# Patient Record
Sex: Male | Born: 1960 | Race: White | Hispanic: No | Marital: Married | Smoking: Never smoker
Health system: Southern US, Community
[De-identification: ages and names within clinical notes are randomized; demographics above are authoritative.]

## PROBLEM LIST (undated history)

## (undated) DIAGNOSIS — K859 Acute pancreatitis without necrosis or infection, unspecified: Secondary | ICD-10-CM

## (undated) DIAGNOSIS — I509 Heart failure, unspecified: Secondary | ICD-10-CM

## (undated) DIAGNOSIS — J45909 Unspecified asthma, uncomplicated: Secondary | ICD-10-CM

## (undated) DIAGNOSIS — E1110 Type 2 diabetes mellitus with ketoacidosis without coma: Secondary | ICD-10-CM

## (undated) DIAGNOSIS — G510 Bell's palsy: Secondary | ICD-10-CM

## (undated) DIAGNOSIS — E119 Type 2 diabetes mellitus without complications: Secondary | ICD-10-CM

## (undated) DIAGNOSIS — I2510 Atherosclerotic heart disease of native coronary artery without angina pectoris: Secondary | ICD-10-CM

## (undated) HISTORY — PX: TRIGGER FINGER RELEASE: SHX641

## (undated) HISTORY — PX: CARDIAC CATHETERIZATION: SHX172

## (undated) HISTORY — PX: CORONARY ANGIOPLASTY WITH STENT PLACEMENT: SHX49

## (undated) HISTORY — PX: ROTATOR CUFF REPAIR: SHX139

---

## 1998-01-19 ENCOUNTER — Encounter: Payer: Self-pay | Admitting: Anesthesiology

## 1998-01-19 ENCOUNTER — Encounter: Admission: RE | Admit: 1998-01-19 | Discharge: 1998-04-07 | Payer: Self-pay | Admitting: Anesthesiology

## 1998-03-30 ENCOUNTER — Encounter: Payer: Self-pay | Admitting: Anesthesiology

## 1998-04-04 ENCOUNTER — Encounter: Admission: RE | Admit: 1998-04-04 | Discharge: 1998-07-03 | Payer: Self-pay | Admitting: Anesthesiology

## 1998-04-07 ENCOUNTER — Encounter: Admission: RE | Admit: 1998-04-07 | Discharge: 1998-07-06 | Payer: Self-pay | Admitting: Anesthesiology

## 1998-04-14 ENCOUNTER — Encounter: Admission: RE | Admit: 1998-04-14 | Discharge: 1998-07-13 | Payer: Self-pay | Admitting: Anesthesiology

## 1998-07-28 ENCOUNTER — Encounter: Admission: RE | Admit: 1998-07-28 | Discharge: 1998-10-26 | Payer: Self-pay | Admitting: Anesthesiology

## 1998-09-16 ENCOUNTER — Encounter: Payer: Self-pay | Admitting: Anesthesiology

## 1998-10-26 ENCOUNTER — Encounter: Admission: RE | Admit: 1998-10-26 | Discharge: 1999-01-20 | Payer: Self-pay | Admitting: Anesthesiology

## 1998-10-26 ENCOUNTER — Encounter: Payer: Self-pay | Admitting: Anesthesiology

## 1999-01-20 ENCOUNTER — Encounter: Admission: RE | Admit: 1999-01-20 | Discharge: 1999-04-07 | Payer: Self-pay | Admitting: Anesthesiology

## 1999-05-25 ENCOUNTER — Encounter: Admission: RE | Admit: 1999-05-25 | Discharge: 1999-08-23 | Payer: Self-pay | Admitting: Anesthesiology

## 1999-11-13 ENCOUNTER — Encounter: Admission: RE | Admit: 1999-11-13 | Discharge: 2000-02-11 | Payer: Self-pay | Admitting: Anesthesiology

## 2000-03-27 ENCOUNTER — Encounter: Admission: RE | Admit: 2000-03-27 | Discharge: 2000-06-25 | Payer: Self-pay | Admitting: Anesthesiology

## 2000-07-23 ENCOUNTER — Encounter: Admission: RE | Admit: 2000-07-23 | Discharge: 2000-09-21 | Payer: Self-pay | Admitting: Anesthesiology

## 2006-12-23 ENCOUNTER — Encounter: Admission: RE | Admit: 2006-12-23 | Discharge: 2007-01-22 | Payer: Self-pay | Admitting: Internal Medicine

## 2007-01-24 ENCOUNTER — Encounter: Admission: RE | Admit: 2007-01-24 | Discharge: 2007-03-03 | Payer: Self-pay | Admitting: Internal Medicine

## 2007-05-30 ENCOUNTER — Ambulatory Visit (HOSPITAL_BASED_OUTPATIENT_CLINIC_OR_DEPARTMENT_OTHER): Admission: RE | Admit: 2007-05-30 | Discharge: 2007-05-30 | Payer: Self-pay | Admitting: Orthopaedic Surgery

## 2009-01-25 ENCOUNTER — Encounter: Admission: RE | Admit: 2009-01-25 | Discharge: 2009-01-25 | Payer: Self-pay | Admitting: Otolaryngology
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
## 2010-06-09 NOTE — H&P (Signed)
[facility]  Patient:    Brent Rasmussen, Brent Rasmussen                     MRN: [id number] Adm. Date:  [id number] Attending:  Thyra Breed CC:         Marinus Maw, M.D.   History and Physical  FOLLOWUP EVALUATION  Brent Rasmussen comes in for followup evaluation of his chronic neck pain on the basis of cervical spondylosis.  Since his previous evaluation, his pain has been relatively stable.  He has had a lot of problems with his appetite and has been switched from Glucophage short acting to long acting.  He has gone off of his Claritin and this actually has helped with his appetite somewhat.  He continues to have intermittent flare ups, although they are much less frequently with headaches and does not feel as though the Percocet is as helpful.  He has been dropped off of the Remeron and continues with the Provigil.  He is very concerned about the cost of his medications.  CURRENT MEDICATIONS:  Provigil, Glucotrol XL, Glucophage XR, Actos, TriCor, OxyContin 20 mg three times a day, Percocet p.r.n., and Naprosyn.  PHYSICAL EXAMINATION:  VITAL SIGNS:  Blood pressure 148/75, heart rate 108, respiratory rate 20, O2 saturation 96%, pain level 5/10.  NECK:  Range of motion is unchanged from previously.  EXTREMITIES:  Deep tendon reflexes were symmetric in the upper extremities. NEUROLOGIC:  Motor is 5/5.  IMPRESSION: 1. Occipital neuralgia with underlying cervical spondylosis with associated    headaches in the occipital region. 2. Other medical problems per Dr. Oneta Rack.  DISPOSITION: 1. OxyContin 20 mg one p.o. q.8h. #90 with no refill. 2. Vioxx 25 mg per day.  He was given samples of this. 3. Lidoderm patches p.r.n. 4. Follow up with me in eight weeks. 5. Continue other medications per Dr. Oneta Rack. DD:  12/18/99 TD:  12/18/99 Job: [id number]

## 2010-06-09 NOTE — Consult Note (Signed)
[facility]  Patient:    Brent Rasmussen, Brent Rasmussen                     MRN: [id number] Proc. Date: 08/14/99 Adm. Date:  [id number] Attending:  Thyra Breed CC:         Brent Rasmussen, M.D.                          Consultation Report  FOLLOW-UP EVALUATION  HISTORY OF PRESENT ILLNESS:  [name] comes in for follow-up evaluation of his right sided headaches on the basis of cervical spondylosis. Since his previous evaluation, the patient has begun seeing Dr. Oneta Rasmussen for his diabetes. He has had Avandia added. He continues to have a lot of neck discomfort and he is trying to shift his OxyContin to b.i.d. rather than t.i.d. but he does note that he has hand pain when he spreads it out. He continues on his other medications unchanged from previously. These include Remeron, Provigil, Glucotrol, glucophage, Avandia, claritin D, OxyContin 20 mg now t.i.d., Percocet p.r.n., nasal spray, pulmicort and glucosamine with Ambien.  PHYSICAL EXAMINATION:  VITAL SIGNS:  Blood pressure is 129/86, heart rate is 100, respiratory rate 16, O2 saturations 96%, pain level is 5/10 and temperature is 98.4.  NECK:  Demonstrates good range of motion with pain on rotation to the right and tenderness to a lesser extent on the right side. Deep tendon reflexes are symmetric in the upper and lower extremity. He has negative Phalen signs and Tinel signs.  IMPRESSION: 1. Occipital neuralgia with underlying cervical spondylosis and associated    headaches. 2. Other medical problems per Dr. Karilyn Rasmussen.  DISPOSITION: 1. Continue on current medications. 2. Mobic 7.5 mg 1 p.o. q.d. #30 with 2 refills. 3. He does not need prescriptions for OxyContin or Percocet today. 4. Followup with me in 3 months. I advised him that if he does not feel    as though the ______ was helping that he could try and go on off of this.    We will hold off on injections for the meantime. DD:  08/14/99 TD:   08/17/99 Job: [id number]

## 2010-06-09 NOTE — H&P (Signed)
Brent Rasmussen  Patient:    Brent Rasmussen                     MRN: [id number] Adm. Date:  [id number] Attending:  Thyra Breed CC:         Marinus Maw, M.D.   History and Physical  HISTORY OF PRESENT ILLNESS:  Brent Rasmussen comes in for followup evaluation of his chronic neck pain on the basis of cervical spondylosis.  Since his previous evaluation, the patient has not noticed a lot of improvement.  He continues on the Roxicet, which he takes up to 1-3 tablets per day, OxyContin 20 mg three times a day, Claritin, Glucotrol, Glucophage, Actos, Tylenol, and Bextra.  He rates his pain at 4/10.  He continues to have pain in the right side of his neck radiating out to his right shoulder.  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  PHYSICAL EXAMINATION:  VITAL SIGNS:  Blood pressure 116/70, heart rate 102, respiratory rate 18.  O2 saturation 97%.  Pain level is 4/10.  NEUROLOGIC:  He demonstrates pain on range of motion of his neck rotating to the right.  Deep tendon reflexes are symmetric in the upper extremities.  He is tender over the posterior aspect of the neck on the right side.  ______ no active synovitis.  He does have an excoriation of his right lower extremity with some erythema about the scab.  IMPRESSION: 1. Cervical spondylosis with occipital neuralgia. 2. Arthralgias of the hands. 3. Other medical problems per Dr. Oneta Rasmussen.  DISPOSITION: 1. Stop OxyContin. 2. Continue with Bextra 10 mg one p.o. q.d. 3. Continue with Lidoderm. 4. Duragesic 50 mcg patch apply one every three days for a trial of two    weeks.  If he is doing well, we will continue at this dose. 5. Percocet 5/325 one to two p.o. q.6h. p.r.n. #180 with no refill. 6. Follow-up with me in six weeks. DD:  06/14/00 TD:  06/15/00 Job: [id number]

## 2010-06-09 NOTE — H&P (Signed)
[facility]  Patient:    Brent Rasmussen, Brent Rasmussen                     MRN: [id number] Proc. Date: 07/24/00 Adm. Date:  [id number] Attending:  Thyra Breed CC:         Dr. Charlotte Sanes   History and Physical  FOLLOW-UP EVALUATION:  HISTORY OF PRESENT ILLNESS:  The patient comes in for followup evaluation of his chronic neck pain on the basis of cervical spondylosis.  The patient is doing much better on the Duragesic down to 1-2 tablets of Percocet per day. Unfortunately he did drop a punch board on his right foot and has some lacerations which occurred two weeks ago, which are improving with no purulent drainage.  He is tolerating the Claritin, Glucotrol, Glucophage, Actos, Bextra, Duragesic and Percocet combination fairly well.  He notes that the Aciphex is helping.  EXAMINATION:  Blood pressure 141/86, heart rate is 107, respiratory rate is 16, O2 saturation is 97%.  Pain level is 4/10.  Neurologic examination is grossly unchanged.  IMPRESSION: 1. Cervical spondylosis with occipital neuralgia. 2. Arthralgia of the hands. 3. Other medical problems per Dr. Charlotte Sanes.  DISPOSITION: 1. Continue on the Duragesic patch 75 mcg every 3 days, #10. 2. Continue with Bextra 10 mg per day.  He was given 2 months samples. 3. Percocet 5/325 1-2 p.o. q.6h. p.r.n., #150 with no refill. 4. Continue with Aciphex.  He was given samples to last him for 2 months    of this also. 5. Followup with me in 6 weeks. DD:  07/24/00 TD:  07/24/00 Job: [id number]

## 2010-06-09 NOTE — H&P (Signed)
[facility]  Patient:    Brent Rasmussen, Brent Rasmussen                     MRN: [id number] Adm. Date:  [id number] Attending:  Thyra Breed CC:         Lilly Cove, M.D.             Dr. Doug Sou                         History and Physical  Patient comes in for follow-up evaluation.  Since his last evaluation, the patient has had his medications adjusted, such that he is taking Remeron at bedtime and he has had _________ added.  He has had three bouts of really bad headaches since his last evaluation which eventually responded to the Percocet.  He has been able to get by with OxyContin twice a day on a regular basis.  He is not taking a nonsteroidal, as he feels that the glucosamine fish oil and pyridoxine combination helps him significantly.  Today he is doing fairly well overall.  PHYSICAL EXAMINATION:  VITAL SIGNS:                  The patient is afebrile.  Vital signs are stable.  HEENT:                        He exhibits tumors over his right greater occipital groove.  NEUROLOGIC:                   Grossly unchanged.  CURRENT MEDICATIONS:           1. Remeron 45 mg q.h.s.                                2. _________ 400 mg a day.                                3. Glucotrol 20 mg per day.                                4. Glucophage 1000 mg b.i.d.                                5. Claritin D.                                6. OxyContin 20 mg b.i.d.                                7. Percocet p.r.n.                                8. Astelin nasal spray.                                9. Pulmicort.  10. _________ Glucosamine, and Ambien.  IMPRESSION:                   1. Headaches and neck discomfort on the basis of                                  cervical spondylosis.                               2. Other medical problems per Dr. _________ and                                  Doug Sou.  DISPOSITION:                  1.  Continue on current medications.  He did not                                  need prescriptions today.                               2. I went ahead and injected his right greater                                  occipital region after prepping with Betadine                                  x 3 and obtain verbal consent.  I injected it                                  with 1 cc of 1% lidocaine with 2 cc of 0.5%                                  levobupivicaine and 10 mg of Medrol.  The                                  patient noted immediate improvement.                               3. Follow up with me in eight weeks.  He is to                                  call if he runs into problems between now and                                  his next appointment.DD:  05/25/99 TD:  05/25/99 Job: [id number]

## 2010-06-09 NOTE — H&P (Signed)
[facility]  Patient:    Brent Rasmussen, Brent Rasmussen                     MRN: [id number] Adm. Date:  [id number] Attending:  Thyra Breed CC:         Marinus Maw, M.D.   History and Physical  This is a followup evaluation.  HISTORY OF PRESENT ILLNESS:  Zaeem comes in for followup evaluation of his cervical spondylosis and polyarthralgias of the small joints of his hands. Since his last evaluation, his basic complaint is his hand pain.  He complains of stiffness in the mornings lasting at least an hour and intermittent swelling of the small joints of his hands.  He does not feel as though the Bextra has been of any benefit in reducing his discomfort.  He also complains of foot discomfort to a lesser extent.  CURRENT MEDICATIONS: 1. Duragesic 75 mcg every 3 days, which he feels only holds him for two days. 2. Prevacid. 3. Aciphex. 4. Bextra.  PHYSICAL EXAMINATION:  VITAL SIGNS:  Blood pressure 158/83, heart rate 107, respiratory rate 18, O2 saturations 97%, pain level 7/10.  NEUROLOGIC:  He demonstrates tenderness of the small joints of hands, but no discrete swelling.  He has good range of motion of his hands, elbows, wrists, and shoulders, as well as the feet.  Neuro examination is grossly unchanged.  IMPRESSION: 1. Polyarthralgias of undetermined etiology. 2. Cervical spondylosis. 3. Dysesthesias likely related to his underlying diabetes. 4. Other medical problems per Dr. Oneta Rack.  DISPOSITION: 1. Continue on Duragesic patch, but increase frequency to every two days #15    with no refill. 2. Stop Bextra. 3. Continue with Percocet. 4. Continue with Aciphex. 5. Arthritis panel with CBC today. 6. Follow up with me in four to six weeks. DD:  09/04/00 TD:  09/04/00 Job: [id number]

## 2010-06-09 NOTE — Consult Note (Signed)
[facility]  Patient:    Brent Rasmussen, Brent Rasmussen                     MRN: [id number] Proc. Date: 04/25/00 Adm. Date:  [id number] Attending:  Thyra Breed CC:         Marinus Maw, M.D.   Consultation Report  FOLLOW-UP EVALUATION  [name] comes in for follow-up today.  He continues to have pain in his hands, feet, and knees which he attributes significantly to more articular pain.  He has not noticed a great deal of change with regard to his head or headaches and neck.  He is currently on Claritin, Roxicet, Glucotrol, Gabitril, OxyContin 20 mg 3 times a day, Ambien, Prilosec, Actos, Pulmicort, and Maxair.  PHYSICAL EXAMINATION:  Blood pressure is 142/80.  Heart rate is 110, respiratory rates 18, O2 saturations 97%.  Pain level is 6/10.  Range of motion of his neck is unchanged.  Examination of his hands and knees reveals no signs of active synovitis today.  Deep tendon reflexes are symmetric in the upper and lower extremities.  IMPRESSION: 1. Occipital neuralgia with underlying cervical spondylosis. 2. Arthralgias of the hands. 3. Other medical problems per Dr. Oneta Rack.  DISPOSITION: 1. Continue on OxyContin 20 mg 1 p.o. q.8h. #90 with no refill. 2. Stop Vioxx. 3. Trial of ______ 10 mg 1 p.o. q.d. 4. Continue with Lidoderm. 5. Roxicodone 15 mg 1 p.o. q.6h. p.r.n. breakthrough pain #100. 6. Follow up with me in eight weeks. DD:  04/25/00 TD:  04/25/00 Job: [id number]

## 2010-06-09 NOTE — H&P (Signed)
[facility]  Patient:    Brent Rasmussen, Brent Rasmussen                     MRN: [id number] Adm. Date:  [id number] Attending:  Thyra Breed CC:         Marinus Maw, M.D.   History and Physical  FOLLOW-UP EVALUATION:  Brent Rasmussen comes in for follow-up evaluation of his neck pain and headaches. Since his last evaluation, his sugars have continued to be difficult to control, and he is worried about going on insulin. He has been started on Lipitor.  He is not sleeping well, despite using the Phenergan at night.  He does not note that the hydrocodone is very helpful but does note that the OxyContin is helping him.  PHYSICAL EXAMINATION:  VITAL SIGNS:  Blood pressure 138/81, heart rate is 115, respiratory rate is 20, O2 saturation is 97%, pain level is 4/10.  NEUROLOGICAL:  Range of motion of his neck is stable with no deterioration from his last visit. He has intact deep tendon reflexes in the upper extremities with strength 5/5. Sensation is intact. He is tender over his right greater occipital region.  CURRENT MEDICATIONS: 1. OxyContin 20 mg q.8h. 2. Hydrocodone 5/500 p.r.n. 3. Vioxx. 4. Glucotrol. 5. Glucophage. 6. Actos. 7. Tricor. 8. Lipitor. 9. Prilosec.  IMPRESSION: 1. Occipital neuralgia with underlying cervical spondylosis. 2. Other medical problems per Marinus Maw, M.D.  DISPOSITION: 1. Continue on OxyContin 20 mg one p.o. q.8h. p.r.n. breakthrough pain. 2. Continue Vioxx. 3. Continue with Lidoderm patches, #30 with two refills. 4. Dilaudid 2 mg one to two p.o. q.4-6h. p.r.n. breakthrough pain, #50 with no    refill. 5. Other medications per Marinus Maw, M.D. 6. Follow up with me in 8 weeks.  The patient was advised that he may not be able to work if he takes the Dilaudid. DD:  02/05/00 TD:  02/05/00 Job: [id number]

## 2014-12-14 ENCOUNTER — Emergency Department (HOSPITAL_BASED_OUTPATIENT_CLINIC_OR_DEPARTMENT_OTHER): Payer: BLUE CROSS/BLUE SHIELD

## 2014-12-14 ENCOUNTER — Encounter (HOSPITAL_BASED_OUTPATIENT_CLINIC_OR_DEPARTMENT_OTHER): Payer: Self-pay | Admitting: *Deleted

## 2014-12-14 ENCOUNTER — Inpatient Hospital Stay (HOSPITAL_BASED_OUTPATIENT_CLINIC_OR_DEPARTMENT_OTHER)
Admission: EM | Admit: 2014-12-14 | Discharge: 2015-01-04 | DRG: 438 | Disposition: A | Discharge status: Home / Self Care | Payer: BLUE CROSS/BLUE SHIELD | Attending: Internal Medicine | Admitting: Internal Medicine

## 2014-12-14 DIAGNOSIS — I5023 Acute on chronic systolic (congestive) heart failure: Secondary | ICD-10-CM | POA: Diagnosis not present

## 2014-12-14 DIAGNOSIS — Z23 Encounter for immunization: Secondary | ICD-10-CM

## 2014-12-14 DIAGNOSIS — I214 Non-ST elevation (NSTEMI) myocardial infarction: Secondary | ICD-10-CM | POA: Diagnosis not present

## 2014-12-14 DIAGNOSIS — R6521 Severe sepsis with septic shock: Secondary | ICD-10-CM | POA: Diagnosis not present

## 2014-12-14 DIAGNOSIS — E1110 Type 2 diabetes mellitus with ketoacidosis without coma: Secondary | ICD-10-CM | POA: Diagnosis present

## 2014-12-14 DIAGNOSIS — E781 Pure hyperglyceridemia: Secondary | ICD-10-CM | POA: Diagnosis present

## 2014-12-14 DIAGNOSIS — K5900 Constipation, unspecified: Secondary | ICD-10-CM | POA: Diagnosis present

## 2014-12-14 DIAGNOSIS — I130 Hypertensive heart and chronic kidney disease with heart failure and stage 1 through stage 4 chronic kidney disease, or unspecified chronic kidney disease: Secondary | ICD-10-CM | POA: Diagnosis present

## 2014-12-14 DIAGNOSIS — Z01818 Encounter for other preprocedural examination: Secondary | ICD-10-CM

## 2014-12-14 DIAGNOSIS — IMO0002 Reserved for concepts with insufficient information to code with codable children: Secondary | ICD-10-CM | POA: Diagnosis present

## 2014-12-14 DIAGNOSIS — E860 Dehydration: Secondary | ICD-10-CM | POA: Diagnosis present

## 2014-12-14 DIAGNOSIS — D696 Thrombocytopenia, unspecified: Secondary | ICD-10-CM | POA: Diagnosis not present

## 2014-12-14 DIAGNOSIS — I472 Ventricular tachycardia: Secondary | ICD-10-CM | POA: Diagnosis not present

## 2014-12-14 DIAGNOSIS — J9601 Acute respiratory failure with hypoxia: Secondary | ICD-10-CM | POA: Diagnosis not present

## 2014-12-14 DIAGNOSIS — E118 Type 2 diabetes mellitus with unspecified complications: Secondary | ICD-10-CM | POA: Diagnosis not present

## 2014-12-14 DIAGNOSIS — E669 Obesity, unspecified: Secondary | ICD-10-CM | POA: Diagnosis present

## 2014-12-14 DIAGNOSIS — I255 Ischemic cardiomyopathy: Secondary | ICD-10-CM | POA: Diagnosis present

## 2014-12-14 DIAGNOSIS — R7989 Other specified abnormal findings of blood chemistry: Secondary | ICD-10-CM | POA: Diagnosis not present

## 2014-12-14 DIAGNOSIS — I2583 Coronary atherosclerosis due to lipid rich plaque: Secondary | ICD-10-CM | POA: Diagnosis present

## 2014-12-14 DIAGNOSIS — E1165 Type 2 diabetes mellitus with hyperglycemia: Secondary | ICD-10-CM | POA: Diagnosis present

## 2014-12-14 DIAGNOSIS — A419 Sepsis, unspecified organism: Secondary | ICD-10-CM | POA: Diagnosis not present

## 2014-12-14 DIAGNOSIS — E871 Hypo-osmolality and hyponatremia: Secondary | ICD-10-CM | POA: Diagnosis present

## 2014-12-14 DIAGNOSIS — Z955 Presence of coronary angioplasty implant and graft: Secondary | ICD-10-CM

## 2014-12-14 DIAGNOSIS — E870 Hyperosmolality and hypernatremia: Secondary | ICD-10-CM | POA: Diagnosis not present

## 2014-12-14 DIAGNOSIS — D509 Iron deficiency anemia, unspecified: Secondary | ICD-10-CM | POA: Diagnosis not present

## 2014-12-14 DIAGNOSIS — Z9289 Personal history of other medical treatment: Secondary | ICD-10-CM

## 2014-12-14 DIAGNOSIS — I248 Other forms of acute ischemic heart disease: Secondary | ICD-10-CM | POA: Diagnosis present

## 2014-12-14 DIAGNOSIS — N179 Acute kidney failure, unspecified: Secondary | ICD-10-CM | POA: Diagnosis present

## 2014-12-14 DIAGNOSIS — I25119 Atherosclerotic heart disease of native coronary artery with unspecified angina pectoris: Secondary | ICD-10-CM | POA: Diagnosis not present

## 2014-12-14 DIAGNOSIS — E873 Alkalosis: Secondary | ICD-10-CM | POA: Diagnosis not present

## 2014-12-14 DIAGNOSIS — R778 Other specified abnormalities of plasma proteins: Secondary | ICD-10-CM | POA: Diagnosis present

## 2014-12-14 DIAGNOSIS — Z79899 Other long term (current) drug therapy: Secondary | ICD-10-CM | POA: Diagnosis not present

## 2014-12-14 DIAGNOSIS — R109 Unspecified abdominal pain: Secondary | ICD-10-CM | POA: Diagnosis present

## 2014-12-14 DIAGNOSIS — Z4659 Encounter for fitting and adjustment of other gastrointestinal appliance and device: Secondary | ICD-10-CM | POA: Diagnosis not present

## 2014-12-14 DIAGNOSIS — T502X5A Adverse effect of carbonic-anhydrase inhibitors, benzothiadiazides and other diuretics, initial encounter: Secondary | ICD-10-CM | POA: Diagnosis not present

## 2014-12-14 DIAGNOSIS — K859 Acute pancreatitis without necrosis or infection, unspecified: Principal | ICD-10-CM

## 2014-12-14 DIAGNOSIS — G92 Toxic encephalopathy: Secondary | ICD-10-CM | POA: Diagnosis not present

## 2014-12-14 DIAGNOSIS — E1310 Other specified diabetes mellitus with ketoacidosis without coma: Secondary | ICD-10-CM | POA: Diagnosis present

## 2014-12-14 DIAGNOSIS — I2510 Atherosclerotic heart disease of native coronary artery without angina pectoris: Secondary | ICD-10-CM | POA: Diagnosis present

## 2014-12-14 DIAGNOSIS — E861 Hypovolemia: Secondary | ICD-10-CM | POA: Diagnosis present

## 2014-12-14 DIAGNOSIS — I5021 Acute systolic (congestive) heart failure: Secondary | ICD-10-CM | POA: Diagnosis not present

## 2014-12-14 DIAGNOSIS — I2582 Chronic total occlusion of coronary artery: Secondary | ICD-10-CM | POA: Diagnosis present

## 2014-12-14 DIAGNOSIS — N189 Chronic kidney disease, unspecified: Secondary | ICD-10-CM | POA: Diagnosis present

## 2014-12-14 DIAGNOSIS — I469 Cardiac arrest, cause unspecified: Secondary | ICD-10-CM | POA: Diagnosis not present

## 2014-12-14 DIAGNOSIS — T383X5A Adverse effect of insulin and oral hypoglycemic [antidiabetic] drugs, initial encounter: Secondary | ICD-10-CM | POA: Diagnosis present

## 2014-12-14 DIAGNOSIS — T45512A Poisoning by anticoagulants, intentional self-harm, initial encounter: Secondary | ICD-10-CM

## 2014-12-14 DIAGNOSIS — Z6834 Body mass index (BMI) 34.0-34.9, adult: Secondary | ICD-10-CM | POA: Diagnosis not present

## 2014-12-14 DIAGNOSIS — G928 Other toxic encephalopathy: Secondary | ICD-10-CM | POA: Diagnosis present

## 2014-12-14 DIAGNOSIS — I252 Old myocardial infarction: Secondary | ICD-10-CM | POA: Diagnosis not present

## 2014-12-14 DIAGNOSIS — R571 Hypovolemic shock: Secondary | ICD-10-CM

## 2014-12-14 DIAGNOSIS — I4729 Other ventricular tachycardia: Secondary | ICD-10-CM

## 2014-12-14 DIAGNOSIS — Z7984 Long term (current) use of oral hypoglycemic drugs: Secondary | ICD-10-CM | POA: Diagnosis not present

## 2014-12-14 DIAGNOSIS — R079 Chest pain, unspecified: Secondary | ICD-10-CM | POA: Diagnosis not present

## 2014-12-14 DIAGNOSIS — N170 Acute kidney failure with tubular necrosis: Secondary | ICD-10-CM | POA: Diagnosis not present

## 2014-12-14 DIAGNOSIS — E876 Hypokalemia: Secondary | ICD-10-CM | POA: Diagnosis not present

## 2014-12-14 DIAGNOSIS — J811 Chronic pulmonary edema: Secondary | ICD-10-CM | POA: Diagnosis present

## 2014-12-14 DIAGNOSIS — E0810 Diabetes mellitus due to underlying condition with ketoacidosis without coma: Secondary | ICD-10-CM | POA: Diagnosis not present

## 2014-12-14 HISTORY — DX: Acute pancreatitis without necrosis or infection, unspecified: K85.90

## 2014-12-14 HISTORY — DX: Type 2 diabetes mellitus without complications: E11.9

## 2014-12-14 HISTORY — DX: Bell's palsy: G51.0

## 2014-12-14 HISTORY — DX: Unspecified asthma, uncomplicated: J45.909

## 2014-12-14 HISTORY — DX: Atherosclerotic heart disease of native coronary artery without angina pectoris: I25.10

## 2014-12-14 HISTORY — DX: Type 2 diabetes mellitus with ketoacidosis without coma: E11.10

## 2014-12-14 LAB — I-STAT VENOUS BLOOD GAS, ED
Acid-base deficit: 23 mmol/L — ABNORMAL HIGH (ref 0.0–2.0)
Acid-base deficit: 21 mmol/L — ABNORMAL HIGH (ref 0.0–2.0)
Bicarbonate: 6 mEq/L — ABNORMAL LOW (ref 20.0–24.0)
Bicarbonate: 5.1 mEq/L — ABNORMAL LOW (ref 20.0–24.0)
O2 SAT: 93 %
O2 Saturation: 62 %
PCO2 VEN: 14.2 mmHg — AB (ref 45.0–50.0)
PH VEN: 7.165 — AB (ref 7.250–7.300)
TCO2: 7 mmol/L (ref 0–100)
TCO2: 6 mmol/L (ref 0–100)
pCO2, Ven: 23.1 mmHg — ABNORMAL LOW (ref 45.0–50.0)
pH, Ven: 7.026 — CL (ref 7.250–7.300)
pO2, Ven: 46 mmHg — ABNORMAL HIGH (ref 30.0–45.0)
pO2, Ven: 80 mmHg — ABNORMAL HIGH (ref 30.0–45.0)

## 2014-12-14 LAB — COMPREHENSIVE METABOLIC PANEL
ALBUMIN: 4.4 g/dL (ref 3.5–5.0)
ALT: 22 U/L (ref 17–63)
AST: 32 U/L (ref 15–41)
Alkaline Phosphatase: 73 U/L (ref 38–126)
Anion gap: 29 — ABNORMAL HIGH (ref 5–15)
BUN: 37 mg/dL — AB (ref 6–20)
CHLORIDE: 93 mmol/L — AB (ref 101–111)
CO2: 6 mmol/L — ABNORMAL LOW (ref 22–32)
CREATININE: 1.87 mg/dL — AB (ref 0.61–1.24)
Calcium: 9.8 mg/dL (ref 8.9–10.3)
GFR calc Af Amer: 45 mL/min — ABNORMAL LOW (ref >60)
GFR, EST NON AFRICAN AMERICAN: 39 mL/min — AB (ref >60)
GLUCOSE: 485 mg/dL — AB (ref 65–99)
Potassium: 4.2 mmol/L (ref 3.5–5.1)
Sodium: 128 mmol/L — ABNORMAL LOW (ref 135–145)
Total Bilirubin: 1.9 mg/dL — ABNORMAL HIGH (ref 0.3–1.2)
Total Protein: 8.3 g/dL — ABNORMAL HIGH (ref 6.5–8.1)

## 2014-12-14 LAB — LIPASE, BLOOD

## 2014-12-14 LAB — URINE MICROSCOPIC-ADD ON: WBC, UA: NONE SEEN WBC/hpf (ref 0–5)

## 2014-12-14 LAB — CBC WITH DIFFERENTIAL/PLATELET
BAND NEUTROPHILS: 2 %
BASOS PCT: 0 %
Basophils Absolute: 0 10*3/uL (ref 0.0–0.1)
EOS PCT: 0 %
Eosinophils Absolute: 0 10*3/uL (ref 0.0–0.7)
HEMATOCRIT: 50.5 % (ref 39.0–52.0)
HEMOGLOBIN: 17.7 g/dL — AB (ref 13.0–17.0)
LYMPHS ABS: 1.7 10*3/uL (ref 0.7–4.0)
Lymphocytes Relative: 11 %
MCH: 29.2 pg (ref 26.0–34.0)
MCHC: 35 g/dL (ref 30.0–36.0)
MCV: 83.2 fL (ref 78.0–100.0)
MONOS PCT: 8 %
Monocytes Absolute: 1.2 10*3/uL — ABNORMAL HIGH (ref 0.1–1.0)
NEUTROS ABS: 12.5 10*3/uL — AB (ref 1.7–7.7)
Neutrophils Relative %: 79 %
Platelets: 262 10*3/uL (ref 150–400)
RBC: 6.07 MIL/uL — AB (ref 4.22–5.81)
RDW: 15.5 % (ref 11.5–15.5)
WBC: 15.4 10*3/uL — ABNORMAL HIGH (ref 4.0–10.5)

## 2014-12-14 LAB — PROTIME-INR
INR: 1.02 (ref 0.00–1.49)
PROTHROMBIN TIME: 13.6 s (ref 11.6–15.2)

## 2014-12-14 LAB — URINALYSIS, ROUTINE W REFLEX MICROSCOPIC
Glucose, UA: >1000 mg/dL — AB
Ketones, ur: >80 mg/dL — AB
LEUKOCYTES UA: NEGATIVE
NITRITE: NEGATIVE
PH: 5.5 (ref 5.0–8.0)
Protein, ur: 100 mg/dL — AB
SPECIFIC GRAVITY, URINE: 1.029 (ref 1.005–1.030)

## 2014-12-14 LAB — I-STAT CG4 LACTIC ACID, ED
LACTIC ACID, VENOUS: 2.36 mmol/L — AB (ref 0.5–2.0)
Lactic Acid, Venous: 6.59 mmol/L (ref 0.5–2.0)

## 2014-12-14 LAB — CBG MONITORING, ED
GLUCOSE-CAPILLARY: 490 mg/dL — AB (ref 65–99)
GLUCOSE-CAPILLARY: 380 mg/dL — AB (ref 65–99)
Glucose-Capillary: 395 mg/dL — ABNORMAL HIGH (ref 65–99)

## 2014-12-14 LAB — TROPONIN I

## 2014-12-14 MED ORDER — ASPIRIN 81 MG PO CHEW
324.0000 mg | CHEWABLE_TABLET | ORAL | Status: AC
  Administered 2014-12-14: 324 mg via ORAL
  Filled 2014-12-14: qty 4

## 2014-12-14 MED ORDER — SODIUM CHLORIDE 0.9 % IV SOLN: 250.0000 mL | INTRAVENOUS | Status: DC | PRN

## 2014-12-14 MED ORDER — HEPARIN SODIUM (PORCINE) 5000 UNIT/ML IJ SOLN
5000.0000 [IU] | Freq: Three times a day (TID) | INTRAMUSCULAR | Status: DC
  Administered 2014-12-14 – 2014-12-18 (×11): 5000 [IU] via SUBCUTANEOUS
  Filled 2014-12-14 (×17): qty 1

## 2014-12-14 MED ORDER — ONDANSETRON HCL 4 MG/2ML IJ SOLN
4.0000 mg | Freq: Once | INTRAMUSCULAR | Status: AC
  Administered 2014-12-14: 4 mg via INTRAVENOUS
  Filled 2014-12-14: qty 2

## 2014-12-14 MED ORDER — ASPIRIN 300 MG RE SUPP: 300.0000 mg | RECTAL | Status: AC

## 2014-12-14 MED ORDER — DEXTROSE-NACL 5-0.45 % IV SOLN
INTRAVENOUS | Status: DC
  Administered 2014-12-15 – 2014-12-16 (×3): via INTRAVENOUS

## 2014-12-14 MED ORDER — DEXTROSE-NACL 5-0.45 % IV SOLN: INTRAVENOUS | Status: DC

## 2014-12-14 MED ORDER — SODIUM CHLORIDE 0.9 % IV BOLUS (SEPSIS)
500.0000 mL | INTRAVENOUS | Status: AC
  Administered 2014-12-14: 500 mL via INTRAVENOUS

## 2014-12-14 MED ORDER — SODIUM CHLORIDE 0.9 % IV BOLUS (SEPSIS)
1000.0000 mL | INTRAVENOUS | Status: AC
  Administered 2014-12-14 (×3): 1000 mL via INTRAVENOUS

## 2014-12-14 MED ORDER — SODIUM CHLORIDE 0.9 % IV SOLN
Freq: Once | INTRAVENOUS | Status: AC
  Administered 2014-12-14: 21:00:00 via INTRAVENOUS

## 2014-12-14 MED ORDER — FENTANYL CITRATE (PF) 100 MCG/2ML IJ SOLN
50.0000 ug | Freq: Once | INTRAMUSCULAR | Status: AC
  Administered 2014-12-14: 50 ug via INTRAVENOUS
  Filled 2014-12-14: qty 2

## 2014-12-14 MED ORDER — METOCLOPRAMIDE HCL 5 MG/ML IJ SOLN
5.0000 mg | Freq: Four times a day (QID) | INTRAMUSCULAR | Status: DC | PRN
  Administered 2014-12-15 (×2): 5 mg via INTRAVENOUS
  Filled 2014-12-14 (×4): qty 1

## 2014-12-14 MED ORDER — ONDANSETRON HCL 4 MG/2ML IJ SOLN
4.0000 mg | Freq: Once | INTRAMUSCULAR | Status: AC
  Administered 2014-12-14: 4 mg via INTRAVENOUS

## 2014-12-14 MED ORDER — SODIUM CHLORIDE 0.9 % IV SOLN: INTRAVENOUS | Status: DC

## 2014-12-14 MED ORDER — FENTANYL CITRATE (PF) 100 MCG/2ML IJ SOLN
25.0000 ug | INTRAMUSCULAR | Status: DC | PRN
  Administered 2014-12-14: 25 ug via INTRAVENOUS
  Filled 2014-12-14: qty 2

## 2014-12-14 MED ORDER — SODIUM CHLORIDE 0.9 % IV SOLN
INTRAVENOUS | Status: DC
  Administered 2014-12-14: 3.2 [IU]/h via INTRAVENOUS
  Administered 2014-12-15: 7.7 [IU]/h via INTRAVENOUS
  Administered 2014-12-15 (×2): 13.3 [IU]/h via INTRAVENOUS
  Administered 2014-12-15: 11.1 [IU]/h via INTRAVENOUS
  Administered 2014-12-15: 6.2 [IU]/h via INTRAVENOUS
  Administered 2014-12-17: 4 [IU]/h via INTRAVENOUS
  Filled 2014-12-14 (×3): qty 2.5

## 2014-12-14 MED ORDER — ONDANSETRON HCL 4 MG/2ML IJ SOLN
INTRAMUSCULAR | Status: AC
  Administered 2014-12-14: 4 mg via INTRAVENOUS
  Filled 2014-12-14: qty 2

## 2014-12-14 MED ORDER — POTASSIUM CHLORIDE 10 MEQ/100ML IV SOLN: 10.0000 meq | INTRAVENOUS | Status: DC

## 2014-12-14 MED ORDER — PIPERACILLIN-TAZOBACTAM 3.375 G IVPB 30 MIN
3.3750 g | Freq: Once | INTRAVENOUS | Status: AC
  Administered 2014-12-14: 3.375 g via INTRAVENOUS
  Filled 2014-12-14 (×2): qty 50

## 2014-12-14 NOTE — ED Notes (Signed)
Family at bedside. 

## 2014-12-14 NOTE — H&P (Signed)
PULMONARY / CRITICAL CARE MEDICINE   Name: Brent Rasmussen MRN: [id number] DOB: 1960/05/30    ADMISSION DATE:  12/14/2014 CONSULTATION DATE:  12/14/14  REFERRING MD :  [facility]  CHIEF COMPLAINT:  DKA, pancreatitis  INITIAL PRESENTATION: Brent Rasmussen is a 54 y.o. male that presented to [facility] for abdominal pain.  He was found to have pancreatitis and be in DKA.  There he was provided 3.5 L bolus.  He was started on an insulin drip and was transferred to [facility] for care.   STUDIES:  11/22 MRI brain: No acute processes 11/22 CT abdomen: Moderate to severe acute pancreatitis without fluid collection, abscess or pseudocyst.  SIGNIFICANT EVENTS: 11/22 Transferred to [facility] from [facility]   HISTORY OF PRESENT ILLNESS:    Patient reports that he began having abdominal pain and nausea on Saturday.  He reports that pain progressively worsened.  He was seen at urgent care where he was told he had a renal infection.  Pain worsened further today and he went to [facility] for evaluation.  CT abdomen revealed an acute pancreatitis.  Patient was also found to have a metabolic acidosis, presumably from DKA.  He reports poor compliance with BG checking.  He reports taking Metformin and Invokana at home.  Denies diarrhea, hematemesis, alcohol use.  Endorses constipation, nausea, vomiting, poor BY MOUTH intake.  PAST MEDICAL HISTORY :   has a past medical history of Diabetes mellitus without complication ([place]); Coronary artery disease; Asthma; and Bell's palsy.  has past surgical history that includes Cardiac catheterization and Coronary angioplasty with stent. Prior to Admission medications   Medication Sig Start Date End Date Taking? Authorizing Provider  Atorvastatin Calcium (LIPITOR PO) Take by mouth.   Yes Historical Provider, MD  Canagliflozin (INVOKANA PO) Take by mouth.   Yes Historical Provider, MD  METFORMIN HCL PO Take by mouth.   Yes  Historical Provider, MD   Allergies  Allergen Reactions  . Sulfa Antibiotics     FAMILY HISTORY:  has no family status information on file.  SOCIAL HISTORY:    REVIEW OF SYSTEMS:  Per HPI  SUBJECTIVE:   VITAL SIGNS: Temp:  [94 F (34.4 C)-96.3 F (35.7 C)] 95.9 F (35.5 C) (11/22 2151) Pulse Rate:  [114-126] 126 (11/22 2151) Resp:  [20-26] 24 (11/22 2151) BP: (111-127)/(70-96) 111/78 mmHg (11/22 2151) SpO2:  [96 %-100 %] 99 % (11/22 2151) Weight:  [230 lb (104.327 kg)] 230 lb (104.327 kg) (11/22 1819) HEMODYNAMICS:   VENTILATOR SETTINGS:   INTAKE / OUTPUT:  Intake/Output Summary (Last 24 hours) at 12/14/14 2321 Last data filed at 12/14/14 1848  Gross per 24 hour  Intake   1000 ml  Output      0 ml  Net   1000 ml    PHYSICAL EXAMINATION: General:  Awake, alert, NAD, wife at bedside Neuro:  Follows commands, no focal deficits HEENT:  [place]/AT, MM mildly dry Cardiovascular:  RRR, no murmurs Lungs:  CTAB, no wheeze, [place] in place Abdomen:  Protuberant, +BS, +epigastric tenderness to palpation, no peritoneal signs. Musculoskeletal:  Moves all extremities, WWP, no edema Skin:  Several healing excoriations on anterior shins bilaterally.  LABS:  CBC  Recent Labs Lab 12/14/14 1810  WBC 15.4*  HGB 17.7*  HCT 50.5  PLT 262   Coag's  Recent Labs Lab 12/14/14 1810  INR 1.02   BMET  Recent Labs Lab 12/14/14 1810  NA 128*  K 4.2  CL 93*  CO2 6*  BUN 37*  CREATININE 1.87*  GLUCOSE 485*   Electrolytes  Recent Labs Lab 12/14/14 1810  CALCIUM 9.8   Sepsis Markers  Recent Labs Lab 12/14/14 1826 12/14/14 2023  LATICACIDVEN 6.59* 2.36*   ABG No results for input(s): PHART, PCO2ART, PO2ART in the last 168 hours. Liver Enzymes  Recent Labs Lab 12/14/14 1810  AST 32  ALT 22  ALKPHOS 73  BILITOT 1.9*  ALBUMIN 4.4   Cardiac Enzymes  Recent Labs Lab 12/14/14 1810  TROPONINI <0.03   Glucose  Recent Labs Lab 12/14/14 1817  12/14/14 2009 12/14/14 2133  GLUCAP 490* 380* 395*    Imaging Ct Abdomen Pelvis Wo Contrast  12/14/2014  CLINICAL DATA:  Altered mental status, reason UTI, hypothermia, diabetes, elevated lactate EXAM: CT ABDOMEN AND PELVIS WITHOUT CONTRAST TECHNIQUE: Multidetector CT imaging of the abdomen and pelvis was performed following the standard protocol without IV contrast. COMPARISON:  None. FINDINGS: Lower chest:  No acute findings. Hepatobiliary: No mass visualized on this un-enhanced exam. Pancreas: Diffuse peripancreatic stranding edema evident which also extends along the retroperitoneum, duodenum, and transverse mesocolon. Findings are compatible with moderate to severe acute pancreatitis. No associated fluid collection, pseudocyst or abscess. No ductal dilatation. Spleen: Within normal limits in size. Adrenals/Urinary Tract: Normal adrenal glands. Minor nonspecific perinephric strandy edema without obstructing urinary tract or ureteral calculus. Stomach/Bowel: Slight thickening of the duodenum adjacent to the pancreas, suspect mild secondary duodenitis. No associated obstruction, ileus, or free air. Vascular/Lymphatic: No adenopathy. Negative for aneurysm. Minor atherosclerosis of the bifurcation and iliac vessels. Reproductive: No mass or other significant abnormality. Other: No inguinal abnormality all are hernia. Musculoskeletal:  Minor degenerative changes of the lumbar spine. IMPRESSION: Moderate to severe acute pancreatitis without fluid collection, abscess or pseudocyst. Mild wall thickening of the adjacent duodenum, suspect reactive duodenitis secondary to the adjacent inflammatory process. No associated bowel obstruction or free air Electronically Signed   By: Jerilynn Mages.  Shick M.D.   On: 12/14/2014 19:36   Ct Head Wo Contrast  12/14/2014  CLINICAL DATA:  Patient with altered mental status. Recent diagnosis urinary tract infection. EXAM: CT HEAD WITHOUT CONTRAST TECHNIQUE: Contiguous axial images were  obtained from the base of the skull through the vertex without intravenous contrast. COMPARISON:  MRI brain 01/25/2009. FINDINGS: Ventricles and sulci are appropriate for patient's age. No evidence for acute cortically based infarct, intracranial hemorrhage, mass lesion mass-effect. Orbits are unremarkable. Paranasal sinuses are well aerated. Mastoid air cells are unremarkable. Calvarium is intact. IMPRESSION: No acute intracranial process. Electronically Signed   By: Lovey Newcomer M.D.   On: 12/14/2014 19:30   Dg Chest Port 1 View  12/14/2014  CLINICAL DATA:  Altered Mental status, Elevated Blood/sugar/ Fever. HX: diabetes, CAD, Asthma, Cardiac Catherization EXAM: PORTABLE CHEST - 1 VIEW COMPARISON:  01/08/2010 FINDINGS: Low lung volumes. No focal infiltrate or overt edema. Heart size normal. No pneumothorax. No effusion. Visualized skeletal structures are unremarkable. IMPRESSION: Low lung volumes.  No acute cardiopulmonary disease. Electronically Signed   By: Lucrezia Europe M.D.   On: 12/14/2014 19:31   ASSESSMENT / PLAN:  PULMONARY No acute  P:   O2 as needed.  CARDIOVASCULAR BP 101/73 P:  Monitor  RENAL AKI in the setting of DKA/ dehydration, Cr 2.99 Metabolic acidosis, anion gap (AG 29 on admission) + non anion gap met acidosis K 4.2 at [facility] P:   Repeat BMET now, then q4 S/p 3.5L IVFs, MIVFs as  below Monitor K and replete as needed  GASTROINTESTINAL Acute pancreatitis, Lipase >3000, TGs 3651 P:   NPO IVFs Fentanyl 25 mcg q2 as needed Reglan 5 mg as needed Serial abdominal exams  HEMATOLOGIC Mild leukocytosis 14.6 Hemoconcentration hgb 17.8 P:  Repeat CBC in am  INFECTIOUS Acute pancreatitis but no evidence of perforation or abscess on CT S/p 1 dose of Zosyn at [facility] Afebrile P:   BCx2 pending  Abx: 1 dose Zosyn, start date 11/22 PCT protocol No additional antibiotics at this time Will monitor  ENDOCRINE H/o diabetes type 2 DKA, with AG  acidosis P:   Glucose stabilizer protocol, currently on insulin gtt IFVs, switch to d5 1/2NS when glucose <250 Will transition to sub q insulin when gap closes and bg <250 x4 BMPs as above q1 BG checks  NEUROLOGIC Metabolic encephalopathy, improving with fluids CT head negative for acute processes P:   Monitor  FAMILY  - Updates: Patient and wife updated at bedside  - Inter-disciplinary family meet or Palliative Care meeting due by:  12/22/14  Ashly M. Lajuana Ripple, DO PGY-2, [facility] 12/14/2014, 11:21 PM  Attending Note:  54 year old male presenting to [facility] for abdominal pain, was noted to have pancreatitis with DKA with significant acidosis.  Deteriorating renal function.  Patient was also noted to have triglyceride of 3600.  Transferred patient to ICU in [facility].  The patient was started on an insulin drip.  On exam his lungs are clear.  Labs with worsening Cr.  I reviewed the CT myself, no evidence of pseudocyst or necrosis so no need for abx.  Will initiate DKA protocol and repeat triglyceride level and lipase level in AM.  Once DKA is treated and triglyceride level drops then will stop insulin.  Will need repeat imaging of the abdomen in 72 hours to determine if a cyst or necrosis appears.  The patient is critically ill with multiple organ systems failure and requires high complexity decision making for assessment and support, frequent evaluation and titration of therapies, application of advanced monitoring technologies and extensive interpretation of multiple databases.   Critical Care Time devoted to patient care services described in this note is  35  Minutes. This time reflects time of care of this signee Dr Jennet Maduro. This critical care time does not reflect procedure time, or teaching time or supervisory time of PA/NP/Med student/Med Resident etc but could involve care discussion time.  Rush Farmer, M.D. [facility] Pulmonary/Critical Care  Medicine. Pager: [phone]. After hours pager: [phone].

## 2014-12-14 NOTE — ED Notes (Signed)
Wife of patient states the patient has c/o nausea, lack of appetite and fatigue for the last three days.  Was seen by his PCP yesterday and diagnosed with a kidney infection and started on cipro.  States the patient has a history of diabetes, and has his insulin stopped, and only on pills for his dm.  Wife states today, she came home from work and patient has been unable to speak to her and his speech is slurred.

## 2014-12-14 NOTE — ED Notes (Signed)
[facility]

## 2014-12-14 NOTE — ED Notes (Signed)
Transported to CT via stretcher with this RN.

## 2014-12-14 NOTE — ED Provider Notes (Signed)
CSN: [id number]     Arrival date & time 12/14/14  1810 History  By signing my name below, I, Octavia Heir, attest that this documentation has been prepared under the direction and in the presence of Tilden Fossa, MD. Electronically Signed: Octavia Heir, ED Scribe. 12/14/2014. 6:44 PM.     Chief Complaint  Patient presents with  . Shortness of Breath      The history is provided by the patient and the spouse. No language interpreter was used.   HPI Comments: Brent Rasmussen is a 54 y.o. male who has a hx of DM, Bells Palsy and heart stent placed presents to the Emergency Department complaining of intermittent, gradual worsening right sided abdominal pain onset 3 days ago. Pt reports he has been vomiting bile and has been having nausea. Pt was seen at an Urgent Care yesterday and was diagnosed with a kidney infection. Wife notes that the pt has gotten gradually worse since then. She reports pt has been unable to speak to her and he has also been having slurred speech. He denies black stool, bloody stool, hx of abdominal surgery, and use of street drugs. Pt is a non-smoker. He was started on ciprofloxacin.  Sxs are severe, constant, worsening.  He also endorses midline thoracic back pain.    No past medical history on file. No past surgical history on file. No family history on file. Social History  Substance Use Topics  . Smoking status: Not on file  . Smokeless tobacco: Not on file  . Alcohol Use: Not on file    Review of Systems  Gastrointestinal: Positive for nausea, vomiting and abdominal pain. Negative for blood in stool.  All other systems reviewed and are negative.     Allergies  Review of patient's allergies indicates not on file.  Home Medications   Prior to Admission medications   Not on File   Triage vitals: BP 118/96 mmHg  Pulse 118  Temp(Src) 94.6 F (34.8 C) (Rectal)  Resp 20  Ht  (1.778 m)  Wt 230 lb (104.327 kg)  BMI 33.00 kg/m2  SpO2  99% Physical Exam  Constitutional: He is oriented to person, place, and time. He appears well-developed and well-nourished. He appears distressed.  HENT:  Head: Normocephalic and atraumatic.  Eyes: Pupils are equal, round, and reactive to light.  Cardiovascular: Regular rhythm.   No murmur heard. tachcyardic  Pulmonary/Chest: Breath sounds normal. No respiratory distress.  tachypneic  Abdominal: Soft. There is no rebound and no guarding.  Moderate upper abdominal tenderness, greatest over RUQ  Musculoskeletal: He exhibits no edema or tenderness.  Neurological: He is oriented to person, place, and time.  Lethargic.  Left sided facial droop.  5/5 strength in all four extremities.    Skin: Skin is warm and dry. There is pallor.  Psychiatric: He has a normal mood and affect. His behavior is normal.  Nursing note and vitals reviewed.   ED Course  Procedures  CRITICAL CARE Performed by: Tilden Fossa   Total critical care time: 45 minutes  Critical care time was exclusive of separately billable procedures and treating other patients.  Critical care was necessary to treat or prevent imminent or life-threatening deterioration.  Critical care was time spent personally by me on the following activities: development of treatment plan with patient and/or surrogate as well as nursing, discussions with consultants, evaluation of patient's response to treatment, examination of patient, obtaining history from patient or surrogate, ordering and performing treatments and interventions, ordering  and review of laboratory studies, ordering and review of radiographic studies, pulse oximetry and re-evaluation of patient's condition.  DIAGNOSTIC STUDIES: Oxygen Saturation is 99% on RA, normal by my interpretation.  COORDINATION OF CARE:  6:28 PM Discussed treatment plan which includes lab work with pt at bedside and pt agreed to plan.  Labs Review Labs Reviewed  COMPREHENSIVE METABOLIC PANEL -  Abnormal; Notable for the following:    Sodium 128 (*)    Chloride 93 (*)    CO2 6 (*)    Glucose, Bld 485 (*)    BUN 37 (*)    Creatinine, Ser 1.87 (*)    Total Protein 8.3 (*)    Total Bilirubin 1.9 (*)    GFR calc non Af Amer 39 (*)    GFR calc Af Amer 45 (*)    Anion gap 29 (*)    All other components within normal limits  CBC WITH DIFFERENTIAL/PLATELET - Abnormal; Notable for the following:    WBC 15.4 (*)    RBC 6.07 (*)    Hemoglobin 17.7 (*)    Neutro Abs 12.5 (*)    Monocytes Absolute 1.2 (*)    All other components within normal limits  URINALYSIS, ROUTINE W REFLEX MICROSCOPIC (NOT AT [facility]) - Abnormal; Notable for the following:    APPearance CLOUDY (*)    Glucose, UA >1000 (*)    Hgb urine dipstick MODERATE (*)    Bilirubin Urine MODERATE (*)    Ketones, ur >80 (*)    Protein, ur 100 (*)    All other components within normal limits  LIPASE, BLOOD - Abnormal; Notable for the following:    Lipase >3000 (*)    All other components within normal limits  URINE MICROSCOPIC-ADD ON - Abnormal; Notable for the following:    Squamous Epithelial / LPF 6-30 (*)    Bacteria, UA RARE (*)    All other components within normal limits  I-STAT VENOUS BLOOD GAS, ED - Abnormal; Notable for the following:    pH, Ven 7.026 (*)    pCO2, Ven 23.1 (*)    pO2, Ven 46.0 (*)    Bicarbonate 6.0 (*)    Acid-base deficit 23.0 (*)    All other components within normal limits  I-STAT CG4 LACTIC ACID, ED - Abnormal; Notable for the following:    Lactic Acid, Venous 6.59 (*)    All other components within normal limits  CBG MONITORING, ED - Abnormal; Notable for the following:    Glucose-Capillary 490 (*)    All other components within normal limits  CBG MONITORING, ED - Abnormal; Notable for the following:    Glucose-Capillary 380 (*)    All other components within normal limits  I-STAT VENOUS BLOOD GAS, ED - Abnormal; Notable for the following:    pH, Ven 7.165 (*)    pCO2, Ven 14.2  (*)    pO2, Ven 80.0 (*)    Bicarbonate 5.1 (*)    Acid-base deficit 21.0 (*)    All other components within normal limits  I-STAT CG4 LACTIC ACID, ED - Abnormal; Notable for the following:    Lactic Acid, Venous 2.36 (*)    All other components within normal limits  CBG MONITORING, ED - Abnormal; Notable for the following:    Glucose-Capillary 395 (*)    All other components within normal limits  CULTURE, BLOOD (ROUTINE X 2)  CULTURE, BLOOD (ROUTINE X 2)  URINE CULTURE  TROPONIN I  PROTIME-INR  BLOOD GAS,  VENOUS  LACTIC ACID, PLASMA  LACTIC ACID, PLASMA  LIPID PANEL    Imaging Review Ct Abdomen Pelvis Wo Contrast  12/14/2014  CLINICAL DATA:  Altered mental status, reason UTI, hypothermia, diabetes, elevated lactate EXAM: CT ABDOMEN AND PELVIS WITHOUT CONTRAST TECHNIQUE: Multidetector CT imaging of the abdomen and pelvis was performed following the standard protocol without IV contrast. COMPARISON:  None. FINDINGS: Lower chest:  No acute findings. Hepatobiliary: No mass visualized on this un-enhanced exam. Pancreas: Diffuse peripancreatic stranding edema evident which also extends along the retroperitoneum, duodenum, and transverse mesocolon. Findings are compatible with moderate to severe acute pancreatitis. No associated fluid collection, pseudocyst or abscess. No ductal dilatation. Spleen: Within normal limits in size. Adrenals/Urinary Tract: Normal adrenal glands. Minor nonspecific perinephric strandy edema without obstructing urinary tract or ureteral calculus. Stomach/Bowel: Slight thickening of the duodenum adjacent to the pancreas, suspect mild secondary duodenitis. No associated obstruction, ileus, or free air. Vascular/Lymphatic: No adenopathy. Negative for aneurysm. Minor atherosclerosis of the bifurcation and iliac vessels. Reproductive: No mass or other significant abnormality. Other: No inguinal abnormality all are hernia. Musculoskeletal:  Minor degenerative changes of the  lumbar spine. IMPRESSION: Moderate to severe acute pancreatitis without fluid collection, abscess or pseudocyst. Mild wall thickening of the adjacent duodenum, suspect reactive duodenitis secondary to the adjacent inflammatory process. No associated bowel obstruction or free air Electronically Signed   By: Judie Petit.  Shick M.D.   On: 12/14/2014 19:36   Ct Head Wo Contrast  12/14/2014  CLINICAL DATA:  Patient with altered mental status. Recent diagnosis urinary tract infection. EXAM: CT HEAD WITHOUT CONTRAST TECHNIQUE: Contiguous axial images were obtained from the base of the skull through the vertex without intravenous contrast. COMPARISON:  MRI brain 01/25/2009. FINDINGS: Ventricles and sulci are appropriate for patient's age. No evidence for acute cortically based infarct, intracranial hemorrhage, mass lesion mass-effect. Orbits are unremarkable. Paranasal sinuses are well aerated. Mastoid air cells are unremarkable. Calvarium is intact. IMPRESSION: No acute intracranial process. Electronically Signed   By: Annia Belt M.D.   On: 12/14/2014 19:30   Dg Chest Port 1 View  12/14/2014  CLINICAL DATA:  Altered Mental status, Elevated Blood/sugar/ Fever. HX: diabetes, CAD, Asthma, Cardiac Catherization EXAM: PORTABLE CHEST - 1 VIEW COMPARISON:  01/08/2010 FINDINGS: Low lung volumes. No focal infiltrate or overt edema. Heart size normal. No pneumothorax. No effusion. Visualized skeletal structures are unremarkable. IMPRESSION: Low lung volumes.  No acute cardiopulmonary disease. Electronically Signed   By: Corlis Leak M.D.   On: 12/14/2014 19:31   I have personally reviewed and evaluated these images and lab results as part of my medical decision-making.   EKG Interpretation   Date/Time:  Tuesday December 14 2014 18:18:16 EST Ventricular Rate:  118 PR Interval:  162 QRS Duration: 102 QT Interval:  336 QTC Calculation: 470 R Axis:   76 Text Interpretation:  Sinus tachycardia Nonspecific ST abnormality   Abnormal ECG Confirmed by Lincoln Brigham [phone]) on 12/14/2014 6:30:57 PM      MDM   Final diagnoses:  Acute pancreatitis, unspecified pancreatitis type   Patient with history of coronary artery disease and diabetes here for evaluation of abdominal pain, vomiting, back pain. On initial examination. Patient critically ill with tachypnea and lethargy. Sepsis protocol was initiated with initial concern for possible intra-abdominal infection. On repeat evaluation following IV fluid administration patient's mental status considerably improved, he was much more alert, stating he was feeling improved. Studies demonstrate acute pancreatitis with profound metabolic acidosis. Discussed with critical care, plan  to admit for further management. Discussed with patient and wife findings studies and need for further treatment. They're in agreement with admission.   I personally performed the services described in this documentation, which was scribed in my presence. The recorded information has been reviewed and is accurate.    Tilden Fossa, MD 12/14/14 2328

## 2014-12-14 NOTE — ED Notes (Signed)
Pt c/o nausea.  

## 2014-12-14 NOTE — ED Notes (Signed)
Pt back in room.

## 2014-12-14 NOTE — ED Notes (Signed)
Increased blanket warmer due to Pt. Temp 94.4 rectal.  Pt. Is A&O .  Lungs are clear bilat.  Speech is clear.  Pt. Reports his back hurts.  RN attempts to make Pt. As comfortable as poss.

## 2014-12-15 DIAGNOSIS — K859 Acute pancreatitis without necrosis or infection, unspecified: Secondary | ICD-10-CM | POA: Diagnosis present

## 2014-12-15 DIAGNOSIS — E0810 Diabetes mellitus due to underlying condition with ketoacidosis without coma: Secondary | ICD-10-CM

## 2014-12-15 DIAGNOSIS — I469 Cardiac arrest, cause unspecified: Secondary | ICD-10-CM

## 2014-12-15 DIAGNOSIS — J9601 Acute respiratory failure with hypoxia: Secondary | ICD-10-CM

## 2014-12-15 LAB — LIPID PANEL
CHOLESTEROL: 773 mg/dL — AB (ref 0–200)
LDL Cholesterol: UNDETERMINED mg/dL (ref 0–99)
TRIGLYCERIDES: 3651 mg/dL — AB (ref <150)
VLDL: UNDETERMINED mg/dL (ref 0–40)

## 2014-12-15 LAB — BASIC METABOLIC PANEL
ANION GAP: 11 (ref 5–15)
ANION GAP: 12 (ref 5–15)
ANION GAP: 12 (ref 5–15)
ANION GAP: 17 — AB (ref 5–15)
ANION GAP: 23 — AB (ref 5–15)
Anion gap: 13 (ref 5–15)
BUN: 38 mg/dL — ABNORMAL HIGH (ref 6–20)
BUN: 54 mg/dL — AB (ref 6–20)
BUN: 59 mg/dL — AB (ref 6–20)
BUN: 46 mg/dL — ABNORMAL HIGH (ref 6–20)
BUN: 51 mg/dL — ABNORMAL HIGH (ref 6–20)
BUN: 53 mg/dL — ABNORMAL HIGH (ref 6–20)
CALCIUM: 7.2 mg/dL — AB (ref 8.9–10.3)
CALCIUM: 7.7 mg/dL — AB (ref 8.9–10.3)
CALCIUM: 7.8 mg/dL — AB (ref 8.9–10.3)
CALCIUM: 8.4 mg/dL — AB (ref 8.9–10.3)
CO2: 11 mmol/L — AB (ref 22–32)
CO2: 11 mmol/L — ABNORMAL LOW (ref 22–32)
CO2: 11 mmol/L — ABNORMAL LOW (ref 22–32)
CO2: 12 mmol/L — ABNORMAL LOW (ref 22–32)
CO2: 6 mmol/L — AB (ref 22–32)
CO2: 8 mmol/L — ABNORMAL LOW (ref 22–32)
CREATININE: 2.9 mg/dL — AB (ref 0.61–1.24)
CREATININE: 3.41 mg/dL — AB (ref 0.61–1.24)
Calcium: 6.8 mg/dL — ABNORMAL LOW (ref 8.9–10.3)
Calcium: 6.6 mg/dL — ABNORMAL LOW (ref 8.9–10.3)
Chloride: 104 mmol/L (ref 101–111)
Chloride: 108 mmol/L (ref 101–111)
Chloride: 107 mmol/L (ref 101–111)
Chloride: 106 mmol/L (ref 101–111)
Chloride: 106 mmol/L (ref 101–111)
Chloride: 107 mmol/L (ref 101–111)
Creatinine, Ser: 2.39 mg/dL — ABNORMAL HIGH (ref 0.61–1.24)
Creatinine, Ser: 2.29 mg/dL — ABNORMAL HIGH (ref 0.61–1.24)
Creatinine, Ser: 2.37 mg/dL — ABNORMAL HIGH (ref 0.61–1.24)
Creatinine, Ser: 2.69 mg/dL — ABNORMAL HIGH (ref 0.61–1.24)
GFR, EST AFRICAN AMERICAN: 22 mL/min — AB (ref >60)
GFR, EST AFRICAN AMERICAN: 27 mL/min — AB (ref >60)
GFR, EST AFRICAN AMERICAN: 29 mL/min — AB (ref >60)
GFR, EST AFRICAN AMERICAN: 34 mL/min — AB (ref >60)
GFR, EST AFRICAN AMERICAN: 34 mL/min — AB (ref >60)
GFR, EST AFRICAN AMERICAN: 36 mL/min — AB (ref >60)
GFR, EST NON AFRICAN AMERICAN: 19 mL/min — AB (ref >60)
GFR, EST NON AFRICAN AMERICAN: 23 mL/min — AB (ref >60)
GFR, EST NON AFRICAN AMERICAN: 25 mL/min — AB (ref >60)
GFR, EST NON AFRICAN AMERICAN: 29 mL/min — AB (ref >60)
GFR, EST NON AFRICAN AMERICAN: 29 mL/min — AB (ref >60)
GFR, EST NON AFRICAN AMERICAN: 31 mL/min — AB (ref >60)
GLUCOSE: 172 mg/dL — AB (ref 65–99)
Glucose, Bld: 426 mg/dL — ABNORMAL HIGH (ref 65–99)
Glucose, Bld: 284 mg/dL — ABNORMAL HIGH (ref 65–99)
Glucose, Bld: 199 mg/dL — ABNORMAL HIGH (ref 65–99)
Glucose, Bld: 151 mg/dL — ABNORMAL HIGH (ref 65–99)
Glucose, Bld: 177 mg/dL — ABNORMAL HIGH (ref 65–99)
POTASSIUM: 3.8 mmol/L (ref 3.5–5.1)
POTASSIUM: 4 mmol/L (ref 3.5–5.1)
Potassium: 3.9 mmol/L (ref 3.5–5.1)
Potassium: 4.3 mmol/L (ref 3.5–5.1)
Potassium: 3.7 mmol/L (ref 3.5–5.1)
Potassium: 3.6 mmol/L (ref 3.5–5.1)
SODIUM: 130 mmol/L — AB (ref 135–145)
SODIUM: 129 mmol/L — AB (ref 135–145)
SODIUM: 131 mmol/L — AB (ref 135–145)
Sodium: 133 mmol/L — ABNORMAL LOW (ref 135–145)
Sodium: 133 mmol/L — ABNORMAL LOW (ref 135–145)
Sodium: 129 mmol/L — ABNORMAL LOW (ref 135–145)

## 2014-12-15 LAB — URINE CULTURE: CULTURE: NO GROWTH

## 2014-12-15 LAB — CBC
HCT: 45.8 % (ref 39.0–52.0)
HCT: 48.9 % (ref 39.0–52.0)
Hemoglobin: 16.3 g/dL (ref 13.0–17.0)
Hemoglobin: 17.8 g/dL — ABNORMAL HIGH (ref 13.0–17.0)
MCH: 29.7 pg (ref 26.0–34.0)
MCH: 30.9 pg (ref 26.0–34.0)
MCHC: 35.6 g/dL (ref 30.0–36.0)
MCHC: 36.4 g/dL — AB (ref 30.0–36.0)
MCV: 83.6 fL (ref 78.0–100.0)
MCV: 84.9 fL (ref 78.0–100.0)
Platelets: 133 10*3/uL — ABNORMAL LOW (ref 150–400)
Platelets: 198 10*3/uL (ref 150–400)
RBC: 5.48 MIL/uL (ref 4.22–5.81)
RBC: 5.76 MIL/uL (ref 4.22–5.81)
RDW: 14.5 % (ref 11.5–15.5)
RDW: 14.6 % (ref 11.5–15.5)
WBC: 11.3 10*3/uL — ABNORMAL HIGH (ref 4.0–10.5)
WBC: 14.6 10*3/uL — ABNORMAL HIGH (ref 4.0–10.5)

## 2014-12-15 LAB — TRIGLYCERIDES: Triglycerides: 1070 mg/dL — ABNORMAL HIGH (ref <150)

## 2014-12-15 LAB — BLOOD GAS, ARTERIAL
Acid-base deficit: 14.4 mmol/L — ABNORMAL HIGH (ref 0.0–2.0)
Bicarbonate: 10.5 mEq/L — ABNORMAL LOW (ref 20.0–24.0)
Drawn by: 29017
O2 Saturation: 95.6 %
Patient temperature: 98.6
TCO2: 11.2 mmol/L (ref 0–100)
pCO2 arterial: 20.8 mmHg — ABNORMAL LOW (ref 35.0–45.0)
pH, Arterial: 7.326 — ABNORMAL LOW (ref 7.350–7.450)
pO2, Arterial: 101 mmHg — ABNORMAL HIGH (ref 80.0–100.0)

## 2014-12-15 LAB — GLUCOSE, CAPILLARY
GLUCOSE-CAPILLARY: 403 mg/dL — AB (ref 65–99)
GLUCOSE-CAPILLARY: 190 mg/dL — AB (ref 65–99)
GLUCOSE-CAPILLARY: 136 mg/dL — AB (ref 65–99)
GLUCOSE-CAPILLARY: 144 mg/dL — AB (ref 65–99)
Glucose-Capillary: 262 mg/dL — ABNORMAL HIGH (ref 65–99)
Glucose-Capillary: 230 mg/dL — ABNORMAL HIGH (ref 65–99)
Glucose-Capillary: 291 mg/dL — ABNORMAL HIGH (ref 65–99)
Glucose-Capillary: 380 mg/dL — ABNORMAL HIGH (ref 65–99)
Glucose-Capillary: 392 mg/dL — ABNORMAL HIGH (ref 65–99)
Glucose-Capillary: 394 mg/dL — ABNORMAL HIGH (ref 65–99)
Glucose-Capillary: 158 mg/dL — ABNORMAL HIGH (ref 65–99)
Glucose-Capillary: 141 mg/dL — ABNORMAL HIGH (ref 65–99)
Glucose-Capillary: 165 mg/dL — ABNORMAL HIGH (ref 65–99)
Glucose-Capillary: 179 mg/dL — ABNORMAL HIGH (ref 65–99)
Glucose-Capillary: 158 mg/dL — ABNORMAL HIGH (ref 65–99)

## 2014-12-15 LAB — MRSA PCR SCREENING: MRSA by PCR: NEGATIVE

## 2014-12-15 LAB — LIPASE, BLOOD

## 2014-12-15 MED ORDER — SODIUM BICARBONATE 8.4 % IV SOLN
INTRAVENOUS | Status: DC
  Administered 2014-12-15 (×2): via INTRAVENOUS
  Filled 2014-12-15 (×5): qty 100

## 2014-12-15 MED ORDER — SODIUM CHLORIDE 0.9 % IV BOLUS (SEPSIS)
500.0000 mL | Freq: Once | INTRAVENOUS | Status: AC
  Administered 2014-12-15: 500 mL via INTRAVENOUS

## 2014-12-15 MED ORDER — INFLUENZA VAC SPLIT QUAD 0.5 ML IM SUSY
0.5000 mL | PREFILLED_SYRINGE | INTRAMUSCULAR | Status: DC
  Filled 2014-12-15: qty 0.5

## 2014-12-15 MED ORDER — FENTANYL CITRATE (PF) 100 MCG/2ML IJ SOLN
75.0000 ug | INTRAMUSCULAR | Status: DC | PRN
  Administered 2014-12-15 – 2014-12-17 (×9): 75 ug via INTRAVENOUS
  Filled 2014-12-15 (×9): qty 2

## 2014-12-15 MED ORDER — PANTOPRAZOLE SODIUM 40 MG IV SOLR
40.0000 mg | INTRAVENOUS | Status: DC
  Administered 2014-12-15 – 2014-12-21 (×7): 40 mg via INTRAVENOUS
  Filled 2014-12-15 (×10): qty 40

## 2014-12-15 MED ORDER — FENTANYL CITRATE (PF) 100 MCG/2ML IJ SOLN
50.0000 ug | Freq: Once | INTRAMUSCULAR | Status: AC
  Administered 2014-12-15: 50 ug via INTRAVENOUS
  Filled 2014-12-15: qty 2

## 2014-12-15 MED ORDER — SODIUM CHLORIDE 0.9 % IV BOLUS (SEPSIS)
1000.0000 mL | Freq: Once | INTRAVENOUS | Status: AC
  Administered 2014-12-15: 1000 mL via INTRAVENOUS

## 2014-12-15 MED ORDER — SODIUM CHLORIDE 0.45 % IV SOLN: INTRAVENOUS | Status: DC

## 2014-12-15 NOTE — Code Documentation (Signed)
CODE BLUE NOTE  Patient Name: Brent Rasmussen   MRN: [id number]   Date of Birth/ Sex: 10-02-1960 , male      Admission Date: 12/14/2014  Attending Provider: Nelda Bucksaniel J Feinstein, MD  Primary Diagnosis: <principal problem not specified>    Indication: Pt was in his usual state of health until this PM, when he was noted to be pale, weak, and diaphoretic. Code blue was subsequently called. At the time of arrival on scene, ACLS protocol was underway.    Technical Description:  - CPR performance duration:  1  minute  - Was defibrillation or cardioversion used? No   - Was external pacer placed? No  - Was patient intubated pre/post CPR? No    Medications Administered: Y = Yes; Blank = No Amiodarone    Atropine    Calcium    Epinephrine    Lidocaine    Magnesium    Norepinephrine    Phenylephrine    Sodium bicarbonate    Vasopressin      Post CPR evaluation:  - Final Status - Was patient successfully resuscitated ? Yes - What is current rhythm? Sinus  - What is current hemodynamic status? Stable   Miscellaneous Information:  - Labs sent, including: None  - Primary team notified?  Yes  - Family Notified? Yes  - Additional notes/ transfer status: Transferred to ICU        Campbell StallKaty Dodd Mayo, MD  12/15/2014, 11:13 PM

## 2014-12-15 NOTE — Progress Notes (Signed)
Report called to nurse. Patient is stable. Patient on room air. Wife at bedside.

## 2014-12-15 NOTE — Progress Notes (Signed)
Called by rapid response nurse, patient evidently stood up to defecate then lost consciousness and was eased down into the bed.  Pulse was checked and patient had none.  Code was called and CPR was started x2 minutes.  With CPR ROSC after two minutes.  Patient was not responsive and was moved to the ICU for intubation and closer monitoring, SBP was 60 mmHg.  Upon arrival to the ICU patient's BP improved to 90's and mental status improved some.  Given 500 ml IVF NS and mental status improved significantly.  Was able to follow commands and cough and protect his airway.  Decision made at that point not to intubate the patient but will increase fluid resuscitation and order CBC/CMET/Mg/Phos/Lipase and follow up.  The patient is critically ill with multiple organ systems failure and requires high complexity decision making for assessment and support, frequent evaluation and titration of therapies, application of advanced monitoring technologies and extensive interpretation of multiple databases.   Critical Care Time devoted to patient care services described in this note is  45  Minutes. This time reflects time of care of this signee Dr Koren BoundWesam Alyssha Housh. This critical care time does not reflect procedure time, or teaching time or supervisory time of PA/NP/Med student/Med Resident etc but could involve care discussion time.  Brent ReedyWesam G. Bera Rasmussen, M.D. [facility]. Pager: [phone]. After hours pager: [phone].

## 2014-12-15 NOTE — Progress Notes (Signed)
Inpatient Diabetes Program Recommendations  AACE/ADA: New Consensus Statement on Inpatient Glycemic Control (2015)  Target Ranges:  Prepandial:   less than 140 mg/dL      Peak postprandial:   less than 180 mg/dL (1-2 hours)      Critically ill patients:  140 - 180 mg/dL  Results for Brent Rasmussen, Brent Rasmussen (MRN [id number]) as of 12/15/2014 08:42  Ref. Range 12/15/2014 02:09 12/15/2014 03:12 12/15/2014 04:15 12/15/2014 05:18 12/15/2014 06:21  Glucose-Capillary Latest Ref Range: 65-99 mg/dL 394 (H) 291 (H) 262 (H) 230 (H) 190 (H)   Review of Glycemic Control  Diabetes history: DM2 Outpatient Diabetes medications: According to office note dated 12/13/14 by Dr. Deforest Hoyles (with [facility]) patient is prescribed: Metformin 1000 mg BID, Invokana 300 mg daily, Victoza 1.2 mg daily (appears patient was recently started on Invokana and Victoza) Current orders for Inpatient glycemic control: Insulin drip per DKA protocol  Inpatient Diabetes Program Recommendations: IV insulin: Patient is currently on IV insulin per DKA protocol and according to labs drawn 12/15/14 at 3:45 am patient is still acidotic (CO2 8 and AG 17). Insulin drip should be continued until acidosis is cleared and DKA parameters are met. HgbA1C: Please order an A1C to evaluate glycemic control over the past 2-3 months.   Thanks, Barnie Alderman, RN, MSN, CDE Diabetes Coordinator Inpatient Diabetes Program [phone] (Team Pager from [place] to [place]) [phone] ([place] office) [phone] [facility] office) [phone] [facility] office)

## 2014-12-15 NOTE — Care Management Note (Signed)
Case Management Note Donn PieriniKristi Ladarrious Kirksey RN, BSN Unit 2W-Case Manager [phone] Covering 84M  Patient Details  Name: Brent ShamesDaniel J Vidovich MRN: [id number] Date of Birth: 1960/11/08  Subjective/Objective:   Pt admitted with DKA and pancreatitis                 Action/Plan: PTA pt lived at home- NCM to follow for d/c needs- no PCP listed may need assistance with primary care.   Expected Discharge Date:                  Expected Discharge Plan:  Home/Self Care  In-House Referral:     Discharge planning Services  CM Consult  Post Acute Care Choice:    Choice offered to:     DME Arranged:    DME Agency:     HH Arranged:    HH Agency:     Status of Service:  In process, will continue to follow  Medicare Important Message Given:    Date Medicare IM Given:    Medicare IM give by:    Date Additional Medicare IM Given:    Additional Medicare Important Message give by:     If discussed at Long Length of Stay Meetings, dates discussed:    Additional Comments:  Darrold SpanWebster, Graylin Sperling Hall, RN 12/15/2014, 8:29 AM

## 2014-12-15 NOTE — Procedures (Signed)
CPR Procedure Note  Cardiac arrest likely vagal on the toilet.  CPR x2 minutes.  Please see code sheet for details.  Alyson ReedyWesam G. See Beharry, M.D. [facility]. Pager: [phone]. After hours pager: [phone].

## 2014-12-15 NOTE — Progress Notes (Signed)
Sepsis - Repeat Assessment  Performed at:    12:52 am  Vitals     Blood pressure 101/73, pulse 127, temperature 95.7 F (35.4 C), temperature source Core (Comment), resp. rate 26, height 5\' 10"  (1.778 m), weight 104.327 kg (230 lb), SpO2 99 %.  Heart:     Tachycardic  Lungs:    CTA  Capillary Refill:   <2 sec  Peripheral Pulse:   Posterior tibialis pulse  palpable  Skin:     Pale    Will continue IVFs and DKA management.  Per Dr Molli KnockYacoub water by mouth ok.  No sugared drinks.  Ashly M. Nadine CountsGottschalk, DO PGY-2, [facility]

## 2014-12-15 NOTE — Progress Notes (Signed)
Chaplain responded to Code [employer] page for pt in 2C13. No family was present. Pt survived the code and will be moved to ICU. I understand from RN that pt's wife has been called.

## 2014-12-15 NOTE — Progress Notes (Signed)
PULMONARY / CRITICAL CARE MEDICINE   Name:Kaian Allen KellJ Saia ZOX:[id number] DOB:[birth year]   ADMISSION DATE: 12/14/2014 CONSULTATION DATE: 12/14/14  REFERRING MD : [facility]  CHIEF COMPLAINT: DKA, pancreatitis  INITIAL PRESENTATION: Brent Rasmussen is a 54 y.o. male that presented to [facility] for abdominal pain. He was found to have pancreatitis and be in DKA. There he was provided 3.5 L bolus. He was started on an insulin drip and [facility]   STUDIES:  11/22 MRI brain: No acute processes 11/22 CT abdomen: Moderate to severe acute pancreatitis without fluid collection, abscess or pseudocyst.  SIGNIFICANT EVENTS: 11/22 [facility]  SUBJECTIVE:  Nausea improving with reglan. Patient is asking for diet sprite this am. Tolerated water well overnight. Abdominal pain a little better with Fentanyl 75 but still painful.   VITAL SIGNS: Temp: [94 F (34.4 C)-99.8 F (37.7 C)] 97.1 F (36.2 C) (11/23 0416) Pulse Rate: [114-135] 126 (11/23 0430) Resp: [20-33] 30 (11/23 0430) BP: (62-145)/(49-129) 95/74 mmHg (11/23 0430) SpO2: [96 %-100 %] 98 % (11/23 0430) Weight: [230 lb (104.327 kg)-231 lb 4.2 oz (104.9 kg)] 231 lb 4.2 oz (104.9 kg) (11/23 0415) HEMODYNAMICS:   VENTILATOR SETTINGS:   INTAKE / OUTPUT:  Intake/Output Summary (Last 24 hours) at 12/15/14 0452 Last data filed at 12/15/14 0400  Gross per 24 hour  Intake 3711.57 ml  Output  50 ml  Net 3661.57 ml    PHYSICAL EXAMINATION: General: Awake, alert, NAD, lying in bed with clothing removed Neuro: Follows commands, no focal deficits HEENT: [place]/AT, MMM Cardiovascular: RRR, no murmurs Lungs: CTAB, no wheeze, breathing well on RA Abdomen: Protuberant, +BS, +epigastric tenderness to palpation, no peritoneal signs. Musculoskeletal: Moves all extremities, WWP, no edema Skin: Several healing  excoriations on anterior shins bilaterally.  LABS:  CBC  Last Labs      Recent Labs Lab 12/14/14 1810 12/14/14 2351 12/15/14 0345  WBC 15.4* 14.6* 11.3*  HGB 17.7* 17.8* 16.3  HCT 50.5 48.9 45.8  PLT 262 198 133*     Coag's  Last Labs      Recent Labs Lab 12/14/14 1810  INR 1.02     BMET  Last Labs      Recent Labs Lab 12/14/14 1810 12/14/14 2357  NA 128* 133*  K 4.2 4.0  CL 93* 104  CO2 6* 6*  BUN 37* 38*  CREATININE 1.87* 2.39*  GLUCOSE 485* 426*     Electrolytes  Last Labs      Recent Labs Lab 12/14/14 1810 12/14/14 2357  CALCIUM 9.8 8.4*     Sepsis Markers  Last Labs      Recent Labs Lab 12/14/14 1826 12/14/14 2023  LATICACIDVEN 6.59* 2.36*     ABG  Last Labs      Recent Labs Lab 12/15/14 0354  PHART 7.326*  PCO2ART 20.8*  PO2ART 101*     Liver Enzymes  Last Labs      Recent Labs Lab 12/14/14 1810  AST 32  ALT 22  ALKPHOS 73  BILITOT 1.9*  ALBUMIN 4.4     Cardiac Enzymes  Last Labs      Recent Labs Lab 12/14/14 1810  TROPONINI <0.03     Glucose  Last Labs      Recent Labs Lab 12/14/14 1817 12/14/14 2009 12/14/14 2133 12/15/14 0415  GLUCAP 490* 380* 395* 262*      Imaging Ct Abdomen Pelvis Wo Contrast  12/14/2014 CLINICAL DATA: Altered mental status, reason UTI, hypothermia, diabetes, elevated lactate EXAM: CT ABDOMEN AND PELVIS WITHOUT CONTRAST TECHNIQUE: Multidetector CT imaging of the abdomen and pelvis was performed following the standard protocol without IV contrast. COMPARISON: None. FINDINGS: Lower chest: No acute findings. Hepatobiliary: No mass visualized on this un-enhanced exam. Pancreas: Diffuse peripancreatic stranding edema evident which also extends along the retroperitoneum, duodenum, and transverse mesocolon. Findings are compatible with moderate to severe acute pancreatitis. No associated  fluid collection, pseudocyst or abscess. No ductal dilatation. Spleen: Within normal limits in size. Adrenals/Urinary Tract: Normal adrenal glands. Minor nonspecific perinephric strandy edema without obstructing urinary tract or ureteral calculus. Stomach/Bowel: Slight thickening of the duodenum adjacent to the pancreas, suspect mild secondary duodenitis. No associated obstruction, ileus, or free air. Vascular/Lymphatic: No adenopathy. Negative for aneurysm. Minor atherosclerosis of the bifurcation and iliac vessels. Reproductive: No mass or other significant abnormality. Other: No inguinal abnormality all are hernia. Musculoskeletal: Minor degenerative changes of the lumbar spine. IMPRESSION: Moderate to severe acute pancreatitis without fluid collection, abscess or pseudocyst. Mild wall thickening of the adjacent duodenum, suspect reactive duodenitis secondary to the adjacent inflammatory process. No associated bowel obstruction or free air Electronically Signed By: Judie Petit. Shick M.D. On: 12/14/2014 19:36   Ct Head Wo Contrast  12/14/2014 CLINICAL DATA: Patient with altered mental status. Recent diagnosis urinary tract infection. EXAM: CT HEAD WITHOUT CONTRAST TECHNIQUE: Contiguous axial images were obtained from the base of the skull through the vertex without intravenous contrast. COMPARISON: MRI brain 01/25/2009. FINDINGS: Ventricles and sulci are appropriate for patient's age. No evidence for acute cortically based infarct, intracranial hemorrhage, mass lesion mass-effect. Orbits are unremarkable. Paranasal sinuses are well aerated. Mastoid air cells are unremarkable. Calvarium is intact. IMPRESSION: No acute intracranial process. Electronically Signed By: Annia Belt M.D. On: 12/14/2014 19:30   Dg Chest Port 1 View  12/14/2014 CLINICAL DATA: Altered Mental status, Elevated Blood/sugar/ Fever. HX: diabetes, CAD, Asthma, Cardiac Catherization EXAM: PORTABLE CHEST - 1 VIEW COMPARISON:  01/08/2010 FINDINGS: Low lung volumes. No focal infiltrate or overt edema. Heart size normal. No pneumothorax. No effusion. Visualized skeletal structures are unremarkable. IMPRESSION: Low lung volumes. No acute cardiopulmonary disease. Electronically Signed By: Corlis Leak M.D. On: 12/14/2014 19:31   ASSESSMENT / PLAN:  PULMONARY At risk ARDS  P:  O2 as needed pcxr in am   CARDIOVASCULAR BP 98/62 P:  Monitor Keep MAP >65 Allow pos balance LActic reduced  RENAL AKI in the setting of DKA/ dehydration, Cr 1.87>2.29 Metabolic acidosis, anion gap almost resolved, NONAG rising from saline K stable at 3.9  R/o RTA 4 P:  Dc saline BMP q4 Add bicarb for nON AG IVFs as below Monitor K and replete as needed  GASTROINTESTINAL Mild- moderate Acute pancreatitis, Lipase 3000, TGs 3651>1070 P:  NPO, except water, hold advancing to full,l can do clears IVFs Fentanyl 75 mcg q1 as needed pain Reglan 5 mg as needed Follow abdominal exam Repeat lipase pending  HEMATOLOGIC Mild leukocytosis 11.3 dvt prevention P:  Repeat CBC in am Sub q heparin   INFECTIOUS Acute pancreatitis but no evidence of perforation, abscess or pseudocyst on CT so no antibiotics needed for now S/p [facility] Afebrile P:  BCx2 pending  Abx: 1 dose Zosyn, start date 11/22 PCT protocol No additional antibiotics at this time Will monitor  ENDOCRINE H/o diabetes type 2 DKA, with AG acidosis, AG 29> 17 P:  Glucose stabilizer protocol, currently on insulin gtt, goal to  dc this over next 4 hrs Will transition to sub q insulin when gap closes and bg <250 x4 BMPs as above q1 BG checks  NEUROLOGIC Metabolic encephalopathy, improving with fluids CT head negative for acute processes P:  Monitor pain  FAMILY  - Updates: Patient and wife updated at bedside  - Inter-disciplinary family meet or Palliative Care meeting due by: 12/22/14  Ashly M. Nadine Counts,  DO PGY-2, [facility] 12/15/2014, 4:52 AM  STAFF NOTE: Cindi Carbon, MD FACP have personally reviewed patient's available data, including medical history, events of note, physical examination and test results as part of my evaluation. I have discussed with resident/NP and other care providers such as pharmacist, RN and RRT. In addition, I personally evaluated patient and elicited key findings of: abdo soft, no r/g, CT reviewed neg abscess, TG causing lipase most likely cause, lungs clear, AG is almost closed , now NONAG is larger issue, add bicarb, avoid saline, avoiding d5w for now with dka, no fevers, no abscess, no abx indicated at this state, TG to follow in am , lipase pending this am, continued bmets q4h, goal is to dc insulin drip this afternoon, no re image needed, advance to clears, control pain, pcxr in am for risk ARDS, IS as able, to chair The patient is critically ill with multiple organ systems failure and requires high complexity decision making for assessment and support, frequent evaluation and titration of therapies, application of advanced monitoring technologies and extensive interpretation of multiple databases.   Critical Care Time devoted to patient care services described in this note is 30 Minutes. This time reflects time of care of this signee: Rory Percy, MD FACP. This critical care time does not reflect procedure time, or teaching time or supervisory time of PA/NP/Med student/Med Resident etc but could involve care discussion time. Rest per NP/medical resident whose note is outlined above and that I agree with   Mcarthur Rossetti. Tyson Alias, MD, FACP Pgr: [phone] [place] [facility] 12/15/2014 7:19 AM

## 2014-12-15 NOTE — Progress Notes (Deleted)
PULMONARY / CRITICAL CARE MEDICINE   Name: LEAF KERNODLE MRN: [id number] DOB: Nov 20, 1960    ADMISSION DATE:  12/14/2014 CONSULTATION DATE:  12/14/14  REFERRING MD :  [facility]  CHIEF COMPLAINT:  DKA, pancreatitis  INITIAL PRESENTATION: Brent Rasmussen is a 54 y.o. male that presented to [facility] for abdominal pain.  He was found to have pancreatitis and be in DKA.  There he was provided 3.5 L bolus.  He was started on an insulin drip and was transferred to [facility] for care.   STUDIES:  11/22 MRI brain: No acute processes 11/22 CT abdomen: Moderate to severe acute pancreatitis without fluid collection, abscess or pseudocyst.  SIGNIFICANT EVENTS: 11/22 Transferred to [facility] from [facility]  SUBJECTIVE:  Nausea improving with reglan.  Patient is asking for diet sprite this am.  Tolerated water well overnight.  Abdominal pain a little better with Fentanyl 75 but still painful.   VITAL SIGNS: Temp:  [94 F (34.4 C)-99.8 F (37.7 C)] 97.1 F (36.2 C) (11/23 0416) Pulse Rate:  [114-135] 126 (11/23 0430) Resp:  [20-33] 30 (11/23 0430) BP: (62-145)/(49-129) 95/74 mmHg (11/23 0430) SpO2:  [96 %-100 %] 98 % (11/23 0430) Weight:  [230 lb (104.327 kg)-231 lb 4.2 oz (104.9 kg)] 231 lb 4.2 oz (104.9 kg) (11/23 0415) HEMODYNAMICS:   VENTILATOR SETTINGS:   INTAKE / OUTPUT:  Intake/Output Summary (Last 24 hours) at 12/15/14 0452 Last data filed at 12/15/14 0400  Gross per 24 hour  Intake 3711.57 ml  Output     50 ml  Net 3661.57 ml    PHYSICAL EXAMINATION: General:  Awake, alert, NAD, lying in bed with clothing removed Neuro:  Follows commands, no focal deficits HEENT:  [place]/AT, MMM Cardiovascular:  RRR, no murmurs Lungs:  CTAB, no wheeze, breathing well on RA Abdomen:  Protuberant, +BS, +epigastric tenderness to palpation, no peritoneal signs. Musculoskeletal:  Moves all extremities, WWP, no edema Skin:  Several healing excoriations on anterior  shins bilaterally.  LABS:  CBC  Recent Labs Lab 12/14/14 1810 12/14/14 2351 12/15/14 0345  WBC 15.4* 14.6* 11.3*  HGB 17.7* 17.8* 16.3  HCT 50.5 48.9 45.8  PLT 262 198 133*   Coag's  Recent Labs Lab 12/14/14 1810  INR 1.02   BMET  Recent Labs Lab 12/14/14 1810 12/14/14 2357  NA 128* 133*  K 4.2 4.0  CL 93* 104  CO2 6* 6*  BUN 37* 38*  CREATININE 1.87* 2.39*  GLUCOSE 485* 426*   Electrolytes  Recent Labs Lab 12/14/14 1810 12/14/14 2357  CALCIUM 9.8 8.4*   Sepsis Markers  Recent Labs Lab 12/14/14 1826 12/14/14 2023  LATICACIDVEN 6.59* 2.36*   ABG  Recent Labs Lab 12/15/14 0354  PHART 7.326*  PCO2ART 20.8*  PO2ART 101*   Liver Enzymes  Recent Labs Lab 12/14/14 1810  AST 32  ALT 22  ALKPHOS 73  BILITOT 1.9*  ALBUMIN 4.4   Cardiac Enzymes  Recent Labs Lab 12/14/14 1810  TROPONINI <0.03   Glucose  Recent Labs Lab 12/14/14 1817 12/14/14 2009 12/14/14 2133 12/15/14 0415  GLUCAP 490* 380* 395* 262*    Imaging Ct Abdomen Pelvis Wo Contrast  12/14/2014  CLINICAL DATA:  Altered mental status, reason UTI, hypothermia, diabetes, elevated lactate EXAM: CT ABDOMEN AND PELVIS WITHOUT CONTRAST TECHNIQUE: Multidetector CT imaging of the abdomen and pelvis was performed following the standard protocol without IV contrast. COMPARISON:  None. FINDINGS: Lower chest:  No acute findings. Hepatobiliary:  No mass visualized on this un-enhanced exam. Pancreas: Diffuse peripancreatic stranding edema evident which also extends along the retroperitoneum, duodenum, and transverse mesocolon. Findings are compatible with moderate to severe acute pancreatitis. No associated fluid collection, pseudocyst or abscess. No ductal dilatation. Spleen: Within normal limits in size. Adrenals/Urinary Tract: Normal adrenal glands. Minor nonspecific perinephric strandy edema without obstructing urinary tract or ureteral calculus. Stomach/Bowel: Slight thickening of the  duodenum adjacent to the pancreas, suspect mild secondary duodenitis. No associated obstruction, ileus, or free air. Vascular/Lymphatic: No adenopathy. Negative for aneurysm. Minor atherosclerosis of the bifurcation and iliac vessels. Reproductive: No mass or other significant abnormality. Other: No inguinal abnormality all are hernia. Musculoskeletal:  Minor degenerative changes of the lumbar spine. IMPRESSION: Moderate to severe acute pancreatitis without fluid collection, abscess or pseudocyst. Mild wall thickening of the adjacent duodenum, suspect reactive duodenitis secondary to the adjacent inflammatory process. No associated bowel obstruction or free air Electronically Signed   By: Jerilynn Mages.  Shick M.D.   On: 12/14/2014 19:36   Ct Head Wo Contrast  12/14/2014  CLINICAL DATA:  Patient with altered mental status. Recent diagnosis urinary tract infection. EXAM: CT HEAD WITHOUT CONTRAST TECHNIQUE: Contiguous axial images were obtained from the base of the skull through the vertex without intravenous contrast. COMPARISON:  MRI brain 01/25/2009. FINDINGS: Ventricles and sulci are appropriate for patient's age. No evidence for acute cortically based infarct, intracranial hemorrhage, mass lesion mass-effect. Orbits are unremarkable. Paranasal sinuses are well aerated. Mastoid air cells are unremarkable. Calvarium is intact. IMPRESSION: No acute intracranial process. Electronically Signed   By: Lovey Newcomer M.D.   On: 12/14/2014 19:30   Dg Chest Port 1 View  12/14/2014  CLINICAL DATA:  Altered Mental status, Elevated Blood/sugar/ Fever. HX: diabetes, CAD, Asthma, Cardiac Catherization EXAM: PORTABLE CHEST - 1 VIEW COMPARISON:  01/08/2010 FINDINGS: Low lung volumes. No focal infiltrate or overt edema. Heart size normal. No pneumothorax. No effusion. Visualized skeletal structures are unremarkable. IMPRESSION: Low lung volumes.  No acute cardiopulmonary disease. Electronically Signed   By: Lucrezia Europe M.D.   On:  12/14/2014 19:31   ASSESSMENT / PLAN:  PULMONARY No acute  P:   O2 as needed.  CARDIOVASCULAR BP 98/62 P:  Monitor Keep MAP >65  RENAL AKI in the setting of DKA/ dehydration, Cr 2.69>4.85 Metabolic acidosis, anion gap (AG 29 on admission) + non anion gap met acidosis, improving but not resolved. PH this am 7.326, Bicarb 10.5 K stable at 3.9  P:   S/p additional 1L NS overnight, resumed MIVFs this am BMP q4 IVFs as below Monitor K and replete as needed  GASTROINTESTINAL Acute pancreatitis, Lipase 3000, TGs 3651>1070 P:   NPO, except water IVFs Fentanyl 75 mcg q1 as needed pain Reglan 5 mg as needed Follow abdominal exam  HEMATOLOGIC Mild leukocytosis 11.3 P:  Repeat CBC in am  INFECTIOUS Acute pancreatitis but no evidence of perforation, abscess or pseudocyst on CT so no antibiotics needed for now S/p 1 dose of Zosyn at [facility] Afebrile P:   BCx2 pending  Abx: 1 dose Zosyn, start date 11/22 PCT protocol No additional antibiotics at this time Will monitor  ENDOCRINE H/o diabetes type 2 DKA, with AG acidosis, AG 29> 17 P:   Glucose stabilizer protocol, currently on insulin gtt IFVs, switch to d5 1/2NS when glucose <250 Will transition to sub q insulin when gap closes and bg <250 x4 BMPs as above q1 BG checks  NEUROLOGIC Metabolic encephalopathy, improving with fluids CT  head negative for acute processes P:   Monitor  FAMILY  - Updates: Patient and wife updated at bedside  - Inter-disciplinary family meet or Palliative Care meeting due by:  12/22/14  Alera Quevedo M. Lajuana Ripple, DO PGY-2, [facility] 12/15/2014, 4:52 AM

## 2014-12-15 NOTE — Progress Notes (Signed)
Patient's wife Called and updated on patient's episode of Cardiac arrest and transfer to .

## 2014-12-15 NOTE — Progress Notes (Signed)
Utilization review completed. Dajiah Kooi, RN, BSN. 

## 2014-12-16 ENCOUNTER — Inpatient Hospital Stay (HOSPITAL_COMMUNITY): Payer: BLUE CROSS/BLUE SHIELD

## 2014-12-16 DIAGNOSIS — Z01818 Encounter for other preprocedural examination: Secondary | ICD-10-CM | POA: Diagnosis present

## 2014-12-16 DIAGNOSIS — K859 Acute pancreatitis without necrosis or infection, unspecified: Secondary | ICD-10-CM | POA: Diagnosis present

## 2014-12-16 DIAGNOSIS — Z4659 Encounter for fitting and adjustment of other gastrointestinal appliance and device: Secondary | ICD-10-CM | POA: Diagnosis present

## 2014-12-16 DIAGNOSIS — J9601 Acute respiratory failure with hypoxia: Secondary | ICD-10-CM | POA: Diagnosis present

## 2014-12-16 LAB — CBC WITH DIFFERENTIAL/PLATELET
Basophils Absolute: 0 10*3/uL (ref 0.0–0.1)
Basophils Relative: 0 %
EOS PCT: 0 %
Eosinophils Absolute: 0 10*3/uL (ref 0.0–0.7)
HCT: 38.8 % — ABNORMAL LOW (ref 39.0–52.0)
HEMOGLOBIN: 14 g/dL (ref 13.0–17.0)
LYMPHS ABS: 0.9 10*3/uL (ref 0.7–4.0)
LYMPHS PCT: 8 %
MCH: 29.5 pg (ref 26.0–34.0)
MCHC: 36.1 g/dL — AB (ref 30.0–36.0)
MCV: 81.9 fL (ref 78.0–100.0)
MONOS PCT: 4 %
Monocytes Absolute: 0.4 10*3/uL (ref 0.1–1.0)
NEUTROS PCT: 88 %
Neutro Abs: 9.3 10*3/uL — ABNORMAL HIGH (ref 1.7–7.7)
Platelets: 79 10*3/uL — ABNORMAL LOW (ref 150–400)
RBC: 4.74 MIL/uL (ref 4.22–5.81)
RDW: 14.7 % (ref 11.5–15.5)
WBC: 10.6 10*3/uL — AB (ref 4.0–10.5)

## 2014-12-16 LAB — COMPREHENSIVE METABOLIC PANEL
ALBUMIN: 2 g/dL — AB (ref 3.5–5.0)
ALK PHOS: 39 U/L (ref 38–126)
ALT: 115 U/L — ABNORMAL HIGH (ref 17–63)
ALT: 103 U/L — AB (ref 17–63)
AST: 286 U/L — AB (ref 15–41)
AST: 223 U/L — AB (ref 15–41)
Albumin: 1.9 g/dL — ABNORMAL LOW (ref 3.5–5.0)
Alkaline Phosphatase: 40 U/L (ref 38–126)
Anion gap: 11 (ref 5–15)
Anion gap: 12 (ref 5–15)
BILIRUBIN TOTAL: 0.6 mg/dL (ref 0.3–1.2)
BUN: 60 mg/dL — AB (ref 6–20)
BUN: 60 mg/dL — AB (ref 6–20)
CALCIUM: 5.8 mg/dL — AB (ref 8.9–10.3)
CHLORIDE: 105 mmol/L (ref 101–111)
CO2: 14 mmol/L — ABNORMAL LOW (ref 22–32)
CO2: 13 mmol/L — AB (ref 22–32)
CREATININE: 3.78 mg/dL — AB (ref 0.61–1.24)
Calcium: 5.8 mg/dL — CL (ref 8.9–10.3)
Chloride: 105 mmol/L (ref 101–111)
Creatinine, Ser: 4.05 mg/dL — ABNORMAL HIGH (ref 0.61–1.24)
GFR calc Af Amer: 19 mL/min — ABNORMAL LOW (ref >60)
GFR calc Af Amer: 18 mL/min — ABNORMAL LOW (ref >60)
GFR calc non Af Amer: 17 mL/min — ABNORMAL LOW (ref >60)
GFR, EST NON AFRICAN AMERICAN: 15 mL/min — AB (ref >60)
GLUCOSE: 190 mg/dL — AB (ref 65–99)
Glucose, Bld: 175 mg/dL — ABNORMAL HIGH (ref 65–99)
POTASSIUM: 3.4 mmol/L — AB (ref 3.5–5.1)
Potassium: 3.3 mmol/L — ABNORMAL LOW (ref 3.5–5.1)
SODIUM: 130 mmol/L — AB (ref 135–145)
Sodium: 130 mmol/L — ABNORMAL LOW (ref 135–145)
TOTAL PROTEIN: 4.8 g/dL — AB (ref 6.5–8.1)
Total Bilirubin: 0.6 mg/dL (ref 0.3–1.2)
Total Protein: 4.4 g/dL — ABNORMAL LOW (ref 6.5–8.1)

## 2014-12-16 LAB — GLUCOSE, CAPILLARY
GLUCOSE-CAPILLARY: 131 mg/dL — AB (ref 65–99)
GLUCOSE-CAPILLARY: 131 mg/dL — AB (ref 65–99)
GLUCOSE-CAPILLARY: 138 mg/dL — AB (ref 65–99)
GLUCOSE-CAPILLARY: 145 mg/dL — AB (ref 65–99)
GLUCOSE-CAPILLARY: 148 mg/dL — AB (ref 65–99)
GLUCOSE-CAPILLARY: 151 mg/dL — AB (ref 65–99)
GLUCOSE-CAPILLARY: 156 mg/dL — AB (ref 65–99)
GLUCOSE-CAPILLARY: 158 mg/dL — AB (ref 65–99)
GLUCOSE-CAPILLARY: 161 mg/dL — AB (ref 65–99)
GLUCOSE-CAPILLARY: 161 mg/dL — AB (ref 65–99)
GLUCOSE-CAPILLARY: 162 mg/dL — AB (ref 65–99)
GLUCOSE-CAPILLARY: 171 mg/dL — AB (ref 65–99)
GLUCOSE-CAPILLARY: 174 mg/dL — AB (ref 65–99)
GLUCOSE-CAPILLARY: 182 mg/dL — AB (ref 65–99)
GLUCOSE-CAPILLARY: 199 mg/dL — AB (ref 65–99)
Glucose-Capillary: 193 mg/dL — ABNORMAL HIGH (ref 65–99)
Glucose-Capillary: 181 mg/dL — ABNORMAL HIGH (ref 65–99)
Glucose-Capillary: 172 mg/dL — ABNORMAL HIGH (ref 65–99)
Glucose-Capillary: 172 mg/dL — ABNORMAL HIGH (ref 65–99)
Glucose-Capillary: 175 mg/dL — ABNORMAL HIGH (ref 65–99)
Glucose-Capillary: 152 mg/dL — ABNORMAL HIGH (ref 65–99)
Glucose-Capillary: 164 mg/dL — ABNORMAL HIGH (ref 65–99)
Glucose-Capillary: 192 mg/dL — ABNORMAL HIGH (ref 65–99)
Glucose-Capillary: 188 mg/dL — ABNORMAL HIGH (ref 65–99)
Glucose-Capillary: 155 mg/dL — ABNORMAL HIGH (ref 65–99)
Glucose-Capillary: 157 mg/dL — ABNORMAL HIGH (ref 65–99)
Glucose-Capillary: 157 mg/dL — ABNORMAL HIGH (ref 65–99)
Glucose-Capillary: 167 mg/dL — ABNORMAL HIGH (ref 65–99)
Glucose-Capillary: 154 mg/dL — ABNORMAL HIGH (ref 65–99)
Glucose-Capillary: 144 mg/dL — ABNORMAL HIGH (ref 65–99)
Glucose-Capillary: 140 mg/dL — ABNORMAL HIGH (ref 65–99)

## 2014-12-16 LAB — BASIC METABOLIC PANEL
Anion gap: 11 (ref 5–15)
Anion gap: 10 (ref 5–15)
Anion gap: 11 (ref 5–15)
BUN: 63 mg/dL — AB (ref 6–20)
BUN: 62 mg/dL — AB (ref 6–20)
BUN: 65 mg/dL — AB (ref 6–20)
CALCIUM: 5.4 mg/dL — AB (ref 8.9–10.3)
CALCIUM: 5.1 mg/dL — AB (ref 8.9–10.3)
CHLORIDE: 106 mmol/L (ref 101–111)
CO2: 15 mmol/L — ABNORMAL LOW (ref 22–32)
CO2: 12 mmol/L — ABNORMAL LOW (ref 22–32)
CO2: 15 mmol/L — ABNORMAL LOW (ref 22–32)
CREATININE: 4.57 mg/dL — AB (ref 0.61–1.24)
CREATININE: 4.54 mg/dL — AB (ref 0.61–1.24)
CREATININE: 4.76 mg/dL — AB (ref 0.61–1.24)
Calcium: 5.2 mg/dL — CL (ref 8.9–10.3)
Chloride: 104 mmol/L (ref 101–111)
Chloride: 104 mmol/L (ref 101–111)
GFR calc Af Amer: 15 mL/min — ABNORMAL LOW (ref >60)
GFR calc Af Amer: 16 mL/min — ABNORMAL LOW (ref >60)
GFR calc Af Amer: 15 mL/min — ABNORMAL LOW (ref >60)
GFR, EST NON AFRICAN AMERICAN: 13 mL/min — AB (ref >60)
GFR, EST NON AFRICAN AMERICAN: 13 mL/min — AB (ref >60)
GFR, EST NON AFRICAN AMERICAN: 13 mL/min — AB (ref >60)
GLUCOSE: 139 mg/dL — AB (ref 65–99)
Glucose, Bld: 174 mg/dL — ABNORMAL HIGH (ref 65–99)
Glucose, Bld: 166 mg/dL — ABNORMAL HIGH (ref 65–99)
POTASSIUM: 3 mmol/L — AB (ref 3.5–5.1)
Potassium: 3.1 mmol/L — ABNORMAL LOW (ref 3.5–5.1)
Potassium: 2.9 mmol/L — ABNORMAL LOW (ref 3.5–5.1)
SODIUM: 130 mmol/L — AB (ref 135–145)
SODIUM: 128 mmol/L — AB (ref 135–145)
SODIUM: 130 mmol/L — AB (ref 135–145)

## 2014-12-16 LAB — BLOOD GAS, ARTERIAL
ACID-BASE DEFICIT: 13.9 mmol/L — AB (ref 0.0–2.0)
BICARBONATE: 11.3 meq/L — AB (ref 20.0–24.0)
Drawn by: 441351
FIO2: 1
LHR: 20 {breaths}/min
O2 Saturation: 97.7 %
PEEP/CPAP: 5 cmH2O
PO2 ART: 366 mmHg — AB (ref 80.0–100.0)
Patient temperature: 98.6
TCO2: 12 mmol/L (ref 0–100)
VT: 580 mL
pCO2 arterial: 23.7 mmHg — ABNORMAL LOW (ref 35.0–45.0)
pH, Arterial: 7.3 — ABNORMAL LOW (ref 7.350–7.450)

## 2014-12-16 LAB — LIPASE, BLOOD: Lipase: 1290 U/L — ABNORMAL HIGH (ref 11–51)

## 2014-12-16 LAB — POCT I-STAT 3, ART BLOOD GAS (G3+)
Acid-base deficit: 14 mmol/L — ABNORMAL HIGH (ref 0.0–2.0)
Acid-base deficit: 12 mmol/L — ABNORMAL HIGH (ref 0.0–2.0)
Acid-base deficit: 9 mmol/L — ABNORMAL HIGH (ref 0.0–2.0)
Bicarbonate: 12 mEq/L — ABNORMAL LOW (ref 20.0–24.0)
Bicarbonate: 12.9 meq/L — ABNORMAL LOW (ref 20.0–24.0)
Bicarbonate: 13.3 meq/L — ABNORMAL LOW (ref 20.0–24.0)
O2 Saturation: 99 %
O2 Saturation: 97 %
O2 Saturation: 99 %
PH ART: 7.227 — AB (ref 7.350–7.450)
Patient temperature: 38.9
Patient temperature: 38.2
TCO2: 13 mmol/L (ref 0–100)
TCO2: 14 mmol/L (ref 0–100)
TCO2: 14 mmol/L (ref 0–100)
pCO2 arterial: 28.9 mmHg — ABNORMAL LOW (ref 35.0–45.0)
pCO2 arterial: 29.7 mmHg — ABNORMAL LOW (ref 35.0–45.0)
pCO2 arterial: 22.2 mmHg — ABNORMAL LOW (ref 35.0–45.0)
pH, Arterial: 7.255 — ABNORMAL LOW (ref 7.350–7.450)
pH, Arterial: 7.391 (ref 7.350–7.450)
pO2, Arterial: 139 mmHg — ABNORMAL HIGH (ref 80.0–100.0)
pO2, Arterial: 118 mmHg — ABNORMAL HIGH (ref 80.0–100.0)
pO2, Arterial: 162 mmHg — ABNORMAL HIGH (ref 80.0–100.0)

## 2014-12-16 LAB — PROCALCITONIN: PROCALCITONIN: 3.82 ng/mL

## 2014-12-16 LAB — TRIGLYCERIDES: Triglycerides: 641 mg/dL — ABNORMAL HIGH (ref <150)

## 2014-12-16 LAB — CBC
HCT: 40.7 % (ref 39.0–52.0)
Hemoglobin: 14.6 g/dL (ref 13.0–17.0)
MCH: 29.4 pg (ref 26.0–34.0)
MCHC: 35.9 g/dL (ref 30.0–36.0)
MCV: 82.1 fL (ref 78.0–100.0)
PLATELETS: 72 10*3/uL — AB (ref 150–400)
RBC: 4.96 MIL/uL (ref 4.22–5.81)
RDW: 15 % (ref 11.5–15.5)
WBC: 10.3 10*3/uL (ref 4.0–10.5)

## 2014-12-16 LAB — PHOSPHORUS: Phosphorus: 2.4 mg/dL — ABNORMAL LOW (ref 2.5–4.6)

## 2014-12-16 LAB — MAGNESIUM: MAGNESIUM: 1.9 mg/dL (ref 1.7–2.4)

## 2014-12-16 LAB — CORTISOL: CORTISOL PLASMA: 38.9 ug/dL

## 2014-12-16 LAB — LACTIC ACID, PLASMA: LACTIC ACID, VENOUS: 0.9 mmol/L (ref 0.5–2.0)

## 2014-12-16 MED ORDER — MIDAZOLAM HCL 2 MG/2ML IJ SOLN
INTRAMUSCULAR | Status: AC
  Filled 2014-12-16: qty 4

## 2014-12-16 MED ORDER — NOREPINEPHRINE BITARTRATE 1 MG/ML IV SOLN
0.0000 ug/min | INTRAVENOUS | Status: DC
  Administered 2014-12-16: 25 ug/min via INTRAVENOUS
  Filled 2014-12-16: qty 16

## 2014-12-16 MED ORDER — FENTANYL BOLUS VIA INFUSION
50.0000 ug | INTRAVENOUS | Status: DC | PRN
  Administered 2014-12-16 – 2014-12-17 (×2): 50 ug via INTRAVENOUS
  Filled 2014-12-16: qty 50

## 2014-12-16 MED ORDER — VANCOMYCIN HCL 10 G IV SOLR
1500.0000 mg | INTRAVENOUS | Status: DC
  Administered 2014-12-18: 1500 mg via INTRAVENOUS
  Filled 2014-12-16: qty 1500

## 2014-12-16 MED ORDER — MIDAZOLAM HCL 2 MG/2ML IJ SOLN: 2.0000 mg | INTRAMUSCULAR | Status: DC | PRN

## 2014-12-16 MED ORDER — ETOMIDATE 2 MG/ML IV SOLN
20.0000 mg | Freq: Once | INTRAVENOUS | Status: AC
  Administered 2014-12-16: 20 mg via INTRAVENOUS

## 2014-12-16 MED ORDER — SODIUM CHLORIDE 0.9 % IV SOLN
2.0000 g | Freq: Once | INTRAVENOUS | Status: AC
  Administered 2014-12-16: 2 g via INTRAVENOUS
  Filled 2014-12-16: qty 20

## 2014-12-16 MED ORDER — ANTISEPTIC ORAL RINSE SOLUTION (CORINZ)
7.0000 mL | Freq: Four times a day (QID) | OROMUCOSAL | Status: DC
  Administered 2014-12-16 – 2015-01-02 (×57): 7 mL via OROMUCOSAL

## 2014-12-16 MED ORDER — SODIUM CHLORIDE 0.9 % IV BOLUS (SEPSIS)
1000.0000 mL | Freq: Once | INTRAVENOUS | Status: AC
  Administered 2014-12-16: 1000 mL via INTRAVENOUS

## 2014-12-16 MED ORDER — STERILE WATER FOR INJECTION IV SOLN
INTRAVENOUS | Status: DC
  Administered 2014-12-16 – 2014-12-17 (×5): via INTRAVENOUS
  Filled 2014-12-16 (×7): qty 850

## 2014-12-16 MED ORDER — VASOPRESSIN 20 UNIT/ML IV SOLN
0.0300 [IU]/min | INTRAVENOUS | Status: DC
  Administered 2014-12-16: 0.03 [IU]/min via INTRAVENOUS
  Filled 2014-12-16: qty 2

## 2014-12-16 MED ORDER — VITAL HIGH PROTEIN PO LIQD
1000.0000 mL | ORAL | Status: DC
Start: 2014-12-16 — End: 2014-12-17
  Filled 2014-12-16 (×3): qty 1000

## 2014-12-16 MED ORDER — FENTANYL CITRATE (PF) 100 MCG/2ML IJ SOLN
INTRAMUSCULAR | Status: AC
  Filled 2014-12-16: qty 4

## 2014-12-16 MED ORDER — ROCURONIUM BROMIDE 50 MG/5ML IV SOLN
50.0000 mg | Freq: Once | INTRAVENOUS | Status: AC
  Administered 2014-12-16: 50 mg via INTRAVENOUS
  Filled 2014-12-16: qty 5

## 2014-12-16 MED ORDER — SODIUM CHLORIDE 0.9 % IV SOLN
2.0000 g | Freq: Once | INTRAVENOUS | Status: AC
  Administered 2014-12-17: 2 g via INTRAVENOUS
  Filled 2014-12-16: qty 20

## 2014-12-16 MED ORDER — SODIUM CHLORIDE 0.9 % IV BOLUS (SEPSIS)
500.0000 mL | Freq: Once | INTRAVENOUS | Status: AC
  Administered 2014-12-16: 500 mL via INTRAVENOUS

## 2014-12-16 MED ORDER — SODIUM CHLORIDE 0.9 % IV SOLN
1.0000 g | Freq: Once | INTRAVENOUS | Status: AC
  Administered 2014-12-16: 1 g via INTRAVENOUS
  Filled 2014-12-16: qty 10

## 2014-12-16 MED ORDER — IMIPENEM-CILASTATIN 250 MG IV SOLR
250.0000 mg | Freq: Four times a day (QID) | INTRAVENOUS | Status: AC
  Administered 2014-12-16 – 2014-12-23 (×28): 250 mg via INTRAVENOUS
  Filled 2014-12-16 (×29): qty 250

## 2014-12-16 MED ORDER — CHLORHEXIDINE GLUCONATE 0.12% ORAL RINSE (MEDLINE KIT)
15.0000 mL | Freq: Two times a day (BID) | OROMUCOSAL | Status: DC
  Administered 2014-12-16 – 2015-01-02 (×33): 15 mL via OROMUCOSAL

## 2014-12-16 MED ORDER — PHENYLEPHRINE HCL 10 MG/ML IJ SOLN
30.0000 ug/min | INTRAMUSCULAR | Status: DC
  Administered 2014-12-16: 10 ug/min via INTRAVENOUS
  Filled 2014-12-16: qty 4

## 2014-12-16 MED ORDER — NOREPINEPHRINE BITARTRATE 1 MG/ML IV SOLN
0.0000 ug/min | INTRAVENOUS | Status: DC
  Administered 2014-12-16: 30 ug/min via INTRAVENOUS
  Administered 2014-12-16: 5 ug/min via INTRAVENOUS
  Filled 2014-12-16 (×2): qty 4

## 2014-12-16 MED ORDER — FENTANYL CITRATE (PF) 100 MCG/2ML IJ SOLN
100.0000 ug | Freq: Once | INTRAMUSCULAR | Status: AC
  Administered 2014-12-16: 100 ug via INTRAVENOUS

## 2014-12-16 MED ORDER — FENTANYL CITRATE (PF) 100 MCG/2ML IJ SOLN
50.0000 ug | Freq: Once | INTRAMUSCULAR | Status: AC
  Administered 2014-12-16: 50 ug via INTRAVENOUS

## 2014-12-16 MED ORDER — MIDAZOLAM HCL 2 MG/2ML IJ SOLN
2.0000 mg | Freq: Once | INTRAMUSCULAR | Status: AC
  Administered 2014-12-16: 2 mg via INTRAVENOUS

## 2014-12-16 MED ORDER — POTASSIUM CHLORIDE 10 MEQ/50ML IV SOLN
10.0000 meq | INTRAVENOUS | Status: AC
  Administered 2014-12-16 (×2): 10 meq via INTRAVENOUS
  Filled 2014-12-16 (×2): qty 50

## 2014-12-16 MED ORDER — SODIUM CHLORIDE 0.9 % IV SOLN
25.0000 ug/h | INTRAVENOUS | Status: DC
  Administered 2014-12-16: 50 ug/h via INTRAVENOUS
  Administered 2014-12-16 (×2): 250 ug/h via INTRAVENOUS
  Filled 2014-12-16 (×4): qty 50

## 2014-12-16 MED ORDER — POTASSIUM CHLORIDE 10 MEQ/50ML IV SOLN
10.0000 meq | INTRAVENOUS | Status: AC
  Administered 2014-12-17 (×4): 10 meq via INTRAVENOUS
  Filled 2014-12-16 (×4): qty 50

## 2014-12-16 MED ORDER — VANCOMYCIN HCL 10 G IV SOLR
2000.0000 mg | Freq: Once | INTRAVENOUS | Status: AC
  Administered 2014-12-16: 2000 mg via INTRAVENOUS
  Filled 2014-12-16: qty 2000

## 2014-12-16 MED ORDER — MIDAZOLAM HCL 2 MG/2ML IJ SOLN
2.0000 mg | INTRAMUSCULAR | Status: DC | PRN
  Administered 2014-12-16: 1 mg via INTRAVENOUS
  Administered 2014-12-18 – 2014-12-21 (×3): 2 mg via INTRAVENOUS
  Filled 2014-12-16 (×3): qty 2

## 2014-12-16 MED ORDER — HYDROCORTISONE NA SUCCINATE PF 100 MG IJ SOLR
50.0000 mg | Freq: Four times a day (QID) | INTRAMUSCULAR | Status: DC
  Administered 2014-12-16 – 2014-12-19 (×13): 50 mg via INTRAVENOUS
  Filled 2014-12-16 (×3): qty 2
  Filled 2014-12-16: qty 1
  Filled 2014-12-16: qty 2
  Filled 2014-12-16 (×11): qty 1
  Filled 2014-12-16 (×2): qty 2

## 2014-12-16 NOTE — Progress Notes (Signed)
NP Brandi paged to make aware of pt's dropping BP (systolic in 60's, map <40) despite increasing doses of levophed. Also made aware of BMET results (k=3.1, critical calcium of 5.4, cr 4.57)  Order received for vasopressin and 1L bolus  Felipa EmoryJarrell, Jerman Tinnon Denise

## 2014-12-16 NOTE — Progress Notes (Signed)
eLink Physician-Brief Progress Note Patient Name: Brent ShamesDaniel J Rasmussen DOB: Feb 26, 1960 MRN: [id number]   Date of Service  12/16/2014  HPI/Events of Note  Ca 5.8 (7.4 after correction for albumin), K 3.3  eICU Interventions  Ca and K repleted. Check ionized Ca.     Intervention Category Major Interventions: Electrolyte abnormality - evaluation and management  Kainalu Heggs 12/16/2014, 2:24 AM

## 2014-12-16 NOTE — Progress Notes (Addendum)
ANTIBIOTIC CONSULT NOTE - INITIAL  Pharmacy Consult for imipenem/imipenem Indication: sepsis  Allergies  Allergen Reactions  . Sulfa Antibiotics Other (See Comments)    Asthma attack    Patient Measurements: Height: 5\' 10"  (177.8 cm) Weight: 236 lb 15.9 oz (107.5 kg) IBW/kg (Calculated) : 73  Vital Signs: Temp: 102.2 F (39 C) (11/24 1015) Temp Source: Core (Comment) (11/24 0130) BP: 78/47 mmHg (11/24 1015) Pulse Rate: 128 (11/24 1015) Intake/Output from previous day: 11/23 0701 - 11/24 0700 In: 4870.9 [I.V.:3090.9; NG/GT:60; IV Piggyback:1720] Out: 226 [Urine:226] Intake/Output from this shift: Total I/O In: 804.1 [I.V.:774.1; NG/GT:30] Out: 100 [Urine:100]  Labs:  Recent Labs  12/15/14 0345  12/15/14 1942 12/16/14 0140 12/16/14 0609  WBC 11.3*  --   --  10.3 10.6*  HGB 16.3  --   --  14.6 14.0  PLT 133*  --   --  72* 79*  CREATININE 2.29*  < > 3.41* 3.78* 4.05*  < > = values in this interval not displayed. Estimated Creatinine Clearance: 25.6 mL/min (by C-G formula based on Cr of 4.05). No results for input(s): VANCOTROUGH, VANCOPEAK, VANCORANDOM, GENTTROUGH, GENTPEAK, GENTRANDOM, TOBRATROUGH, TOBRAPEAK, TOBRARND, AMIKACINPEAK, AMIKACINTROU, AMIKACIN in the last 72 hours.   Microbiology: Recent Results (from the past 720 hour(s))  Urine culture     Status: None   Collection Time: 12/14/14  6:20 PM  Result Value Ref Range Status   Specimen Description URINE, CATHETERIZED  Final   Special Requests NONE  Final   Culture   Final    NO GROWTH 1 DAY Performed at [facility]    Report Status 12/15/2014 FINAL  Final  Blood Culture (routine x 2)     Status: None (Preliminary result)   Collection Time: 12/14/14  6:34 PM  Result Value Ref Range Status   Specimen Description BLOOD LEFT WRIST  Final   Special Requests BOTTLES DRAWN AEROBIC AND ANAEROBIC 5 CC EACH  Final   Culture   Final    NO GROWTH 2 DAYS Performed at [facility]    Report  Status PENDING  Incomplete  Blood Culture (routine x 2)     Status: None (Preliminary result)   Collection Time: 12/14/14  6:40 PM  Result Value Ref Range Status   Specimen Description BLOOD RIGHT ANTECUBITAL  Final   Special Requests BOTTLES DRAWN AEROBIC AND ANAEROBIC 5CC  Final   Culture   Final    NO GROWTH 2 DAYS Performed at [facility]     Report Status PENDING  Incomplete  MRSA PCR Screening     Status: None   Collection Time: 12/15/14  9:07 PM  Result Value Ref Range Status   MRSA by PCR NEGATIVE NEGATIVE Final    Comment:        The GeneXpert MRSA Assay (FDA approved for NASAL specimens only), is one component of a comprehensive MRSA colonization surveillance program. It is not intended to diagnose MRSA infection nor to guide or monitor treatment for MRSA infections.     Medical History: Past Medical History  Diagnosis Date  . Diabetes mellitus without complication (HCC)   . Coronary artery disease   . Asthma   . Bell's palsy     Assessment: 54 YO male [facility] w/ N/V, lack of appetite, and fatigue x 3 days found to have slurred speech and abdominal pain. Found to have DKA and pancreatitis, [facility]. Pt was seen at urgent care the day prior, diagnosed with kidney  infection, given cipro. Pharmacy consulted to start imipenem/vanc for acute pancreatitis, concern for perforation, abscess, or pseudocyst. No history of seizures noted. Normalized CrCl~21, wt 107kg. WBC down to 10.6, febrile Tmax/24h 102.2, LA 6.59 > 2.36.   11/24 vanc>> 11/24 imipenem>> 11/23 zosyn x 1 dose  11/22 Blood cx: ngtd 11/22 Urine cx: NGF  Goal of Therapy:  Eradication of infection  Plan:  Imipenem  IV q6h Start vanc 2g x 1 dose; then vanc  IV q48h per obesity nomogram Monitor clinical progress, c/s, renal function, abx plan/LOT  Babs Bertin, PharmD Clinical Pharmacist Pager [phone] 12/16/2014 10:43 AM

## 2014-12-16 NOTE — Progress Notes (Signed)
MD Tyson AliasFeinstein updated at bedside regarding pt's low UOP ( <30/hr) , temperature (>102), and persistent hypotension despite increased dosages of neo. New orders received will monitor  Brent Rasmussen, Brent Rasmussen

## 2014-12-16 NOTE — Progress Notes (Signed)
eLink Physician-Brief Progress Note Patient Name: Leotis ShamesDaniel J Petz DOB: 03/30/1960 MRN: [id number]   Date of Service  12/16/2014  HPI/Events of Note  BP trending down (SBP 90s), CVP 6, sinus tachycardia (HR in 120s)  eICU Interventions  Bolus 1 lt NS.     Intervention Category Intermediate Interventions: Hypotension - evaluation and management  Faelynn Wynder 12/16/2014, 3:31 AM

## 2014-12-16 NOTE — Procedures (Signed)
NGT Placement Under Direct Laryngoscopic Visualization  After intubation while laryngoscope is there OGT was passed and visualized directly going into the esophagus. Confirmed by auscultation and xray after.  Chealsea Paske G. Rylynne Schicker, M.D. [place] [facility]. Pager: [phone]. After hours pager: [phone]. 

## 2014-12-16 NOTE — Progress Notes (Signed)
Called back by bedside RN, patient is breathing 35-40 BPM.  He is stating that he can hold on some more but is getting tired.  He is paradoxical and is in early stages of respiratory failure.  SBP is also starting to drop and will need central line for pressors.  Was not able to lay flat.  Therefore, I discussed this with him and his wife.  Early intubation in this case is safest.  Therefore, after discussion, decision was made to proceed with intubation and placement of central line.  Will start neo and check CVP and hydrate accordingly.  ABG at 2 AM.  Sedation and vent orders in and will adjust vent accordingly post ABG.  Patient is not febrile and WBC is marginal so will not start abx.  Will hydrate more aggressively however given worsening renal failure and hypotension.  The patient is critically ill with multiple organ systems failure and requires high complexity decision making for assessment and support, frequent evaluation and titration of therapies, application of advanced monitoring technologies and extensive interpretation of multiple databases.   Critical Care Time devoted to patient care services described in this note is  45  Minutes. This time reflects time of care of this signee Dr Koren BoundWesam Virtie Bungert. This critical care time does not reflect procedure time, or teaching time or supervisory time of PA/NP/Med student/Med Resident etc but could involve care discussion time.  Alyson ReedyWesam G. Nijah Orlich, M.D. [facility]. Pager: [phone]. After hours pager: [phone].

## 2014-12-16 NOTE — Progress Notes (Signed)
CRITICAL VALUE ALERT  Critical value received:  Calcium 5.1  Date of notification:  12/16/14  Time of notification:  1844  Critical value read back:Yes.    Nurse who received alert:  Edison PaceAshley Jany Buckwalter  MD notified:  Nicholos Johnsamachandran with [facility] Corn[facility]

## 2014-12-16 NOTE — Progress Notes (Signed)
eLink Physician-Brief Progress Note Patient Name: Brent ShamesDaniel J Kinser DOB: Dec 07, 1960 MRN: [id number]   Date of Service  12/16/2014  HPI/Events of Note  Hypokalemia in the setting of renal insufficiency  eICU Interventions  Potassium replaced     Intervention Category Intermediate Interventions: Electrolyte abnormality - evaluation and management  DETERDING,ELIZABETH 12/16/2014, 11:21 PM

## 2014-12-16 NOTE — Progress Notes (Signed)
PULMONARY / CRITICAL CARE MEDICINE   Name:Brent Rasmussen FAO:[id number] DOB:16-Jan-1961   ADMISSION DATE: 12/14/2014 CONSULTATION DATE: 12/14/14  REFERRING MD : [facility]  CHIEF COMPLAINT: DKA, pancreatitis  INITIAL PRESENTATION: Brent Rasmussen is a 54 y.o. male that presented to [facility] for abdominal pain. He was found to have pancreatitis and be in DKA. There he was provided 3.5 L bolus. He was started on an insulin drip and was transferred to [facility] for care.   STUDIES:  11/22 MRI brain: No acute processes 11/22 CT abdomen: Moderate to severe acute pancreatitis without fluid collection, abscess or pseudocyst.  SIGNIFICANT EVENTS: 11/22 -Transferred to [facility] from [facility] 11/23-BM vasal vagals, cpr 2 min , ETT placed, on presors  SUBJECTIVE:  Neo almost maxed    VITAL SIGNS: Temp: [94 F (34.4 C)-99.8 F (37.7 C)] 97.1 F (36.2 C) (11/23 0416) Pulse Rate: [114-135] 126 (11/23 0430) Resp: [20-33] 30 (11/23 0430) BP: (62-145)/(49-129) 95/74 mmHg (11/23 0430) SpO2: [96 %-100 %] 98 % (11/23 0430) Weight: [230 lb (104.327 kg)-231 lb 4.2 oz (104.9 kg)] 231 lb 4.2 oz (104.9 kg) (11/23 0415) HEMODYNAMICS:   VENTILATOR SETTINGS:   INTAKE / OUTPUT:  Intake/Output Summary (Last 24 hours) at 12/15/14 0452 Last data filed at 12/15/14 0400  Gross per 24 hour  Intake 3711.57 ml  Output  50 ml  Net 3661.57 ml    PHYSICAL EXAMINATION: General: ett, rass 0 Neuro: Follows commands, no focal deficits HEENT:line clean Cardiovascular: RRR, no murmurs Lungs: CTA Abdomen: distended +BS, +epigastric tenderness to palpation, no peritoneal signs. Musculoskeletal: Moves all extremities, mild edema Skin: Several healing excoriations on anterior shins bilaterally.  LABS:  CBC  Last Labs      Recent Labs Lab 12/14/14 1810 12/14/14 2351 12/15/14 0345  WBC 15.4* 14.6* 11.3*  HGB  17.7* 17.8* 16.3  HCT 50.5 48.9 45.8  PLT 262 198 133*     Coag's  Last Labs      Recent Labs Lab 12/14/14 1810  INR 1.02     BMET  Last Labs      Recent Labs Lab 12/14/14 1810 12/14/14 2357  NA 128* 133*  K 4.2 4.0  CL 93* 104  CO2 6* 6*  BUN 37* 38*  CREATININE 1.87* 2.39*  GLUCOSE 485* 426*     Electrolytes  Last Labs      Recent Labs Lab 12/14/14 1810 12/14/14 2357  CALCIUM 9.8 8.4*     Sepsis Markers  Last Labs      Recent Labs Lab 12/14/14 1826 12/14/14 2023  LATICACIDVEN 6.59* 2.36*     ABG  Last Labs      Recent Labs Lab 12/15/14 0354  PHART 7.326*  PCO2ART 20.8*  PO2ART 101*     Liver Enzymes  Last Labs      Recent Labs Lab 12/14/14 1810  AST 32  ALT 22  ALKPHOS 73  BILITOT 1.9*  ALBUMIN 4.4     Cardiac Enzymes  Last Labs      Recent Labs Lab 12/14/14 1810  TROPONINI <0.03     Glucose  Last Labs      Recent Labs Lab 12/14/14 1817 12/14/14 2009 12/14/14 2133 12/15/14 0415  GLUCAP 490* 380* 395* 262*      Imaging Ct Abdomen Pelvis Wo Contrast  12/14/2014 CLINICAL DATA: Altered mental status, reason UTI, hypothermia, diabetes, elevated lactate EXAM: CT ABDOMEN AND PELVIS WITHOUT CONTRAST TECHNIQUE: Multidetector CT imaging of the  abdomen and pelvis was performed following the standard protocol without IV contrast. COMPARISON: None. FINDINGS: Lower chest: No acute findings. Hepatobiliary: No mass visualized on this un-enhanced exam. Pancreas: Diffuse peripancreatic stranding edema evident which also extends along the retroperitoneum, duodenum, and transverse mesocolon. Findings are compatible with moderate to severe acute pancreatitis. No associated fluid collection, pseudocyst or abscess. No ductal dilatation. Spleen: Within normal limits in size. Adrenals/Urinary Tract: Normal adrenal glands. Minor nonspecific perinephric  strandy edema without obstructing urinary tract or ureteral calculus. Stomach/Bowel: Slight thickening of the duodenum adjacent to the pancreas, suspect mild secondary duodenitis. No associated obstruction, ileus, or free air. Vascular/Lymphatic: No adenopathy. Negative for aneurysm. Minor atherosclerosis of the bifurcation and iliac vessels. Reproductive: No mass or other significant abnormality. Other: No inguinal abnormality all are hernia. Musculoskeletal: Minor degenerative changes of the lumbar spine. IMPRESSION: Moderate to severe acute pancreatitis without fluid collection, abscess or pseudocyst. Mild wall thickening of the adjacent duodenum, suspect reactive duodenitis secondary to the adjacent inflammatory process. No associated bowel obstruction or free air Electronically Signed By: Judie PetitM. Shick M.D. On: 12/14/2014 19:36   Ct Head Wo Contrast  12/14/2014 CLINICAL DATA: Patient with altered mental status. Recent diagnosis urinary tract infection. EXAM: CT HEAD WITHOUT CONTRAST TECHNIQUE: Contiguous axial images were obtained from the base of the skull through the vertex without intravenous contrast. COMPARISON: MRI brain 01/25/2009. FINDINGS: Ventricles and sulci are appropriate for patient's age. No evidence for acute cortically based infarct, intracranial hemorrhage, mass lesion mass-effect. Orbits are unremarkable. Paranasal sinuses are well aerated. Mastoid air cells are unremarkable. Calvarium is intact. IMPRESSION: No acute intracranial process. Electronically Signed By: Annia Beltrew Davis M.D. On: 12/14/2014 19:30   Dg Chest Port 1 View  12/14/2014 CLINICAL DATA: Altered Mental status, Elevated Blood/sugar/ Fever. HX: diabetes, CAD, Asthma, Cardiac Catherization EXAM: PORTABLE CHEST - 1 VIEW COMPARISON: 01/08/2010 FINDINGS: Low lung volumes. No focal infiltrate or overt edema. Heart size normal. No pneumothorax. No effusion. Visualized skeletal structures are unremarkable. IMPRESSION:  Low lung volumes. No acute cardiopulmonary disease. Electronically Signed By: Corlis Leak Hassell M.D. On: 12/14/2014 19:31   ASSESSMENT / PLAN:  PULMONARY At risk ARDS Acute resp failure ATX P:   pcxr in am  ABG reviewed, increase MV, rate to 26, repeat abg May need a line No SBt witrh acidosis and elevated MV  CARDIOVASCULAR Shock, SIRS, Hypovolemic, r/o septic P:  cvp 5 , bolus Neo dc, add levophed Get cortisol then empiric stress roids \\may  need albumin  RENAL AKI in the setting of DKA/ dehydration, Cr 1.87>2.29resolved AG ALL NONAG  hypoKalemia R/o RTA 4 P:  supp k  Bicarb maintain Chem q8h  GASTROINTESTINAL Mild- moderate Acute pancreatitis, Lipase resolving, TGsresolving P:  Restart attempt TF trickle, lipase less 2500, no abscess, pseudocysts Vital, small peptid, trickle at 10, place post pylloric ppi lft in am   HEMATOLOGIC dvt prevention, thrombocytopenia ( sirs) P:  Repeat CBC in am Sub q heparin , follow trend plat  INFECTIOUS Acute pancreatitis but no evidence of perforation, abscess or pseudocyst on CT so no antibiotics needed for now Clinical declines P:  BCx2 pending  ABX Imipenem  Add wiyh clinical declines Get pct  ENDOCRINE H/o diabetes type 2 DKA resolved, now hyperglycemia P:  Insulin drip  NEUROLOGIC Metabolic encephalopathy, resolved P:  Monitor pain on fent rass goal 0  FAMILY  - Updates: Patient and wife updated at bedside  - Inter-disciplinary family meet or Palliative Care meeting due by: 12/22/14  Ccm time 30 min  Mcarthur Rossetti. Tyson Alias, MD, FACP Pgr: [phone] [place] [facility] 12/16/2014 10:27 AM

## 2014-12-16 NOTE — Progress Notes (Signed)
At approximately 2250 pt got out of bed to have a bowel movement. Pt expressed having little engery, was diaphoretic and pale. RN pressed the call light to get additional help in the room. Fatima SangerFred Smith, RN, came into the room to assist. The pt had a syncopal episode and became pulseless. The code button was pulled. Compressions were completed for appromiately 2 mins. Pt was transferred to 2M07.

## 2014-12-16 NOTE — Progress Notes (Signed)
CRITICAL VALUE ALERT  Critical value received:  Ca-5.8  Date of notification:  12/16/2014  Time of notification:  6:48 AM  Critical value read back:Yes.    Nurse who received alert:  Terrilyn SaverHopper, Zakariya Knickerbocker Anderson  MD notified (1st page):  Dr. Isaiah SergeMannam  Time of first page:  6:48 AM  MD notified (2nd page):  Time of second page:  Responding MD:  Dr. Isaiah SergeMannam  Time MD responded:  6:48 AM

## 2014-12-16 NOTE — Procedures (Signed)
Arterial Catheter Insertion Procedure Note Leotis ShamesDaniel J Hausler [id number] 1960/12/29  Procedure: Insertion of Arterial Catheter  Indications: Blood pressure monitoring and Frequent blood sampling  Procedure Details Consent: Risks of procedure as well as the alternatives and risks of each were explained to the (patient/caregiver).  Consent for procedure obtained. Time Out: Verified patient identification, verified procedure, site/side was marked, verified correct patient position, special equipment/implants available, medications/allergies/relevent history reviewed, required imaging and test results available.  Performed  Maximum sterile technique was used including antiseptics, cap, gloves, gown, hand hygiene, mask and sheet. Skin prep: Chlorhexidine; local anesthetic administered 20 gauge catheter was inserted into left radial artery using the Seldinger technique.  Evaluation Blood flow good; BP tracing good. Complications: No apparent complications.   Nelda BucksFEINSTEIN,Ike J. 12/16/2014   I DID THIS PROCEDURE!!  US  Mcarthur Rossettianiel J. Tyson AliasFeinstein, MD, FACP Pgr: [phone] [place] [facility]

## 2014-12-16 NOTE — Procedures (Signed)
Central Venous Catheter Insertion Procedure Note Leotis ShamesDaniel J Curiale [id number] [birth year]  Procedure: Insertion of Central Venous Catheter Indications: Assessment of intravascular volume, Drug and/or fluid administration and Frequent blood sampling  Procedure Details Consent: Risks of procedure as well as the alternatives and risks of each were explained to the (patient/caregiver).  Consent for procedure obtained. Time Out: Verified patient identification, verified procedure, site/side was marked, verified correct patient position, special equipment/implants available, medications/allergies/relevent history reviewed, required imaging and test results available.  Performed  Maximum sterile technique was used including antiseptics, cap, gloves, gown, hand hygiene, mask and sheet. Skin prep: Chlorhexidine; local anesthetic administered A antimicrobial bonded/coated triple lumen catheter was placed in the left subclavian vein using the Seldinger technique.  Evaluation Blood flow good Complications: No apparent complications Patient did tolerate procedure well. Chest X-ray ordered to verify placement.  CXR: normal.  YACOUB,WESAM 12/16/2014, 1:07 AM

## 2014-12-16 NOTE — Progress Notes (Signed)
eLink Physician-Brief Progress Note Patient Name: Brent ShamesDaniel J Firebaugh DOB: 07-13-1960 MRN: [id number]   Date of Service  12/16/2014  HPI/Events of Note  Ongoing hypocalcemia in the setting of renal insufficiency.  eICU Interventions  Calcium replaced Intact PTH ordered     Intervention Category Intermediate Interventions: Electrolyte abnormality - evaluation and management  DETERDING,ELIZABETH 12/16/2014, 11:54 PM

## 2014-12-16 NOTE — Procedures (Signed)
Intubation Procedure Note Leotis ShamesDaniel J Persichetti [id number] 1960-04-20  Procedure: Intubation Indications: Respiratory insufficiency  Procedure Details Consent: Risks of procedure as well as the alternatives and risks of each were explained to the (patient/caregiver).  Consent for procedure obtained. Time Out: Verified patient identification, verified procedure, site/side was marked, verified correct patient position, special equipment/implants available, medications/allergies/relevent history reviewed, required imaging and test results available.  Performed  Maximum sterile technique was used including gloves, hand hygiene and mask.  MAC    Evaluation Hemodynamic Status: BP stable throughout; O2 sats: stable throughout Patient's Current Condition: stable Complications: No apparent complications Patient did tolerate procedure well. Chest X-ray ordered to verify placement.  CXR: tube position acceptable.   Koren BoundYACOUB,Kolin Erdahl 12/16/2014

## 2014-12-16 NOTE — Progress Notes (Signed)
Cortrak team no here in observance of holiday- unable to place tube. Will pass along to oncoming shift

## 2014-12-16 NOTE — Progress Notes (Signed)
CRITICAL VALUE ALERT  Critical value received:  Ca-5.8  Date of notification:  12/16/2014  Time of notification:  2:23 AM  Critical value read back:Yes.    Nurse who received alert:  Terrilyn SaverHopper, Aubery Douthat Anderson  MD notified (1st page):  Dr. Isaiah SergeMannam  Time of first page:  2:23 AM  MD notified (2nd page):  Time of second page:  Responding MD:  Dr. Isaiah SergeMannam  Time MD responded:  2:23 AM

## 2014-12-16 NOTE — Progress Notes (Signed)
CRITICAL VALUE ALERT  Critical value received:  Calcium 5.2  Date of notification:  12-16-14  Time of notification:  2318  Critical value read back:Yes.    Nurse who received alert:  Stefani DamaKristina Saidah Kempton  MD notified (1st page):  Holland CommonsE Deterding, MD Pola Corn(Elink)  Time of first page:  2345  MD notified (2nd page):  Time of second page:  Responding MD:  Holland CommonsE Deterding, MD   Time MD responded:  [phone]

## 2014-12-17 ENCOUNTER — Inpatient Hospital Stay (HOSPITAL_COMMUNITY): Payer: BLUE CROSS/BLUE SHIELD

## 2014-12-17 DIAGNOSIS — Z01818 Encounter for other preprocedural examination: Secondary | ICD-10-CM

## 2014-12-17 DIAGNOSIS — E0810 Diabetes mellitus due to underlying condition with ketoacidosis without coma: Secondary | ICD-10-CM

## 2014-12-17 LAB — BASIC METABOLIC PANEL
ANION GAP: 12 (ref 5–15)
Anion gap: 13 (ref 5–15)
Anion gap: 12 (ref 5–15)
BUN: 68 mg/dL — ABNORMAL HIGH (ref 6–20)
BUN: 69 mg/dL — AB (ref 6–20)
BUN: 70 mg/dL — AB (ref 6–20)
CALCIUM: 5 mg/dL — AB (ref 8.9–10.3)
CALCIUM: 4.9 mg/dL — AB (ref 8.9–10.3)
CHLORIDE: 102 mmol/L (ref 101–111)
CHLORIDE: 101 mmol/L (ref 101–111)
CO2: 14 mmol/L — AB (ref 22–32)
CO2: 17 mmol/L — ABNORMAL LOW (ref 22–32)
CO2: 17 mmol/L — AB (ref 22–32)
CREATININE: 4.72 mg/dL — AB (ref 0.61–1.24)
Calcium: 5 mg/dL — CL (ref 8.9–10.3)
Chloride: 102 mmol/L (ref 101–111)
Creatinine, Ser: 4.88 mg/dL — ABNORMAL HIGH (ref 0.61–1.24)
Creatinine, Ser: 4.89 mg/dL — ABNORMAL HIGH (ref 0.61–1.24)
GFR calc Af Amer: 14 mL/min — ABNORMAL LOW (ref >60)
GFR calc non Af Amer: 13 mL/min — ABNORMAL LOW (ref >60)
GFR, EST AFRICAN AMERICAN: 15 mL/min — AB (ref >60)
GFR, EST AFRICAN AMERICAN: 14 mL/min — AB (ref >60)
GFR, EST NON AFRICAN AMERICAN: 12 mL/min — AB (ref >60)
GFR, EST NON AFRICAN AMERICAN: 12 mL/min — AB (ref >60)
GLUCOSE: 178 mg/dL — AB (ref 65–99)
GLUCOSE: 211 mg/dL — AB (ref 65–99)
Glucose, Bld: 161 mg/dL — ABNORMAL HIGH (ref 65–99)
POTASSIUM: 3.3 mmol/L — AB (ref 3.5–5.1)
Potassium: 2.9 mmol/L — ABNORMAL LOW (ref 3.5–5.1)
Potassium: 3.2 mmol/L — ABNORMAL LOW (ref 3.5–5.1)
SODIUM: 130 mmol/L — AB (ref 135–145)
Sodium: 129 mmol/L — ABNORMAL LOW (ref 135–145)
Sodium: 131 mmol/L — ABNORMAL LOW (ref 135–145)

## 2014-12-17 LAB — POCT I-STAT 3, ART BLOOD GAS (G3+)
ACID-BASE DEFICIT: 7 mmol/L — AB (ref 0.0–2.0)
Acid-base deficit: 9 mmol/L — ABNORMAL HIGH (ref 0.0–2.0)
BICARBONATE: 15.4 meq/L — AB (ref 20.0–24.0)
Bicarbonate: 15.7 mEq/L — ABNORMAL LOW (ref 20.0–24.0)
O2 SAT: 100 %
O2 Saturation: 98 %
PH ART: 7.446 (ref 7.350–7.450)
PO2 ART: 160 mmHg — AB (ref 80.0–100.0)
PO2 ART: 117 mmHg — AB (ref 80.0–100.0)
Patient temperature: 98.6
TCO2: 16 mmol/L (ref 0–100)
TCO2: 16 mmol/L (ref 0–100)
pCO2 arterial: 22.7 mmHg — ABNORMAL LOW (ref 35.0–45.0)
pCO2 arterial: 28.8 mmHg — ABNORMAL LOW (ref 35.0–45.0)
pH, Arterial: 7.338 — ABNORMAL LOW (ref 7.350–7.450)

## 2014-12-17 LAB — CBC WITH DIFFERENTIAL/PLATELET
Basophils Absolute: 0 10*3/uL (ref 0.0–0.1)
Basophils Relative: 0 %
EOS ABS: 0 10*3/uL (ref 0.0–0.7)
EOS PCT: 0 %
HCT: 31.5 % — ABNORMAL LOW (ref 39.0–52.0)
Hemoglobin: 11.4 g/dL — ABNORMAL LOW (ref 13.0–17.0)
LYMPHS PCT: 6 %
Lymphs Abs: 0.5 10*3/uL — ABNORMAL LOW (ref 0.7–4.0)
MCH: 29.5 pg (ref 26.0–34.0)
MCHC: 36.2 g/dL — AB (ref 30.0–36.0)
MCV: 81.4 fL (ref 78.0–100.0)
Monocytes Absolute: 0.5 10*3/uL (ref 0.1–1.0)
Monocytes Relative: 6 %
NEUTROS PCT: 88 %
Neutro Abs: 7.5 10*3/uL (ref 1.7–7.7)
PLATELETS: 72 10*3/uL — AB (ref 150–400)
RBC: 3.87 MIL/uL — ABNORMAL LOW (ref 4.22–5.81)
RDW: 15.2 % (ref 11.5–15.5)
WBC: 8.6 10*3/uL (ref 4.0–10.5)

## 2014-12-17 LAB — GLUCOSE, CAPILLARY
GLUCOSE-CAPILLARY: 141 mg/dL — AB (ref 65–99)
GLUCOSE-CAPILLARY: 148 mg/dL — AB (ref 65–99)
GLUCOSE-CAPILLARY: 159 mg/dL — AB (ref 65–99)
GLUCOSE-CAPILLARY: 166 mg/dL — AB (ref 65–99)
GLUCOSE-CAPILLARY: 136 mg/dL — AB (ref 65–99)
GLUCOSE-CAPILLARY: 190 mg/dL — AB (ref 65–99)
Glucose-Capillary: 137 mg/dL — ABNORMAL HIGH (ref 65–99)
Glucose-Capillary: 139 mg/dL — ABNORMAL HIGH (ref 65–99)
Glucose-Capillary: 140 mg/dL — ABNORMAL HIGH (ref 65–99)
Glucose-Capillary: 140 mg/dL — ABNORMAL HIGH (ref 65–99)
Glucose-Capillary: 161 mg/dL — ABNORMAL HIGH (ref 65–99)
Glucose-Capillary: 167 mg/dL — ABNORMAL HIGH (ref 65–99)
Glucose-Capillary: 176 mg/dL — ABNORMAL HIGH (ref 65–99)
Glucose-Capillary: 216 mg/dL — ABNORMAL HIGH (ref 65–99)
Glucose-Capillary: 237 mg/dL — ABNORMAL HIGH (ref 65–99)

## 2014-12-17 LAB — COMPREHENSIVE METABOLIC PANEL
ALT: 98 U/L — AB (ref 17–63)
AST: 133 U/L — AB (ref 15–41)
Albumin: 1.5 g/dL — ABNORMAL LOW (ref 3.5–5.0)
Alkaline Phosphatase: 41 U/L (ref 38–126)
Anion gap: 12 (ref 5–15)
BUN: 67 mg/dL — AB (ref 6–20)
CHLORIDE: 103 mmol/L (ref 101–111)
CO2: 15 mmol/L — AB (ref 22–32)
CREATININE: 4.87 mg/dL — AB (ref 0.61–1.24)
Calcium: 5.6 mg/dL — CL (ref 8.9–10.3)
GFR calc non Af Amer: 12 mL/min — ABNORMAL LOW (ref >60)
GFR, EST AFRICAN AMERICAN: 14 mL/min — AB (ref >60)
Glucose, Bld: 148 mg/dL — ABNORMAL HIGH (ref 65–99)
Potassium: 3 mmol/L — ABNORMAL LOW (ref 3.5–5.1)
SODIUM: 130 mmol/L — AB (ref 135–145)
Total Bilirubin: 0.5 mg/dL (ref 0.3–1.2)
Total Protein: 4.2 g/dL — ABNORMAL LOW (ref 6.5–8.1)

## 2014-12-17 LAB — LACTIC ACID, PLASMA: LACTIC ACID, VENOUS: 0.8 mmol/L (ref 0.5–2.0)

## 2014-12-17 LAB — PROCALCITONIN: PROCALCITONIN: 2.09 ng/mL

## 2014-12-17 LAB — CALCIUM, IONIZED: Calcium, Ionized, Serum: 3.2 mg/dL — ABNORMAL LOW (ref 4.5–5.6)

## 2014-12-17 MED ORDER — SODIUM CHLORIDE 0.9 % IV SOLN
2.0000 g | Freq: Once | INTRAVENOUS | Status: AC
  Administered 2014-12-17: 2 g via INTRAVENOUS
  Filled 2014-12-17: qty 20

## 2014-12-17 MED ORDER — SODIUM CHLORIDE 0.9 % IV SOLN
2.0000 g | Freq: Once | INTRAVENOUS | Status: DC
  Filled 2014-12-17: qty 20

## 2014-12-17 MED ORDER — SODIUM CHLORIDE 0.9 % IV SOLN
1.5000 mg/h | INTRAVENOUS | Status: DC
  Administered 2014-12-17: 1.5 mg/h via INTRAVENOUS
  Filled 2014-12-17: qty 5

## 2014-12-17 MED ORDER — SODIUM CHLORIDE 0.9 % IJ SOLN: 9.0000 mL | INTRAMUSCULAR | Status: DC | PRN

## 2014-12-17 MED ORDER — NALOXONE HCL 0.4 MG/ML IJ SOLN: 0.4000 mg | INTRAMUSCULAR | Status: DC | PRN

## 2014-12-17 MED ORDER — DIPHENHYDRAMINE HCL 12.5 MG/5ML PO ELIX
12.5000 mg | ORAL_SOLUTION | Freq: Four times a day (QID) | ORAL | Status: DC | PRN
  Filled 2014-12-17: qty 5

## 2014-12-17 MED ORDER — HYDROMORPHONE 1 MG/ML IV SOLN
INTRAVENOUS | Status: DC
  Administered 2014-12-17: 21:00:00 via INTRAVENOUS
  Administered 2014-12-17: 2.97 mg via INTRAVENOUS

## 2014-12-17 MED ORDER — POTASSIUM CHLORIDE 10 MEQ/50ML IV SOLN
10.0000 meq | INTRAVENOUS | Status: AC
  Administered 2014-12-17 (×3): 10 meq via INTRAVENOUS
  Filled 2014-12-17 (×3): qty 50

## 2014-12-17 MED ORDER — ONDANSETRON HCL 4 MG/2ML IJ SOLN: 4.0000 mg | Freq: Four times a day (QID) | INTRAMUSCULAR | Status: DC | PRN

## 2014-12-17 MED ORDER — HYDROMORPHONE 1 MG/ML IV SOLN
INTRAVENOUS | Status: DC
  Administered 2014-12-17: 18:00:00 via INTRAVENOUS
  Administered 2014-12-17: 2 mg via INTRAVENOUS
  Filled 2014-12-17: qty 25

## 2014-12-17 MED ORDER — INSULIN ASPART 100 UNIT/ML ~~LOC~~ SOLN
0.0000 [IU] | SUBCUTANEOUS | Status: DC
  Administered 2014-12-17: 3 [IU] via SUBCUTANEOUS
  Administered 2014-12-17 (×2): 4 [IU] via SUBCUTANEOUS
  Administered 2014-12-17 – 2014-12-18 (×3): 7 [IU] via SUBCUTANEOUS
  Administered 2014-12-18 (×2): 4 [IU] via SUBCUTANEOUS
  Administered 2014-12-18 – 2014-12-19 (×6): 7 [IU] via SUBCUTANEOUS
  Administered 2014-12-19: 4 [IU] via SUBCUTANEOUS
  Administered 2014-12-19 – 2014-12-20 (×3): 11 [IU] via SUBCUTANEOUS
  Administered 2014-12-20: 7 [IU] via SUBCUTANEOUS
  Administered 2014-12-20 – 2014-12-21 (×4): 11 [IU] via SUBCUTANEOUS
  Administered 2014-12-21: 4 [IU] via SUBCUTANEOUS
  Administered 2014-12-21: 7 [IU] via SUBCUTANEOUS
  Administered 2014-12-21 (×3): 11 [IU] via SUBCUTANEOUS
  Administered 2014-12-22 (×2): 7 [IU] via SUBCUTANEOUS
  Administered 2014-12-22: 4 [IU] via SUBCUTANEOUS
  Administered 2014-12-22 (×2): 7 [IU] via SUBCUTANEOUS
  Administered 2014-12-22 – 2014-12-23 (×5): 3 [IU] via SUBCUTANEOUS
  Administered 2014-12-23 (×3): 4 [IU] via SUBCUTANEOUS
  Administered 2014-12-24 – 2014-12-25 (×4): 3 [IU] via SUBCUTANEOUS
  Administered 2014-12-25: 4 [IU] via SUBCUTANEOUS
  Administered 2014-12-25 – 2014-12-26 (×5): 3 [IU] via SUBCUTANEOUS
  Administered 2014-12-26: 4 [IU] via SUBCUTANEOUS
  Administered 2014-12-26 (×2): 3 [IU] via SUBCUTANEOUS
  Administered 2014-12-26: 0 [IU] via SUBCUTANEOUS
  Administered 2014-12-26: 3 [IU] via SUBCUTANEOUS
  Administered 2014-12-27: 4 [IU] via SUBCUTANEOUS
  Administered 2014-12-27: 3 [IU] via SUBCUTANEOUS
  Administered 2014-12-27: 0 [IU] via SUBCUTANEOUS
  Administered 2014-12-27 (×3): 4 [IU] via SUBCUTANEOUS
  Administered 2014-12-28 (×3): 11 [IU] via SUBCUTANEOUS
  Administered 2014-12-28: 4 [IU] via SUBCUTANEOUS
  Administered 2014-12-28: 7 [IU] via SUBCUTANEOUS
  Administered 2014-12-28: 4 [IU] via SUBCUTANEOUS
  Administered 2014-12-29: 15 [IU] via SUBCUTANEOUS
  Administered 2014-12-29: 7 [IU] via SUBCUTANEOUS
  Administered 2014-12-29: 4 [IU] via SUBCUTANEOUS
  Administered 2014-12-29: 15 [IU] via SUBCUTANEOUS
  Administered 2014-12-29: 11 [IU] via SUBCUTANEOUS
  Administered 2014-12-29: 15 [IU] via SUBCUTANEOUS
  Administered 2014-12-30: 7 [IU] via SUBCUTANEOUS
  Administered 2014-12-30: 3 [IU] via SUBCUTANEOUS
  Administered 2014-12-30: 7 [IU] via SUBCUTANEOUS
  Administered 2014-12-30: 4 [IU] via SUBCUTANEOUS

## 2014-12-17 MED ORDER — HYDROMORPHONE 1 MG/ML IV SOLN: INTRAVENOUS | Status: DC

## 2014-12-17 MED ORDER — DIPHENHYDRAMINE HCL 50 MG/ML IJ SOLN: 12.5000 mg | Freq: Four times a day (QID) | INTRAMUSCULAR | Status: DC | PRN

## 2014-12-17 MED ORDER — HYDROMORPHONE HCL 1 MG/ML IJ SOLN: 0.5000 mg | INTRAMUSCULAR | Status: DC | PRN

## 2014-12-17 MED ORDER — INSULIN GLARGINE 100 UNIT/ML ~~LOC~~ SOLN
15.0000 [IU] | Freq: Every day | SUBCUTANEOUS | Status: DC
  Administered 2014-12-17: 15 [IU] via SUBCUTANEOUS
  Filled 2014-12-17 (×3): qty 0.15

## 2014-12-17 MED ORDER — CALCIUM GLUCONATE 10 % IV SOLN
1.0000 g | Freq: Once | INTRAVENOUS | Status: AC
  Administered 2014-12-17: 1 g via INTRAVENOUS
  Filled 2014-12-17: qty 10

## 2014-12-17 MED ORDER — SODIUM CHLORIDE 0.9 % IV SOLN: 2.0000 g | Freq: Once | INTRAVENOUS | Status: DC

## 2014-12-17 MED FILL — Medication: Qty: 1 | Status: AC

## 2014-12-17 NOTE — Progress Notes (Signed)
CRITICAL VALUE ALERT  Critical value received:  Calcium 5.0  Date of notification:  12-17-14  Time of notification:  0640  Critical value read back:Yes.    Nurse who received alert:  Stefani DamaKristina Ashunti Schofield  MD notified (1st page):  Holland CommonsE Deterding, MD (elink)  Time of first page:  0645  MD notified (2nd page):  Time of second page:  Responding MD:  Holland CommonsE Deterding, MD   Time MD responded:  [phone]

## 2014-12-17 NOTE — Progress Notes (Signed)
19 mL Dilaudid PCA wasted in sink with Mardella LaymanLindsey, RCharity fundraiser

## 2014-12-17 NOTE — Procedures (Signed)
Extubation Procedure Note  Patient Details:   Name: Leotis ShamesDaniel J Manfredonia DOB: 16-Mar-1960 MRN: [id number]   Airway Documentation:     Evaluation  O2 sats: stable throughout Complications: No apparent complications Patient did tolerate procedure well. Bilateral Breath Sounds: Clear Suctioning: Airway Yes  IS instructed 750ml 4l/min [place]  Newt LukesGroendal, Neyla Gauntt Ann 12/17/2014, 9:58 AM

## 2014-12-17 NOTE — Progress Notes (Signed)
eLink Physician-Brief Progress Note Patient Name: Brent ShamesDaniel J Rasmussen DOB: [birth year] MRN: [id number]   Date of Service  12/17/2014  HPI/Events of Note  RN calling abd pain/distention -> resp distress  Looks a bit still on camera exam with tachypnea, showllow breathing  eICU Interventions  Insert NG to LIS  resassess     Intervention Category Intermediate Interventions: Abdominal pain - evaluation and management  Jermall Isaacson 12/17/2014, 4:04 PM

## 2014-12-17 NOTE — Progress Notes (Signed)
Nutrition Consult  Received MD Consult for TF initiation and management. Patient was extubated this AM; feeding tube out; no plans to continue TF. Per discussion with RN and in rounds, plans to advance to clear liquids this AM. No nutrition intervention indicated at this time. Please re-consult if nutrition concerns arise.  Joaquin CourtsKimberly Harris, RD, LDN, CNSC Pager [phone] After Hours Pager [phone]

## 2014-12-17 NOTE — Progress Notes (Signed)
CRITICAL VALUE ALERT  Critical value received:  Calcium 4.9  Date of notification:  12/17/14  Time of notification:  1515  Critical value read back:Yes.    Nurse who received alert:  Layne BentonJulian Vinicio Lynk, RN    MD notified (1st page):  Dr. Marchelle Gearingamaswamy  Time of first page:  1600  MD notified (2nd page):   Time of second page:  Responding MD:  Dr. Marchelle Gearingamaswamy  Time MD responded:  1600

## 2014-12-17 NOTE — Progress Notes (Signed)
200ml of Fentanyl wasted down the sink. Witnessed by Louie BunSummer Deshun Sedivy, RN and Tory EmeraldMichelle Toler, Charity fundraiserN.

## 2014-12-17 NOTE — Progress Notes (Addendum)
eLink Physician-Brief Progress Note Patient Name: Brent ShamesDaniel J Colver DOB: 02-24-1960 MRN: [id number]   Date of Service  12/17/2014  HPI/Events of Note  Still with abd pain despite NG. KUB unrevealing  eICU Interventions  Change fent prn to dilaudid pca CT abd wo contrast     Intervention Category Intermediate Interventions: Abdominal pain - evaluation and management  Jerl Munyan 12/17/2014, 5:07 PM

## 2014-12-17 NOTE — Progress Notes (Signed)
PULMONARY / CRITICAL CARE MEDICINE   Name:Brent Rasmussen NWG:[id number] DOB:02/15/1960   ADMISSION DATE: 12/14/2014 CONSULTATION DATE: 12/14/14  REFERRING MD : [facility]  CHIEF COMPLAINT: DKA, pancreatitis  INITIAL PRESENTATION: Brent Rasmussen is a 54 y.o. male that presented to [facility] for abdominal pain. He was found to have pancreatitis and be in DKA. There he was provided 3.5 L bolus. He was started on an insulin drip and was transferred to [facility] for care.   STUDIES:  11/22 MRI brain: No acute processes 11/22 CT abdomen: Moderate to severe acute pancreatitis without fluid collection, abscess or pseudocyst.  SIGNIFICANT EVENTS: 11/22 -Transferred to [facility] from [facility] 11/23-BM vasal vagals, cpr 2 min , ETT placed, on presors 11/24- aline placed, major improved pressors, volume given  SUBJECTIVE:  Levo at 2, alert   VITAL SIGNS: Temp: [94 F (34.4 C)-99.8 F (37.7 C)] 97.1 F (36.2 C) (11/23 0416) Pulse Rate: [114-135] 126 (11/23 0430) Resp: [20-33] 30 (11/23 0430) BP: (62-145)/(49-129) 95/74 mmHg (11/23 0430) SpO2: [96 %-100 %] 98 % (11/23 0430) Weight: [230 lb (104.327 kg)-231 lb 4.2 oz (104.9 kg)] 231 lb 4.2 oz (104.9 kg) (11/23 0415) HEMODYNAMICS:   VENTILATOR SETTINGS:   INTAKE / OUTPUT:  Intake/Output Summary (Last 24 hours) at 12/15/14 0452 Last data filed at 12/15/14 0400  Gross per 24 hour  Intake 3711.57 ml  Output  50 ml  Net 3661.57 ml    PHYSICAL EXAMINATION: General: ett, rass 0 Neuro: Follows commands, no focal deficits HEENT:line clean Cardiovascular: RRR, no murmurs Lungs: CTA bilateral Abdomen: distended +BS, no r/g, soft Musculoskeletal: Moves all extremities, nonpitting edema Skin: Several healing excoriations on anterior shins bilaterally unchanged  LABS:  CBC  Last Labs      Recent Labs Lab 12/14/14 1810 12/14/14 2351 12/15/14 0345   WBC 15.4* 14.6* 11.3*  HGB 17.7* 17.8* 16.3  HCT 50.5 48.9 45.8  PLT 262 198 133*     Coag's  Last Labs      Recent Labs Lab 12/14/14 1810  INR 1.02     BMET  Last Labs      Recent Labs Lab 12/14/14 1810 12/14/14 2357  NA 128* 133*  K 4.2 4.0  CL 93* 104  CO2 6* 6*  BUN 37* 38*  CREATININE 1.87* 2.39*  GLUCOSE 485* 426*     Electrolytes  Last Labs      Recent Labs Lab 12/14/14 1810 12/14/14 2357  CALCIUM 9.8 8.4*     Sepsis Markers  Last Labs      Recent Labs Lab 12/14/14 1826 12/14/14 2023  LATICACIDVEN 6.59* 2.36*     ABG  Last Labs      Recent Labs Lab 12/15/14 0354  PHART 7.326*  PCO2ART 20.8*  PO2ART 101*     Liver Enzymes  Last Labs      Recent Labs Lab 12/14/14 1810  AST 32  ALT 22  ALKPHOS 73  BILITOT 1.9*  ALBUMIN 4.4     Cardiac Enzymes  Last Labs      Recent Labs Lab 12/14/14 1810  TROPONINI <0.03     Glucose  Last Labs      Recent Labs Lab 12/14/14 1817 12/14/14 2009 12/14/14 2133 12/15/14 0415  GLUCAP 490* 380* 395* 262*      Imaging Ct Abdomen Pelvis Wo Contrast  12/14/2014 CLINICAL DATA: Altered mental status, reason UTI, hypothermia, diabetes, elevated lactate EXAM: CT ABDOMEN AND PELVIS WITHOUT CONTRAST  TECHNIQUE: Multidetector CT imaging of the abdomen and pelvis was performed following the standard protocol without IV contrast. COMPARISON: None. FINDINGS: Lower chest: No acute findings. Hepatobiliary: No mass visualized on this un-enhanced exam. Pancreas: Diffuse peripancreatic stranding edema evident which also extends along the retroperitoneum, duodenum, and transverse mesocolon. Findings are compatible with moderate to severe acute pancreatitis. No associated fluid collection, pseudocyst or abscess. No ductal dilatation. Spleen: Within normal limits in size. Adrenals/Urinary Tract: Normal adrenal  glands. Minor nonspecific perinephric strandy edema without obstructing urinary tract or ureteral calculus. Stomach/Bowel: Slight thickening of the duodenum adjacent to the pancreas, suspect mild secondary duodenitis. No associated obstruction, ileus, or free air. Vascular/Lymphatic: No adenopathy. Negative for aneurysm. Minor atherosclerosis of the bifurcation and iliac vessels. Reproductive: No mass or other significant abnormality. Other: No inguinal abnormality all are hernia. Musculoskeletal: Minor degenerative changes of the lumbar spine. IMPRESSION: Moderate to severe acute pancreatitis without fluid collection, abscess or pseudocyst. Mild wall thickening of the adjacent duodenum, suspect reactive duodenitis secondary to the adjacent inflammatory process. No associated bowel obstruction or free air Electronically Signed By: Judie PetitM. Shick M.D. On: 12/14/2014 19:36   Ct Head Wo Contrast  12/14/2014 CLINICAL DATA: Patient with altered mental status. Recent diagnosis urinary tract infection. EXAM: CT HEAD WITHOUT CONTRAST TECHNIQUE: Contiguous axial images were obtained from the base of the skull through the vertex without intravenous contrast. COMPARISON: MRI brain 01/25/2009. FINDINGS: Ventricles and sulci are appropriate for patient's age. No evidence for acute cortically based infarct, intracranial hemorrhage, mass lesion mass-effect. Orbits are unremarkable. Paranasal sinuses are well aerated. Mastoid air cells are unremarkable. Calvarium is intact. IMPRESSION: No acute intracranial process. Electronically Signed By: Annia Beltrew Davis M.D. On: 12/14/2014 19:30   Dg Chest Port 1 View  12/14/2014 CLINICAL DATA: Altered Mental status, Elevated Blood/sugar/ Fever. HX: diabetes, CAD, Asthma, Cardiac Catherization EXAM: PORTABLE CHEST - 1 VIEW COMPARISON: 01/08/2010 FINDINGS: Low lung volumes. No focal infiltrate or overt edema. Heart size normal. No pneumothorax. No effusion. Visualized skeletal  structures are unremarkable. IMPRESSION: Low lung volumes. No acute cardiopulmonary disease. Electronically Signed By: Corlis Leak Hassell M.D. On: 12/14/2014 19:31   ASSESSMENT / PLAN:  PULMONARY Acute resp failure ATX P:  pcxr with edema abg now likley can reduce rate then wean below Major improved status, wean sbt, cpap 5 ps 5 goal 1 hr  CARDIOVASCULAR Shock, SIRS, Hypovolemic, r/o septic P:  cvp 14, kvo A line helped significantly, cuff not accurate Levo to map goal, dc vaso now  RENAL AKI in the setting of DKA/ dehydration ALL NONAG remains hypoKalemia R/o RTA 4 P:  supp k additional Bicarb maintain Chem q8h  GASTROINTESTINAL Mild- moderate Acute pancreatitis, Lipase resolving, TGsresolving P:  Vital, small peptid, trickle at 10, place post pylloric awaited ppi lft in am   HEMATOLOGIC dvt prevention, thrombocytopenia ( sirs) P:  Repeat CBC in am Sub q heparin , follow trend plat steady   INFECTIOUS Acute pancreatitis but no evidence of perforation, abscess or pseudocyst on CT  Shock 11/24 P:  BCx2 pending  ABX 11/24 Imipenem>>> Get pct trends, some may be from pancreatitis, trend most important  ENDOCRINE H/o diabetes type 2 DKA resolved, NO AG present - resolved P:  Insulin drip to off Has d5 in bicarb, dc d51./2 Transition off drip, add lantus, ssi res  NEUROLOGIC Metabolic encephalopathy, resolved P:  Monitor pain on fent, to wua rass goal 0  FAMILY  - Updates: no fam  - Inter-disciplinary family meet or Palliative Care meeting due  by: 12/22/14  Ccm time 30 min   Mcarthur Rossetti. Tyson Alias, MD, FACP Pgr: [phone] [facility] 12/17/2014 6:55 AM

## 2014-12-17 NOTE — Progress Notes (Signed)
eLink Physician-Brief Progress Note Patient Name: Brent ShamesDaniel J Rasmussen DOB: 02/25/1960 MRN: [id number]   Date of Service  12/17/2014  HPI/Events of Note  Ongoing severe pain despite dilaudid PCA.  Apparently cannot increase PCA continuous dosing due to lock-out and ET-CO2 monitoring.  Discussed with pharmacy.  eICU Interventions  Plan: Continuous dilaudid infusion starting at 1.5 mg/h PRN dilaudid 0.5 mg q2 hours prn Continue to monitor response to dilaudid      Intervention Category Intermediate Interventions: Pain - evaluation and management  Prentiss Hammett 12/17/2014, 10:22 PM

## 2014-12-17 NOTE — Progress Notes (Signed)
CRITICAL VALUE ALERT  Critical value received:  Calcium 5.6  Date of notification:  12-17-14  Time of notification:  0300  Critical value read back:Yes.    Nurse who received alert:  Stefani DamaKristina Hawkins Seaman  MD notified (1st page):  Holland CommonsE Deterding, MD Pola Corn(Elink)  Time of first page:  0300  MD notified (2nd page):  Time of second page:  Responding MD:  Holland CommonsE Deterding, MD  Time MD responded:  0300

## 2014-12-18 ENCOUNTER — Inpatient Hospital Stay (HOSPITAL_COMMUNITY): Payer: BLUE CROSS/BLUE SHIELD

## 2014-12-18 ENCOUNTER — Other Ambulatory Visit: Payer: Self-pay

## 2014-12-18 DIAGNOSIS — I214 Non-ST elevation (NSTEMI) myocardial infarction: Secondary | ICD-10-CM

## 2014-12-18 DIAGNOSIS — K859 Acute pancreatitis without necrosis or infection, unspecified: Principal | ICD-10-CM

## 2014-12-18 LAB — POCT I-STAT 3, ART BLOOD GAS (G3+)
ACID-BASE DEFICIT: 6 mmol/L — AB (ref 0.0–2.0)
Acid-base deficit: 3 mmol/L — ABNORMAL HIGH (ref 0.0–2.0)
BICARBONATE: 19.9 meq/L — AB (ref 20.0–24.0)
Bicarbonate: 19.9 mEq/L — ABNORMAL LOW (ref 20.0–24.0)
O2 SAT: 100 %
O2 Saturation: 92 %
PCO2 ART: 40.9 mmHg (ref 35.0–45.0)
PH ART: 7.295 — AB (ref 7.350–7.450)
PH ART: 7.436 (ref 7.350–7.450)
PO2 ART: 71 mmHg — AB (ref 80.0–100.0)
Patient temperature: 37
TCO2: 21 mmol/L (ref 0–100)
TCO2: 21 mmol/L (ref 0–100)
pCO2 arterial: 29.5 mmHg — ABNORMAL LOW (ref 35.0–45.0)
pO2, Arterial: 269 mmHg — ABNORMAL HIGH (ref 80.0–100.0)

## 2014-12-18 LAB — BASIC METABOLIC PANEL
ANION GAP: 14 (ref 5–15)
ANION GAP: 16 — AB (ref 5–15)
ANION GAP: 16 — AB (ref 5–15)
BUN: 80 mg/dL — ABNORMAL HIGH (ref 6–20)
BUN: 81 mg/dL — ABNORMAL HIGH (ref 6–20)
BUN: 78 mg/dL — AB (ref 6–20)
CALCIUM: 4.7 mg/dL — AB (ref 8.9–10.3)
CALCIUM: 5 mg/dL — AB (ref 8.9–10.3)
CALCIUM: 5.1 mg/dL — AB (ref 8.9–10.3)
CHLORIDE: 102 mmol/L (ref 101–111)
CO2: 18 mmol/L — ABNORMAL LOW (ref 22–32)
CO2: 18 mmol/L — AB (ref 22–32)
CO2: 19 mmol/L — ABNORMAL LOW (ref 22–32)
Chloride: 101 mmol/L (ref 101–111)
Chloride: 103 mmol/L (ref 101–111)
Creatinine, Ser: 5.11 mg/dL — ABNORMAL HIGH (ref 0.61–1.24)
Creatinine, Ser: 4.97 mg/dL — ABNORMAL HIGH (ref 0.61–1.24)
Creatinine, Ser: 4.91 mg/dL — ABNORMAL HIGH (ref 0.61–1.24)
GFR calc Af Amer: 14 mL/min — ABNORMAL LOW (ref >60)
GFR calc non Af Amer: 12 mL/min — ABNORMAL LOW (ref >60)
GFR, EST AFRICAN AMERICAN: 13 mL/min — AB (ref >60)
GFR, EST AFRICAN AMERICAN: 14 mL/min — AB (ref >60)
GFR, EST NON AFRICAN AMERICAN: 12 mL/min — AB (ref >60)
GFR, EST NON AFRICAN AMERICAN: 12 mL/min — AB (ref >60)
GLUCOSE: 218 mg/dL — AB (ref 65–99)
GLUCOSE: 201 mg/dL — AB (ref 65–99)
Glucose, Bld: 202 mg/dL — ABNORMAL HIGH (ref 65–99)
POTASSIUM: 3 mmol/L — AB (ref 3.5–5.1)
Potassium: 3.6 mmol/L (ref 3.5–5.1)
Potassium: 3 mmol/L — ABNORMAL LOW (ref 3.5–5.1)
SODIUM: 133 mmol/L — AB (ref 135–145)
Sodium: 136 mmol/L (ref 135–145)
Sodium: 138 mmol/L (ref 135–145)

## 2014-12-18 LAB — COMPREHENSIVE METABOLIC PANEL
ALK PHOS: 50 U/L (ref 38–126)
ALT: 58 U/L (ref 17–63)
AST: 49 U/L — AB (ref 15–41)
Albumin: 1.6 g/dL — ABNORMAL LOW (ref 3.5–5.0)
Anion gap: 15 (ref 5–15)
BUN: 78 mg/dL — AB (ref 6–20)
CALCIUM: 4.9 mg/dL — AB (ref 8.9–10.3)
CHLORIDE: 100 mmol/L — AB (ref 101–111)
CO2: 18 mmol/L — AB (ref 22–32)
CREATININE: 5.3 mg/dL — AB (ref 0.61–1.24)
GFR calc non Af Amer: 11 mL/min — ABNORMAL LOW (ref >60)
GFR, EST AFRICAN AMERICAN: 13 mL/min — AB (ref >60)
GLUCOSE: 237 mg/dL — AB (ref 65–99)
Potassium: 3.7 mmol/L (ref 3.5–5.1)
SODIUM: 133 mmol/L — AB (ref 135–145)
Total Bilirubin: 1.1 mg/dL (ref 0.3–1.2)
Total Protein: 4.6 g/dL — ABNORMAL LOW (ref 6.5–8.1)

## 2014-12-18 LAB — GLUCOSE, CAPILLARY
GLUCOSE-CAPILLARY: 211 mg/dL — AB (ref 65–99)
Glucose-Capillary: 214 mg/dL — ABNORMAL HIGH (ref 65–99)
Glucose-Capillary: 195 mg/dL — ABNORMAL HIGH (ref 65–99)
Glucose-Capillary: 209 mg/dL — ABNORMAL HIGH (ref 65–99)
Glucose-Capillary: 162 mg/dL — ABNORMAL HIGH (ref 65–99)

## 2014-12-18 LAB — PROCALCITONIN: PROCALCITONIN: 0.86 ng/mL

## 2014-12-18 LAB — LACTIC ACID, PLASMA: Lactic Acid, Venous: 0.7 mmol/L (ref 0.5–2.0)

## 2014-12-18 LAB — TROPONIN I
Troponin I: 4.35 ng/mL (ref <0.031)
Troponin I: 4.41 ng/mL (ref <0.031)

## 2014-12-18 LAB — AMYLASE: Amylase: 144 U/L — ABNORMAL HIGH (ref 28–100)

## 2014-12-18 LAB — LIPASE, BLOOD: LIPASE: 110 U/L — AB (ref 11–51)

## 2014-12-18 LAB — MAGNESIUM: Magnesium: 1.7 mg/dL (ref 1.7–2.4)

## 2014-12-18 MED ORDER — SODIUM CHLORIDE 0.9 % IV SOLN
25.0000 ug/h | INTRAVENOUS | Status: DC
  Administered 2014-12-18: 100 ug/h via INTRAVENOUS
  Administered 2014-12-18 – 2014-12-19 (×3): 50 ug/h via INTRAVENOUS
  Administered 2014-12-19: 100 ug/h via INTRAVENOUS
  Administered 2014-12-19: 300 ug/h via INTRAVENOUS
  Administered 2014-12-19 – 2014-12-20 (×3): 50 ug/h via INTRAVENOUS
  Administered 2014-12-20: 300 ug/h via INTRAVENOUS
  Administered 2014-12-20: 50 ug/h via INTRAVENOUS
  Filled 2014-12-18 (×4): qty 50

## 2014-12-18 MED ORDER — POTASSIUM CHLORIDE 10 MEQ/50ML IV SOLN
10.0000 meq | INTRAVENOUS | Status: AC
  Administered 2014-12-18 (×2): 10 meq via INTRAVENOUS
  Filled 2014-12-18 (×2): qty 50

## 2014-12-18 MED ORDER — FENTANYL CITRATE (PF) 100 MCG/2ML IJ SOLN
INTRAMUSCULAR | Status: AC
  Administered 2014-12-18: 100 ug
  Filled 2014-12-18: qty 2

## 2014-12-18 MED ORDER — ASPIRIN 325 MG PO TABS
325.0000 mg | ORAL_TABLET | Freq: Every day | ORAL | Status: DC
  Administered 2014-12-18 – 2014-12-30 (×12): 325 mg via ORAL
  Filled 2014-12-18 (×12): qty 1

## 2014-12-18 MED ORDER — FENTANYL CITRATE (PF) 100 MCG/2ML IJ SOLN: 50.0000 ug | Freq: Once | INTRAMUSCULAR | Status: AC

## 2014-12-18 MED ORDER — FUROSEMIDE 10 MG/ML IJ SOLN
80.0000 mg | Freq: Two times a day (BID) | INTRAMUSCULAR | Status: DC
  Administered 2014-12-18 – 2014-12-19 (×3): 80 mg via INTRAVENOUS
  Filled 2014-12-18 (×4): qty 8

## 2014-12-18 MED ORDER — METOPROLOL TARTRATE 12.5 MG HALF TABLET
12.5000 mg | ORAL_TABLET | Freq: Two times a day (BID) | ORAL | Status: DC
  Administered 2014-12-18 – 2014-12-21 (×6): 12.5 mg via ORAL
  Filled 2014-12-18 (×7): qty 1

## 2014-12-18 MED ORDER — ETOMIDATE 2 MG/ML IV SOLN
0.3000 mg/kg | Freq: Once | INTRAVENOUS | Status: AC
  Administered 2014-12-18: 20 mg via INTRAVENOUS

## 2014-12-18 MED ORDER — HEPARIN (PORCINE) IN NACL 100-0.45 UNIT/ML-% IJ SOLN
1650.0000 [IU]/h | INTRAMUSCULAR | Status: DC
  Administered 2014-12-18: 1300 [IU]/h via INTRAVENOUS
  Filled 2014-12-18 (×2): qty 250

## 2014-12-18 MED ORDER — SODIUM CHLORIDE 0.9 % IV SOLN: INTRAVENOUS | Status: DC

## 2014-12-18 MED ORDER — SODIUM CHLORIDE 0.9 % IV SOLN
2.0000 g | Freq: Once | INTRAVENOUS | Status: AC
  Administered 2014-12-18: 2 g via INTRAVENOUS
  Filled 2014-12-18: qty 20

## 2014-12-18 MED ORDER — MIDAZOLAM HCL 2 MG/2ML IJ SOLN
INTRAMUSCULAR | Status: AC
  Administered 2014-12-18: 2 mg
  Filled 2014-12-18: qty 2

## 2014-12-18 MED ORDER — FENTANYL BOLUS VIA INFUSION
50.0000 ug | INTRAVENOUS | Status: DC | PRN
  Administered 2014-12-19 (×6): 50 ug via INTRAVENOUS
  Filled 2014-12-18: qty 50

## 2014-12-18 MED ORDER — FENTANYL CITRATE (PF) 100 MCG/2ML IJ SOLN
100.0000 ug | INTRAMUSCULAR | Status: DC | PRN
  Administered 2014-12-18 (×8): 100 ug via INTRAVENOUS
  Filled 2014-12-18 (×8): qty 2

## 2014-12-18 NOTE — Progress Notes (Signed)
CRITICAL VALUE ALERT  Critical value received:  Calcium 5.1  Date of notification:  12/18/14   Time of notification:  2107  Critical value read back:Yes.    Nurse who received alert:  Remonia RichterLindsey Walsh RN  MD notified (1st page):  eLink MD  Time of first page:  2108  Responding MD:  Arsenio LoaderSommer, MD  Time MD responded:  2108

## 2014-12-18 NOTE — Consult Note (Addendum)
Reason for Consult: elevated troponin and 35 beat run NSVT Referring Physician: Dr. Oletta Darter Primary Cardiologist: new  Brent Rasmussen is an 54 y.o. male.  HPI: Brent Rasmussen is a 54 yo man with PMH of T2DM, CAD (prior stent 5-10 years ago at [facility]), Asthma who developed abdominal pain with associated nausea/vomiting a week ago Saturday(11/19) with progressive pain that led him to seek urgent care at [facility] where he was diagnosed with acute pancreatitis with hypertriglyceridemia and DKA leading to transfer to [facility]. He has been treated in the ICU with critical illness. Cardiology consulted this evening given a run of NSVT (35 beats) in setting of renal failure and K 3.0 with troponin of 4.6. His ECG on 11/22 revealed sinus tachycardia with poor R-waves in V1-V2 consistent with possible previous anterior MI but his 11/26 ECG revealed diffuse anterolateral MI, age indeterminate with no R-waves across the entire precordium. He is on aspirin and metoprolol. He is accompanied by his wife. She provided some history and he could contribute some but he is intubated. He had a stent 5-10 years ago because of a stress test. Tonight he did notice palpitations but he has pain all over so difficult to determine any new pain. [name] says he has told her that he has not had any recent dark stools or bleeding issues. He has not had any chest pain or significant dyspnea at home leading up to this hospitalization. He had previously been on insulin but lost a lot of weight and was able to get off. He does not drink or smoke.  Moderate   Past Medical History  Diagnosis Date  . Diabetes mellitus without complication ([place])   . Coronary artery disease   . Asthma   . Bell's palsy     Past Surgical History  Procedure Laterality Date  . Cardiac catheterization    . Coronary angioplasty with stent placement      No family history on file. No known family history of CAD or pancreatitis but he  is adopted Social History:  has no tobacco, alcohol, and drug history on file. No alcohol or drugs Allergies:  Allergies  Allergen Reactions  . Sulfa Antibiotics Other (See Comments)    Asthma attack    Medications:  I have reviewed the patient's current medications. Prior to Admission:  Prescriptions prior to admission  Medication Sig Dispense Refill Last Dose  . atorvastatin (LIPITOR) 20 MG tablet Take 20 mg by mouth daily.  11 Past Week at Unknown time  . canagliflozin (INVOKANA) 300 MG TABS tablet Take 300 mg by mouth daily.   Past Week at Unknown time  . ciprofloxacin (CIPRO) 500 MG tablet Take 500 mg by mouth 2 (two) times daily.   12/14/2014 at Unknown time  . fenofibrate (TRICOR) 145 MG tablet Take 145 mg by mouth daily.   Past Week at Unknown time  . gabapentin (NEURONTIN) 300 MG capsule Take 300 mg by mouth every evening.  3 Past Week at Unknown time  . levocetirizine (XYZAL) 5 MG tablet Take 5 mg by mouth at bedtime as needed for allergies.    Past Month at Unknown time  . losartan (COZAAR) 50 MG tablet Take 50 mg by mouth daily.    Past Week at Unknown time  . metFORMIN (GLUCOPHAGE) 1000 MG tablet Take 1,000 mg by mouth 2 (two) times daily.   Past Week at Unknown time  . metoprolol succinate (TOPROL-XL) 100 MG 24 hr tablet Take  100 mg by mouth daily.   Past Week at [employer]  . Olopatadine HCl (PAZEO) 0.7 % SOLN Apply 1 drop to eye as needed (irritation).    Past Week at Unknown time  . SYMBICORT 160-4.5 MCG/ACT inhaler Inhale 2 puffs into the lungs 2 (two) times daily as needed (shortness of breath).   10 Past Month at Unknown time  . VASCEPA 1 G CAPS Take 2 g by mouth 2 (two) times daily.  3 Past Week at Unknown time  . VICTOZA 18 MG/3ML SOPN ADM 1.8 MG [place] D  3 Past Week at Unknown time   Scheduled: . antiseptic oral rinse  7 mL Mouth Rinse QID  . aspirin  325 mg Oral Daily  . chlorhexidine gluconate  15 mL Mouth Rinse BID  . furosemide  80 mg Intravenous Q12H  . heparin   5,000 Units Subcutaneous 3 times per day  . hydrocortisone sodium succinate  50 mg Intravenous Q6H  . imipenem-cilastatin  250 mg Intravenous 4 times per day  . Influenza vac split quadrivalent PF  0.5 mL Intramuscular Tomorrow-1000  . insulin aspart  0-20 Units Subcutaneous 6 times per day  . metoprolol tartrate  12.5 mg Oral BID  . pantoprazole (PROTONIX) IV  40 mg Intravenous Q24H  . vancomycin  1,500 mg Intravenous Q48H    Results for orders placed or performed during the hospital encounter of 12/14/14 (from the past 48 hour(s))  Glucose, capillary     Status: Abnormal   Collection Time: 12/16/14  9:00 PM  Result Value Ref Range   Glucose-Capillary 138 (H) 65 - 99 mg/dL  Glucose, capillary     Status: Abnormal   Collection Time: 12/16/14 10:04 PM  Result Value Ref Range   Glucose-Capillary 131 (H) 65 - 99 mg/dL  Basic metabolic panel     Status: Abnormal   Collection Time: 12/16/14 10:09 PM  Result Value Ref Range   Sodium 130 (L) 135 - 145 mmol/L   Potassium 3.0 (L) 3.5 - 5.1 mmol/L   Chloride 104 101 - 111 mmol/L   CO2 15 (L) 22 - 32 mmol/L   Glucose, Bld 139 (H) 65 - 99 mg/dL   BUN 65 (H) 6 - 20 mg/dL   Creatinine, Ser 4.76 (H) 0.61 - 1.24 mg/dL   Calcium 5.2 (LL) 8.9 - 10.3 mg/dL    Comment: CRITICAL RESULT CALLED TO, READ BACK BY AND VERIFIED WITH: Benji Poynter K,RN 12/16/14 2319 WAYK    GFR calc non Af Amer 13 (L) >60 mL/min   GFR calc Af Amer 15 (L) >60 mL/min    Comment: (NOTE) The eGFR has been calculated using the CKD EPI equation. This calculation has not been validated in all clinical situations. eGFR's persistently <60 mL/min signify possible Chronic Kidney Disease.    Anion gap 11 5 - 15  Glucose, capillary     Status: Abnormal   Collection Time: 12/16/14 11:08 PM  Result Value Ref Range   Glucose-Capillary 140 (H) 65 - 99 mg/dL  Glucose, capillary     Status: Abnormal   Collection Time: 12/17/14 12:10 AM  Result Value Ref Range   Glucose-Capillary 140 (H)  65 - 99 mg/dL  Glucose, capillary     Status: Abnormal   Collection Time: 12/17/14  1:17 AM  Result Value Ref Range   Glucose-Capillary 140 (H) 65 - 99 mg/dL  Glucose, capillary     Status: Abnormal   Collection Time: 12/17/14  2:15 AM  Result Value Ref  Range   Glucose-Capillary 141 (H) 65 - 99 mg/dL  Procalcitonin     Status: None   Collection Time: 12/17/14  2:16 AM  Result Value Ref Range   Procalcitonin 2.09 ng/mL    Comment:        Interpretation: PCT > 2 ng/mL: Systemic infection (sepsis) is likely, unless other causes are known. (NOTE)         ICU PCT Algorithm               Non ICU PCT Algorithm    ----------------------------     ------------------------------         PCT < 0.25 ng/mL                 PCT < 0.1 ng/mL     Stopping of antibiotics            Stopping of antibiotics       strongly encouraged.               strongly encouraged.    ----------------------------     ------------------------------       PCT level decrease by               PCT < 0.25 ng/mL       >= 80% from peak PCT       OR PCT 0.25 - 0.5 ng/mL          Stopping of antibiotics                                             encouraged.     Stopping of antibiotics           encouraged.    ----------------------------     ------------------------------       PCT level decrease by              PCT >= 0.25 ng/mL       < 80% from peak PCT        AND PCT >= 0.5 ng/mL            Continuing antibiotics                                               encouraged.       Continuing antibiotics            encouraged.    ----------------------------     ------------------------------     PCT level increase compared          PCT > 0.5 ng/mL         with peak PCT AND          PCT >= 0.5 ng/mL             Escalation of antibiotics                                          strongly encouraged.      Escalation of antibiotics        strongly encouraged.   Comprehensive metabolic panel     Status: Abnormal    Collection Time: 12/17/14  2:16 AM  Result Value Ref Range   Sodium 130 (L) 135 - 145 mmol/L   Potassium 3.0 (L) 3.5 - 5.1 mmol/L   Chloride 103 101 - 111 mmol/L   CO2 15 (L) 22 - 32 mmol/L   Glucose, Bld 148 (H) 65 - 99 mg/dL   BUN 67 (H) 6 - 20 mg/dL   Creatinine, Ser 4.87 (H) 0.61 - 1.24 mg/dL   Calcium 5.6 (LL) 8.9 - 10.3 mg/dL    Comment: CRITICAL RESULT CALLED TO, READ BACK BY AND VERIFIED WITH: Kassem Kibbe K,RN 12/17/14 0300 WAYK    Total Protein 4.2 (L) 6.5 - 8.1 g/dL   Albumin 1.5 (L) 3.5 - 5.0 g/dL   AST 133 (H) 15 - 41 U/L   ALT 98 (H) 17 - 63 U/L   Alkaline Phosphatase 41 38 - 126 U/L   Total Bilirubin 0.5 0.3 - 1.2 mg/dL   GFR calc non Af Amer 12 (L) >60 mL/min   GFR calc Af Amer 14 (L) >60 mL/min    Comment: (NOTE) The eGFR has been calculated using the CKD EPI equation. This calculation has not been validated in all clinical situations. eGFR's persistently <60 mL/min signify possible Chronic Kidney Disease.    Anion gap 12 5 - 15  Glucose, capillary     Status: Abnormal   Collection Time: 12/17/14  3:07 AM  Result Value Ref Range   Glucose-Capillary 148 (H) 65 - 99 mg/dL  Glucose, capillary     Status: Abnormal   Collection Time: 12/17/14  4:02 AM  Result Value Ref Range   Glucose-Capillary 139 (H) 65 - 99 mg/dL  Glucose, capillary     Status: Abnormal   Collection Time: 12/17/14  5:20 AM  Result Value Ref Range   Glucose-Capillary 159 (H) 65 - 99 mg/dL  Basic metabolic panel     Status: Abnormal   Collection Time: 12/17/14  5:51 AM  Result Value Ref Range   Sodium 129 (L) 135 - 145 mmol/L   Potassium 2.9 (L) 3.5 - 5.1 mmol/L   Chloride 102 101 - 111 mmol/L   CO2 14 (L) 22 - 32 mmol/L   Glucose, Bld 161 (H) 65 - 99 mg/dL   BUN 68 (H) 6 - 20 mg/dL   Creatinine, Ser 4.72 (H) 0.61 - 1.24 mg/dL   Calcium 5.0 (LL) 8.9 - 10.3 mg/dL    Comment: CRITICAL RESULT CALLED TO, READ BACK BY AND VERIFIED WITH: Larkyn Greenberger K,RN 12/17/14 0618 WAYK    GFR calc non Af Amer 13  (L) >60 mL/min   GFR calc Af Amer 15 (L) >60 mL/min    Comment: (NOTE) The eGFR has been calculated using the CKD EPI equation. This calculation has not been validated in all clinical situations. eGFR's persistently <60 mL/min signify possible Chronic Kidney Disease.    Anion gap 13 5 - 15  PTH, intact and calcium     Status: Abnormal   Collection Time: 12/17/14  5:51 AM  Result Value Ref Range   PTH 87 (H) 15 - 65 pg/mL   Calcium, Total (PTH) 4.8 (LL) 8.7 - 10.2 mg/dL    Comment: **Verified by repeat analysis**   PTH Comment     Comment: (NOTE) Interpretation                 Intact PTH    Calcium                                (  pg/mL)      (mg/dL) Normal                          15 - 65     8.6 - 10.2 Primary Hyperparathyroidism         >65          >10.2 Secondary Hyperparathyroidism       >65          <10.2 Non-Parathyroid Hypercalcemia       <65          >10.2 Hypoparathyroidism                  <15          < 8.6 Non-Parathyroid Hypocalcemia    15 - 65          < 8.6 Test(s) Calcium, Serum called to [facility] on 12/18/2014 at 03:37 EST Performed At: [facility] [place], [place] [id number] Lindon Romp MD RA:[id number]   Glucose, capillary     Status: Abnormal   Collection Time: 12/17/14  6:09 AM  Result Value Ref Range   Glucose-Capillary 166 (H) 65 - 99 mg/dL  Glucose, capillary     Status: Abnormal   Collection Time: 12/17/14  6:54 AM  Result Value Ref Range   Glucose-Capillary 136 (H) 65 - 99 mg/dL  I-STAT 3, arterial blood gas (G3+)     Status: Abnormal   Collection Time: 12/17/14  7:18 AM  Result Value Ref Range   pH, Arterial 7.446 7.350 - 7.450   pCO2 arterial 22.7 (L) 35.0 - 45.0 mmHg   pO2, Arterial 160.0 (H) 80.0 - 100.0 mmHg   Bicarbonate 15.7 (L) 20.0 - 24.0 mEq/L   TCO2 16 0 - 100 mmol/L   O2 Saturation 100.0 %   Acid-base deficit 7.0 (H) 0.0 - 2.0 mmol/L   Patient temperature 98.6 F    Collection site RADIAL,  ALLEN'S TEST ACCEPTABLE    Drawn by RT    Sample type ARTERIAL   Basic metabolic panel     Status: Abnormal   Collection Time: 12/17/14  7:50 AM  Result Value Ref Range   Sodium 130 (L) 135 - 145 mmol/L   Potassium 3.2 (L) 3.5 - 5.1 mmol/L   Chloride 101 101 - 111 mmol/L   CO2 17 (L) 22 - 32 mmol/L   Glucose, Bld 178 (H) 65 - 99 mg/dL   BUN 69 (H) 6 - 20 mg/dL   Creatinine, Ser 4.88 (H) 0.61 - 1.24 mg/dL   Calcium 5.0 (LL) 8.9 - 10.3 mg/dL    Comment: CRITICAL RESULT CALLED TO, READ BACK BY AND VERIFIED WITH: MICHELLE TOLER,RN AT 1014 12/17/14 BY ZBEECH.    GFR calc non Af Amer 12 (L) >60 mL/min   GFR calc Af Amer 14 (L) >60 mL/min    Comment: (NOTE) The eGFR has been calculated using the CKD EPI equation. This calculation has not been validated in all clinical situations. eGFR's persistently <60 mL/min signify possible Chronic Kidney Disease.    Anion gap 12 5 - 15  CBC with Differential/Platelet     Status: Abnormal   Collection Time: 12/17/14  7:50 AM  Result Value Ref Range   WBC 8.6 4.0 - 10.5 K/uL    Comment: REPEATED TO VERIFY   RBC 3.87 (L) 4.22 - 5.81 MIL/uL   Hemoglobin 11.4 (L) 13.0 - 17.0 g/dL  Comment: REPEATED TO VERIFY   HCT 31.5 (L) 39.0 - 52.0 %   MCV 81.4 78.0 - 100.0 fL   MCH 29.5 26.0 - 34.0 pg   MCHC 36.2 (H) 30.0 - 36.0 g/dL   RDW 15.2 11.5 - 15.5 %   Platelets 72 (L) 150 - 400 K/uL    Comment: REPEATED TO VERIFY CONSISTENT WITH PREVIOUS RESULT    Neutrophils Relative % 88 %   Neutro Abs 7.5 1.7 - 7.7 K/uL   Lymphocytes Relative 6 %   Lymphs Abs 0.5 (L) 0.7 - 4.0 K/uL   Monocytes Relative 6 %   Monocytes Absolute 0.5 0.1 - 1.0 K/uL   Eosinophils Relative 0 %   Eosinophils Absolute 0.0 0.0 - 0.7 K/uL   Basophils Relative 0 %   Basophils Absolute 0.0 0.0 - 0.1 K/uL  Glucose, capillary     Status: Abnormal   Collection Time: 12/17/14  7:54 AM  Result Value Ref Range   Glucose-Capillary 176 (H) 65 - 99 mg/dL  Glucose, capillary     Status:  Abnormal   Collection Time: 12/17/14  8:49 AM  Result Value Ref Range   Glucose-Capillary 161 (H) 65 - 99 mg/dL  I-STAT 3, arterial blood gas (G3+)     Status: Abnormal   Collection Time: 12/17/14  9:25 AM  Result Value Ref Range   pH, Arterial 7.338 (L) 7.350 - 7.450   pCO2 arterial 28.8 (L) 35.0 - 45.0 mmHg   pO2, Arterial 117.0 (H) 80.0 - 100.0 mmHg   Bicarbonate 15.4 (L) 20.0 - 24.0 mEq/L   TCO2 16 0 - 100 mmol/L   O2 Saturation 98.0 %   Acid-base deficit 9.0 (H) 0.0 - 2.0 mmol/L   Patient temperature 37.1 C    Collection site ARTERIAL LINE    Sample type ARTERIAL   Glucose, capillary     Status: Abnormal   Collection Time: 12/17/14  9:53 AM  Result Value Ref Range   Glucose-Capillary 167 (H) 65 - 99 mg/dL  Glucose, capillary     Status: Abnormal   Collection Time: 12/17/14 11:04 AM  Result Value Ref Range   Glucose-Capillary 137 (H) 65 - 99 mg/dL  Basic metabolic panel     Status: Abnormal   Collection Time: 12/17/14  2:47 PM  Result Value Ref Range   Sodium 131 (L) 135 - 145 mmol/L   Potassium 3.3 (L) 3.5 - 5.1 mmol/L   Chloride 102 101 - 111 mmol/L   CO2 17 (L) 22 - 32 mmol/L   Glucose, Bld 211 (H) 65 - 99 mg/dL   BUN 70 (H) 6 - 20 mg/dL   Creatinine, Ser 4.89 (H) 0.61 - 1.24 mg/dL   Calcium 4.9 (LL) 8.9 - 10.3 mg/dL    Comment: CRITICAL RESULT CALLED TO, READ BACK BY AND VERIFIED WITH: RN DEABRAT,J AT 1515 [id number] MARTINB    GFR calc non Af Amer 12 (L) >60 mL/min   GFR calc Af Amer 14 (L) >60 mL/min    Comment: (NOTE) The eGFR has been calculated using the CKD EPI equation. This calculation has not been validated in all clinical situations. eGFR's persistently <60 mL/min signify possible Chronic Kidney Disease.    Anion gap 12 5 - 15  Glucose, capillary     Status: Abnormal   Collection Time: 12/17/14  3:25 PM  Result Value Ref Range   Glucose-Capillary 190 (H) 65 - 99 mg/dL  Glucose, capillary     Status: Abnormal   Collection  Time: 12/17/14  7:20 PM    Result Value Ref Range   Glucose-Capillary 237 (H) 65 - 99 mg/dL  Lactic acid, plasma     Status: None   Collection Time: 12/17/14  9:55 PM  Result Value Ref Range   Lactic Acid, Venous 0.8 0.5 - 2.0 mmol/L  Glucose, capillary     Status: Abnormal   Collection Time: 12/17/14 11:18 PM  Result Value Ref Range   Glucose-Capillary 216 (H) 65 - 99 mg/dL  Lactic acid, plasma     Status: None   Collection Time: 12/18/14 12:36 AM  Result Value Ref Range   Lactic Acid, Venous 0.7 0.5 - 2.0 mmol/L  Glucose, capillary     Status: Abnormal   Collection Time: 12/18/14  3:25 AM  Result Value Ref Range   Glucose-Capillary 214 (H) 65 - 99 mg/dL   Comment 1 Notify RN    Comment 2 Document in Chart   Procalcitonin     Status: None   Collection Time: 12/18/14  4:01 AM  Result Value Ref Range   Procalcitonin 0.86 ng/mL    Comment:        Interpretation: PCT > 0.5 ng/mL and <= 2 ng/mL: Systemic infection (sepsis) is possible, but other conditions are known to elevate PCT as well. (NOTE)         ICU PCT Algorithm               Non ICU PCT Algorithm    ----------------------------     ------------------------------         PCT < 0.25 ng/mL                 PCT < 0.1 ng/mL     Stopping of antibiotics            Stopping of antibiotics       strongly encouraged.               strongly encouraged.    ----------------------------     ------------------------------       PCT level decrease by               PCT < 0.25 ng/mL       >= 80% from peak PCT       OR PCT 0.25 - 0.5 ng/mL          Stopping of antibiotics                                             encouraged.     Stopping of antibiotics           encouraged.    ----------------------------     ------------------------------       PCT level decrease by              PCT >= 0.25 ng/mL       < 80% from peak PCT        AND PCT >= 0.5 ng/mL             Continuing antibiotics                                              encouraged.  Continuing antibiotics            encouraged.    ----------------------------     ------------------------------     PCT level increase compared          PCT > 0.5 ng/mL         with peak PCT AND          PCT >= 0.5 ng/mL             Escalation of antibiotics                                          strongly encouraged.      Escalation of antibiotics        strongly encouraged.   Comprehensive metabolic panel     Status: Abnormal   Collection Time: 12/18/14  4:01 AM  Result Value Ref Range   Sodium 133 (L) 135 - 145 mmol/L   Potassium 3.7 3.5 - 5.1 mmol/L   Chloride 100 (L) 101 - 111 mmol/L   CO2 18 (L) 22 - 32 mmol/L   Glucose, Bld 237 (H) 65 - 99 mg/dL   BUN 78 (H) 6 - 20 mg/dL   Creatinine, Ser 5.30 (H) 0.61 - 1.24 mg/dL   Calcium 4.9 (LL) 8.9 - 10.3 mg/dL    Comment: CRITICAL RESULT CALLED TO, READ BACK BY AND VERIFIED WITH: [facility] L,RN 12/18/14 0430 WAYK    Total Protein 4.6 (L) 6.5 - 8.1 g/dL   Albumin 1.6 (L) 3.5 - 5.0 g/dL   AST 49 (H) 15 - 41 U/L   ALT 58 17 - 63 U/L   Alkaline Phosphatase 50 38 - 126 U/L   Total Bilirubin 1.1 0.3 - 1.2 mg/dL   GFR calc non Af Amer 11 (L) >60 mL/min   GFR calc Af Amer 13 (L) >60 mL/min    Comment: (NOTE) The eGFR has been calculated using the CKD EPI equation. This calculation has not been validated in all clinical situations. eGFR's persistently <60 mL/min signify possible Chronic Kidney Disease.    Anion gap 15 5 - 15  Glucose, capillary     Status: Abnormal   Collection Time: 12/18/14  7:49 AM  Result Value Ref Range   Glucose-Capillary 211 (H) 65 - 99 mg/dL  I-STAT 3, arterial blood gas (G3+)     Status: Abnormal   Collection Time: 12/18/14  7:51 AM  Result Value Ref Range   pH, Arterial 7.295 (L) 7.350 - 7.450   pCO2 arterial 40.9 35.0 - 45.0 mmHg   pO2, Arterial 71.0 (L) 80.0 - 100.0 mmHg   Bicarbonate 19.9 (L) 20.0 - 24.0 mEq/L   TCO2 21 0 - 100 mmol/L   O2 Saturation 92.0 %   Acid-base deficit 6.0 (H) 0.0 - 2.0  mmol/L   Patient temperature 37.0 C    Collection site ARTERIAL LINE    Drawn by Operator    Sample type ARTERIAL   Amylase     Status: Abnormal   Collection Time: 12/18/14  8:30 AM  Result Value Ref Range   Amylase 144 (H) 28 - 100 U/L  Lipase, blood     Status: Abnormal   Collection Time: 12/18/14  8:30 AM  Result Value Ref Range   Lipase 110 (H) 11 - 51 U/L  Basic metabolic panel     Status: Abnormal   Collection Time: 12/18/14  8:30 AM  Result Value Ref Range   Sodium 133 (L) 135 - 145 mmol/L   Potassium 3.6 3.5 - 5.1 mmol/L   Chloride 101 101 - 111 mmol/L   CO2 18 (L) 22 - 32 mmol/L   Glucose, Bld 218 (H) 65 - 99 mg/dL   BUN 80 (H) 6 - 20 mg/dL   Creatinine, Ser 5.11 (H) 0.61 - 1.24 mg/dL   Calcium 4.7 (LL) 8.9 - 10.3 mg/dL    Comment: CRITICAL RESULT CALLED TO, READ BACK BY AND VERIFIED WITH: PHYLLIS [facility] RN AT 1657 12/18/14 BY WOOLLENK    GFR calc non Af Amer 12 (L) >60 mL/min   GFR calc Af Amer 13 (L) >60 mL/min    Comment: (NOTE) The eGFR has been calculated using the CKD EPI equation. This calculation has not been validated in all clinical situations. eGFR's persistently <60 mL/min signify possible Chronic Kidney Disease.    Anion gap 14 5 - 15  I-STAT 3, arterial blood gas (G3+)     Status: Abnormal   Collection Time: 12/18/14 10:43 AM  Result Value Ref Range   pH, Arterial 7.436 7.350 - 7.450   pCO2 arterial 29.5 (L) 35.0 - 45.0 mmHg   pO2, Arterial 269.0 (H) 80.0 - 100.0 mmHg   Bicarbonate 19.9 (L) 20.0 - 24.0 mEq/L   TCO2 21 0 - 100 mmol/L   O2 Saturation 100.0 %   Acid-base deficit 3.0 (H) 0.0 - 2.0 mmol/L   Patient temperature 37.0 C    Collection site ARTERIAL LINE    Drawn by Operator    Sample type ARTERIAL   Glucose, capillary     Status: Abnormal   Collection Time: 12/18/14 12:03 PM  Result Value Ref Range   Glucose-Capillary 195 (H) 65 - 99 mg/dL  Basic metabolic panel     Status: Abnormal   Collection Time: 12/18/14  3:14 PM  Result  Value Ref Range   Sodium 136 135 - 145 mmol/L   Potassium 3.0 (L) 3.5 - 5.1 mmol/L   Chloride 102 101 - 111 mmol/L   CO2 18 (L) 22 - 32 mmol/L   Glucose, Bld 202 (H) 65 - 99 mg/dL   BUN 81 (H) 6 - 20 mg/dL   Creatinine, Ser 4.97 (H) 0.61 - 1.24 mg/dL   Calcium 5.0 (LL) 8.9 - 10.3 mg/dL    Comment: CRITICAL RESULT CALLED TO, READ BACK BY AND VERIFIED WITH: MCKINNEY,F RN 12/18/14 1539 WOOTEN,K    GFR calc non Af Amer 12 (L) >60 mL/min   GFR calc Af Amer 14 (L) >60 mL/min    Comment: (NOTE) The eGFR has been calculated using the CKD EPI equation. This calculation has not been validated in all clinical situations. eGFR's persistently <60 mL/min signify possible Chronic Kidney Disease.    Anion gap 16 (H) 5 - 15  Glucose, capillary     Status: Abnormal   Collection Time: 12/18/14  4:05 PM  Result Value Ref Range   Glucose-Capillary 209 (H) 65 - 99 mg/dL  Troponin I (q 6hr x 3)     Status: Abnormal   Collection Time: 12/18/14  6:17 PM  Result Value Ref Range   Troponin I 4.35 (HH) <0.031 ng/mL    Comment:        POSSIBLE MYOCARDIAL ISCHEMIA. SERIAL TESTING RECOMMENDED. CRITICAL RESULT CALLED TO, READ BACK BY AND VERIFIED WITH: MCKIMMEY,F RN 12/18/14 1856 WOOTEN,K   Glucose, capillary     Status: Abnormal   Collection Time:  12/18/14  7:47 PM  Result Value Ref Range   Glucose-Capillary 162 (H) 65 - 99 mg/dL   Comment 1 Notify RN     Ct Abdomen Pelvis Wo Contrast  12/18/2014  CLINICAL DATA:  Acute onset of generalized abdominal pain and distention. Respiratory stress. Initial encounter. EXAM: CT ABDOMEN AND PELVIS WITHOUT CONTRAST TECHNIQUE: Multidetector CT imaging of the abdomen and pelvis was performed following the standard protocol without IV contrast. COMPARISON:  CT of the abdomen and pelvis performed 12/14/2014 FINDINGS: Trace right and small left pleural effusions are noted, with associated atelectasis. Scattered coronary artery calcifications are seen. New small volume  ascites is seen within the abdomen and pelvis. There is diffusely worsened soft tissue edema and inflammation noted about the entirety of the pancreas. Evaluation for devascularization is limited without contrast. No pseudocyst formation is characterized at this time. Associated free fluid tracks about the adjacent stomach, and extends along Gerota's fascia bilaterally. The liver and spleen are unremarkable in appearance. The gallbladder is within normal limits. The pancreas and adrenal glands are unremarkable. Mild nonspecific perinephric stranding is noted bilaterally. The kidneys are otherwise unremarkable. There is no evidence of hydronephrosis. No renal or ureteral stones are identified. No free fluid is identified. The small bowel is unremarkable in appearance. The stomach is within normal limits. No acute vascular abnormalities are seen. The patient's enteric tube is seen ending at the body of the stomach. The appendix is not definitely characterized; there is no evidence of appendicitis. The colon is largely decompressed and is unremarkable in appearance. The bladder is decompressed, with a Foley catheter in place. The prostate remains normal in size. No inguinal lymphadenopathy is seen. No acute osseous abnormalities are identified. IMPRESSION: 1. Worsening acute pancreatitis noted, with worsened soft tissue edema and inflammation noted about the entirety of the pancreas. Evaluation for devascularization is limited without contrast. No evidence of pseudocyst formation at this time. Associated free fluid tracks about the adjacent stomach, and extends along Gerota's fascia bilaterally. 2. New small volume ascites within the abdomen and pelvis. 3. Trace right and small left pleural effusions, new from the prior study, with associated atelectasis. 4. Scattered coronary artery calcifications seen. Electronically Signed   By: Garald Balding M.D.   On: 12/18/2014 01:58   Dg Chest Port 1 View  12/18/2014   CLINICAL DATA:  Feeding tube placement EXAM: PORTABLE CHEST 1 VIEW COMPARISON:  12/18/2014 FINDINGS: Cardiomediastinal silhouette is stable. Persistent left base atelectasis or infiltrate. Endotracheal tube is unchanged in position. NG tube is unchanged in position. There is a second NG feeding tube with tip in mid stomach. No pneumothorax. Left subclavian central line is unchanged in position. IMPRESSION: Persistent left base atelectasis or infiltrate. Endotracheal tube is unchanged in position. NG tube is unchanged in position. There is a second NG feeding tube with tip in mid stomach. No pneumothorax. Left subclavian central line is unchanged in position. Electronically Signed   By: Lahoma Crocker M.D.   On: 12/18/2014 18:42   Dg Chest Port 1 View  12/18/2014  CLINICAL DATA:  Pancreatitis. EXAM: PORTABLE CHEST 1 VIEW COMPARISON:  12/17/2014 FINDINGS: Nasogastric tube has tip over the stomach in the left upper quadrant. Left subclavian central venous catheter unchanged with tip overlying the SVC. Interval extubation. Lungs are hypoinflated with slight worsening opacification in the left base likely small effusion with atelectasis. Mild stable cardiomegaly. Remainder of the exam is unchanged. IMPRESSION: Hypoinflation with slight worsening opacification left base likely small effusion with atelectasis.  Stable cardiomegaly. Tubes and lines as described. Electronically Signed   By: Marin Olp M.D.   On: 12/18/2014 09:34   Dg Chest Port 1 View  12/18/2014  CLINICAL DATA:  Post intubation.  Intentional self poisoning. EXAM: PORTABLE CHEST 1 VIEW COMPARISON:  12/18/2014 and 12/17/2014 FINDINGS: Nasogastric tube courses into the region of the stomach and off the film as the tip is not visualized. Left subclavian central venous catheter with tip overlying the SVC. Tip of endotracheal tube partially obscured by overlying EKG wires as tip appears to be approximately 2 cm above the carina. Lungs are hypoinflated with  persistent left base opacification likely atelectasis/effusion. Minimal right perihilar opacification. Mild stable cardiomegaly. Remainder of the exam is unchanged. IMPRESSION: Hypoinflation. Mild left basilar opacification likely atelectasis and small effusion. Mild right perihilar opacification likely atelectasis. Tubes and lines as described. Electronically Signed   By: Marin Olp M.D.   On: 12/18/2014 09:19   Portable Chest Xray  12/17/2014  CLINICAL DATA:  Shortness of breath. EXAM: PORTABLE CHEST 1 VIEW COMPARISON:  12/16/2014. FINDINGS: Endotracheal tube and NG tube in stable position. Mediastinum and hilar structures are normal. Stable cardiomegaly. Bibasilar subsegmental atelectasis again noted. Developing infiltrate left lower lobe cannot be excluded. No pleural effusion or pneumothorax. IMPRESSION: 1. Lines and tubes in stable position. 2. Persistent bibasilar subsegmental atelectasis. Developing left lower lobe infiltrate cannot be excluded . 3. Stable cardiomegaly . Electronically Signed   By: Marcello Moores  Register   On: 12/17/2014 07:38   Dg Abd Portable 1v  12/18/2014  CLINICAL DATA:  Enteric tube placed EXAM: PORTABLE ABDOMEN - 1 VIEW COMPARISON:  Abdominal radiograph 1 day prior FINDINGS: Enteric tube terminates in the proximal stomach with the side port in the very proximal stomach. No dilated small bowel loops or appreciable colonic stool. No evidence of pneumatosis or pneumoperitoneum. IMPRESSION: Enteric tube terminates in the proximal stomach. Electronically Signed   By: Ilona Sorrel M.D.   On: 12/18/2014 10:22   Dg Abd Portable 1v  12/17/2014  CLINICAL DATA:  NG tube placement. EXAM: PORTABLE ABDOMEN - 1 VIEW COMPARISON:  Chest x-ray earlier today. FINDINGS: There has been placement of a nasogastric tube with tip and side-port over the region of the stomach in the left upper quadrant. Bowel gas pattern is nonobstructive. Minimal degenerative change of the spine. IMPRESSION: No acute  findings. Nasogastric tube with tip and side-port over the stomach in the left upper quadrant. Electronically Signed   By: Marin Olp M.D.   On: 12/17/2014 16:45    Review of Systems  Unable to perform ROS: intubated  Cardiovascular: Positive for palpitations.   Blood pressure 116/71, pulse 103, temperature 100.2 F (37.9 C), temperature source Core (Comment), resp. rate 18, height 5' 10"  (1.778 m), weight 121.5 kg (267 lb 13.7 oz), SpO2 96 %. Physical Exam  Nursing note and vitals reviewed. Constitutional: He appears well-developed and well-nourished. He appears distressed.  Critically ill  HENT:  Head: Normocephalic and atraumatic.  Eyes: Conjunctivae are normal. Pupils are equal, round, and reactive to light. No scleral icterus.  PERRL - mildly sluggish  Neck: Normal range of motion. Neck supple. No JVD present. No tracheal deviation present.  Cardiovascular: Normal rate, regular rhythm, normal heart sounds and intact distal pulses.  Exam reveals no gallop.   No murmur heard. Respiratory:  Mechanical BS  GI: He exhibits distension. There is tenderness.  Soft but full, diffusely tender  Musculoskeletal: He exhibits edema. He exhibits no tenderness.  Trace  edema bilaterally  Neurological: He is alert.  Oriented to person/place/hospital, difficult to communicate given intubated  Skin: Skin is warm and dry. No rash noted. He is not diaphoretic. No erythema.  Psychiatric: He has a normal mood and affect. His behavior is normal. Thought content normal.    Assessment/Plan: Mr. Karge is a 54 yo man with PMH of T2DM, CAD (per chart), Asthma who developed abdominal pain/nausea/vomiting 1 week ago and was diagnosed with severe pancreatitis, hypertriglyceridemia and DKA. Cardiology consulted today for NSVT, anterolateral MI on ECG (age indeterminate) and elevated troponin. Given clinical syndrome and risk factors of T2DM, I favor this elevated troponin is consistent with at least Type II  MI and very possibly type I MI. He has had progressive loss of R-wave in his precordium. He remains critically ill with renal failure. At this time, I favor treatment with asa, metoprolol and start heparin gtt without bolus. Coronary angiogram at this time would injure his kidneys and his MI appears to have been greater than 12 hours and perhaps many days ago. For now, will treat medically and reassess timing of potential coronary angiogram.  Problem List NSTEMI - I favor type I > type II Acute pancreatitis  T2DM with recent DKA Hypertriglyceridemia Acute renal failure Thrombocytopenia  Plan:  1. Continue asa 81 mg daily 2. Start heparin gtt without bolus per pharmacy 3. Continue metoprolol 12.5 mg bid 4. Continue treatment for pancreatitis, T2DM 5. Consider addition of atorvastatin but differ to critical care team given multi-organ dysfunction/failure   Tiburcio Linder 12/18/2014, 8:47 PM

## 2014-12-18 NOTE — Progress Notes (Signed)
CRITICAL VALUE ALERT  Critical value received:  Troponin 4.35  Date of notification:  11/26  Time of notification:  1900  Critical value read back:Yes.    Nurse who received alert:  Acey LavPhyllis Denese Mentink  MD notified (1st page):  DR Arsenio LoaderSommer  Time of first page:  1900  MD notified (2nd page):  Time of second page:  Responding MD:  Dr Arsenio LoaderSommer  Time MD responded:  1900

## 2014-12-18 NOTE — Progress Notes (Signed)
Labs called to Magda PaganiniAudrey RN in [place] for MD K+ 3.0 crea 4.9 and calcium 5.0

## 2014-12-18 NOTE — Progress Notes (Signed)
PULMONARY / CRITICAL CARE MEDICINE   Name:Brent Rasmussen GMW:[id number] DOB:[birth year]   ADMISSION DATE: 12/14/2014 CONSULTATION DATE: 12/14/14  REFERRING MD : [facility]  CHIEF COMPLAINT: DKA, pancreatitis  INITIAL PRESENTATION: Brent Rasmussen is a 54 y.o. male that presented to [facility] for abdominal pain. He was found to have pancreatitis and be in DKA. There he was provided 3.5 L bolus. He was started on an insulin drip and was transferred to [facility] for care.   STUDIES:  11/22 MRI brain: No acute processes 11/22 CT abdomen: Moderate to severe acute pancreatitis without fluid collection, abscess or pseudocyst. 11/25 repeat CT >>>worsening diffuse pancreatitis, no abscess,, cysts  SIGNIFICANT EVENTS: 11/22 -Transferred to [facility] from [facility] 11/23-BM vasal vagals, cpr 2 min , ETT placed, on presors 11/24- aline placed, major improved pressors, volume given 11/25- extubated, escallating abdo pain 11/26- resp failure  SUBJECTIVE:  SOB, tachypnea noted   VITAL SIGNS: Temp: [94 F (34.4 C)-99.8 F (37.7 C)] 97.1 F (36.2 C) (11/23 0416) Pulse Rate: [114-135] 126 (11/23 0430) Resp: [20-33] 30 (11/23 0430) BP: (62-145)/(49-129) 95/74 mmHg (11/23 0430) SpO2: [96 %-100 %] 98 % (11/23 0430) Weight: [230 lb (104.327 kg)-231 lb 4.2 oz (104.9 kg)] 231 lb 4.2 oz (104.9 kg) (11/23 0415) HEMODYNAMICS:   VENTILATOR SETTINGS:   INTAKE / OUTPUT:  Intake/Output Summary (Last 24 hours) at 12/15/14 0452 Last data filed at 12/15/14 0400  Gross per 24 hour  Intake 3711.57 ml  Output  50 ml  Net 3661.57 ml    PHYSICAL EXAMINATION: General: rass -1 Neuro: Follows commands, no focal deficits, lethargic intermittent HEENT:line clean Cardiovascular: RRR, no murmurs Lungs: coarse Abdomen: distended hypo +BS, no r/g, soft , no abdo pain Musculoskeletal: Moves all extremities, nonpitting edema Skin:  Several healing excoriations on anterior shins bilaterally unchanged  LABS:  CBC  Last Labs      Recent Labs Lab 12/14/14 1810 12/14/14 2351 12/15/14 0345  WBC 15.4* 14.6* 11.3*  HGB 17.7* 17.8* 16.3  HCT 50.5 48.9 45.8  PLT 262 198 133*     Coag's  Last Labs      Recent Labs Lab 12/14/14 1810  INR 1.02     BMET  Last Labs      Recent Labs Lab 12/14/14 1810 12/14/14 2357  NA 128* 133*  K 4.2 4.0  CL 93* 104  CO2 6* 6*  BUN 37* 38*  CREATININE 1.87* 2.39*  GLUCOSE 485* 426*     Electrolytes  Last Labs      Recent Labs Lab 12/14/14 1810 12/14/14 2357  CALCIUM 9.8 8.4*     Sepsis Markers  Last Labs      Recent Labs Lab 12/14/14 1826 12/14/14 2023  LATICACIDVEN 6.59* 2.36*     ABG  Last Labs      Recent Labs Lab 12/15/14 0354  PHART 7.326*  PCO2ART 20.8*  PO2ART 101*     Liver Enzymes  Last Labs      Recent Labs Lab 12/14/14 1810  AST 32  ALT 22  ALKPHOS 73  BILITOT 1.9*  ALBUMIN 4.4     Cardiac Enzymes  Last Labs      Recent Labs Lab 12/14/14 1810  TROPONINI <0.03     Glucose  Last Labs      Recent Labs Lab 12/14/14 1817 12/14/14 2009 12/14/14 2133 12/15/14 0415  GLUCAP 490* 380* 395* 262*      Imaging Ct Abdomen Pelvis Wo  Contrast  12/14/2014 CLINICAL DATA: Altered mental status, reason UTI, hypothermia, diabetes, elevated lactate EXAM: CT ABDOMEN AND PELVIS WITHOUT CONTRAST TECHNIQUE: Multidetector CT imaging of the abdomen and pelvis was performed following the standard protocol without IV contrast. COMPARISON: None. FINDINGS: Lower chest: No acute findings. Hepatobiliary: No mass visualized on this un-enhanced exam. Pancreas: Diffuse peripancreatic stranding edema evident which also extends along the retroperitoneum, duodenum, and transverse mesocolon. Findings are compatible with moderate to severe acute  pancreatitis. No associated fluid collection, pseudocyst or abscess. No ductal dilatation. Spleen: Within normal limits in size. Adrenals/Urinary Tract: Normal adrenal glands. Minor nonspecific perinephric strandy edema without obstructing urinary tract or ureteral calculus. Stomach/Bowel: Slight thickening of the duodenum adjacent to the pancreas, suspect mild secondary duodenitis. No associated obstruction, ileus, or free air. Vascular/Lymphatic: No adenopathy. Negative for aneurysm. Minor atherosclerosis of the bifurcation and iliac vessels. Reproductive: No mass or other significant abnormality. Other: No inguinal abnormality all are hernia. Musculoskeletal: Minor degenerative changes of the lumbar spine. IMPRESSION: Moderate to severe acute pancreatitis without fluid collection, abscess or pseudocyst. Mild wall thickening of the adjacent duodenum, suspect reactive duodenitis secondary to the adjacent inflammatory process. No associated bowel obstruction or free air Electronically Signed By: Jerilynn Mages. Shick M.D. On: 12/14/2014 19:36   Ct Head Wo Contrast  12/14/2014 CLINICAL DATA: Patient with altered mental status. Recent diagnosis urinary tract infection. EXAM: CT HEAD WITHOUT CONTRAST TECHNIQUE: Contiguous axial images were obtained from the base of the skull through the vertex without intravenous contrast. COMPARISON: MRI brain 01/25/2009. FINDINGS: Ventricles and sulci are appropriate for patient's age. No evidence for acute cortically based infarct, intracranial hemorrhage, mass lesion mass-effect. Orbits are unremarkable. Paranasal sinuses are well aerated. Mastoid air cells are unremarkable. Calvarium is intact. IMPRESSION: No acute intracranial process. Electronically Signed By: Lovey Newcomer M.D. On: 12/14/2014 19:30   Dg Chest Port 1 View  12/14/2014 CLINICAL DATA: Altered Mental status, Elevated Blood/sugar/ Fever. HX: diabetes, CAD, Asthma, Cardiac Catherization EXAM: PORTABLE CHEST -  1 VIEW COMPARISON: 01/08/2010 FINDINGS: Low lung volumes. No focal infiltrate or overt edema. Heart size normal. No pneumothorax. No effusion. Visualized skeletal structures are unremarkable. IMPRESSION: Low lung volumes. No acute cardiopulmonary disease. Electronically Signed By: Lucrezia Europe M.D. On: 12/14/2014 19:31   ASSESSMENT / PLAN:  PULMONARY Acute resp failure ATX R/o ards P:  Dc dilauded Stat abg Requires emergent reintubation then peep 5, tv 7 cc/kg, abg to follow, pcxr to follow Keep plat less 30 Neg balance consideration  CARDIOVASCULAR Shock, SIRS, initially Hypovolemic, NOW hypervolemic P:  MAP goals met on own for now tele  RENAL AKI in the setting of DKA/ dehydration, ATN, 2 liters urine still ALL NONAG remains hypoKalemia R/o RTA 4 P:  Reduce bicarb or dc Lactic re assuring Lasix , he is overloaded, may need renal input, hd bmet q8h  GASTROINTESTINAL Severe pancreatitis, worsening edema on CT, at risk necrosis P:  Hold feeds ppi lft in am Keep NPO  abdo pressure if drops output urine ngt to ogt and suction int  HEMATOLOGIC dvt prevention, thrombocytopenia ( sirs) P:  Repeat CBC in am Sub q heparin , follow trend plat steady   INFECTIOUS Acute pancreatitis but no evidence of perforation, abscess or pseudocyst on CT  Shock 11/24 P:  BCx2 pending  ABX 11/24 Imipenem>>>  No fevers Repeat CT without asbcess  ENDOCRINE H/o diabetes type 2 DKA resolved, NO AG present - resolved P:  Dc  lantus ssi res  NEUROLOGIC Metabolic encephalopathy again, pain P:  Dc dilauded drip rass goal needs intubation  FAMILY  - Updates: I updated wife 11/25, will call after intubation  - Inter-disciplinary family meet or Palliative Care meeting due by: 12/22/14  Ccm time 30 min   Lavon Paganini. Titus Mould, MD, [place] Pgr: [facility] 12/18/2014 7:50 AM

## 2014-12-18 NOTE — Progress Notes (Signed)
Pt more difficult to rouse. Unable to stay awake long enough to complete sentances but  descibes pain as 5/10. Decreased Dilauded gtt to 0.5 mg/hr

## 2014-12-18 NOTE — Progress Notes (Signed)
Dilaudid infusion wasted and witnessed by Tory EmeraldMichelle Toler approx 50 mls

## 2014-12-18 NOTE — Progress Notes (Signed)
Initial Nutrition Assessment  DOCUMENTATION CODES:  Obesity unspecified  INTERVENTION:  When medically able, initiate TF via NGT with VHP at 25 ml/h and Prostat 30 ml BID on day 1; on day 2 increase prostat to 60 ml bid and increase TF to goal rate of 45 ml/h (1080 ml per day) to provide 1480 kcals, 155 gm protein, 903 ml free water daily.  NUTRITION DIAGNOSIS:  Inadequate oral intake related to inability to eat as evidenced by NPO status.  GOAL:  Provide needs based on ASPEN/SCCM guidelines  MONITOR:  Labs, Vent status, Weight trends, TF tolerance, I & O's  REASON FOR ASSESSMENT:  Ventilator    ASSESSMENT:  54 y/o male PMHx DM, CAD, Bell's Palsy who presented to hospital for abdominal pain and nausea. Found to have pancreatitis and be in DKA. Endorses c/n/v and poor PO intake. 11/23 code blue called. CPR x2 min with cpr rosc after 2 min. Intubated on 11/24 due to worsening respiratory status. Extubated 11/25. Reintubated 11/26. Cortrak placed 11/26  Received verbal order from MD to place cortrak, postpyloric. Cortrak placed and  xray ordered.   Spoke with Wife. She reports that pt started having symptoms and was admitted Tuesday. During these 3 days he had relatively little PO intake. He would try to eat yogurt/soup. Had n/v. Has been NPO and CL since admit. Has had almost no nutrition x 7 days.   She reports pts normal weight 220 lbs. He apparently lost 120 lbs by [facility]. This got him off of insulin and as a result he stopped checking his blood sugars. Wife/pt do watch what they eat at home. They avoid high fat/salt foods etc.   NFPE: WDL  Diet Order:  Diet NPO time specified  Skin:  Ecchymotic, rash legs  Last BM:  11/26-incontinent  Height:  Ht Readings from Last 1 Encounters:  12/15/14 5\' 10"  (1.778 m)   Weight:  Wt Readings from Last 1 Encounters:  12/18/14 267 lb 13.7 oz (121.5 kg)  Admit weight: 230 lbs (104.54  kg)  Patient is currently intubated on ventilator support MV: 11.1 L/min Temp (24hrs), Avg:98.3 F (36.8 C), Min:96.3 F (35.7 C), Max:99.7 F (37.6 C)  Propofol: None  Ideal Body Weight:  75.45 kg  BMI:  Body mass index using admit weight is 33.1 kg/(m^2).  Estimated Nutritional Needs:  Kcal:  [id number] Protein:  151 g pro (2 g/kg IBW) Fluid:  Per MD  EDUCATION NEEDS:  No education needs identified at this time  Christophe Louisathan Braydn Carneiro RD, LDN Nutrition Pager: [phone] 12/18/2014 5:17 PM

## 2014-12-18 NOTE — Progress Notes (Signed)
Patient with 35 beat run vtach patient asymptomatic alert and comfortable Dr Arsenio LoaderSommer informed in [place] orders received

## 2014-12-18 NOTE — Progress Notes (Signed)
Patient lethargic arrouses to painful stimuli nonverbal except grunts moves all extremeties x4 PERL .Dilaudid infusion stopped and Dr Tyson AliasFeinstein notified

## 2014-12-18 NOTE — Progress Notes (Signed)
ANTICOAGULATION CONSULT NOTE - Initial Consult  Pharmacy Consult for heparin Indication: chest pain/ACS  Allergies  Allergen Reactions  . Sulfa Antibiotics Other (See Comments)    Asthma attack    Patient Measurements: Height: 5\' 10"  (177.8 cm) Weight: 267 lb 13.7 oz (121.5 kg) IBW/kg (Calculated) : 73 Heparin Dosing Weight: 100 kg  Vital Signs: Temp: 99.9 F (37.7 C) (11/26 2100) Temp Source: Core (Comment) (11/26 2000) BP: 97/60 mmHg (11/26 2100) Pulse Rate: 93 (11/26 2100)  Labs:  Recent Labs  12/16/14 0140 12/16/14 0609  12/17/14 0750  12/18/14 0830 12/18/14 1514 12/18/14 1817 12/18/14 2015  HGB 14.6 14.0  --  11.4*  --   --   --   --   --   HCT 40.7 38.8*  --  31.5*  --   --   --   --   --   PLT 72* 79*  --  72*  --   --   --   --   --   CREATININE 3.78* 4.05*  < > 4.88*  < > 5.11* 4.97*  --  4.91*  TROPONINI  --   --   --   --   --   --   --  4.35* 4.41*  < > = values in this interval not displayed.  Estimated Creatinine Clearance: 22.5 mL/min (by C-G formula based on Cr of 4.91).   Medical History: Past Medical History  Diagnosis Date  . Diabetes mellitus without complication (HCC)   . Coronary artery disease   . Asthma   . Bell's palsy     Medications:  Scheduled:  . antiseptic oral rinse  7 mL Mouth Rinse QID  . aspirin  325 mg Oral Daily  . calcium gluconate  2 g Intravenous Once  . chlorhexidine gluconate  15 mL Mouth Rinse BID  . furosemide  80 mg Intravenous Q12H  . hydrocortisone sodium succinate  50 mg Intravenous Q6H  . imipenem-cilastatin  250 mg Intravenous 4 times per day  . Influenza vac split quadrivalent PF  0.5 mL Intramuscular Tomorrow-1000  . insulin aspart  0-20 Units Subcutaneous 6 times per day  . metoprolol tartrate  12.5 mg Oral BID  . pantoprazole (PROTONIX) IV  40 mg Intravenous Q24H  . vancomycin  1,500 mg Intravenous Q48H   Infusions:  . sodium chloride 10 mL/hr (12/18/14 1100)  . insulin (NOVOLIN-R) infusion  Stopped (12/17/14 1109)    Assessment: 54 yo male with ACS will be switched from SQ heparin to IV heparin.  Last SQ heparin dose was 1400 today.  Hgb 11.4 and Plt 72 K on 12/17/14  Goal of Therapy:  Heparin level 0.3-0.7 units/ml Monitor platelets by anticoagulation protocol: Yes   Plan:  - d/c SQ heparin - Start heparin drip at 1300 units/hr. No bolus. - 8hr heparin level - daily heparin level and CBC  Kayode Petion, Tsz-Yin 12/18/2014,9:25 PM

## 2014-12-18 NOTE — Progress Notes (Signed)
[facility] Physician-Brief Progress Note Patient Name: Brent ShamesDaniel J Pompei DOB: 05-Dec-1960 MRN: [id number]   Date of Service  12/18/2014  HPI/Events of Note  12 Lead EKG reveals Sinus Tachycardia with HR = 106, low voltage and anterior lateral infarct (age indeterminate). Findings c/w NSTEMI. Already on Metoprolol PO and ASA.  eICU Interventions  Will order: 1. Consult Cardiology.  2. Consider Heparin IV infusion.      Intervention Category Major Interventions: Other: Intermediate Interventions: Diagnostic test evaluation  Sommer,Steven Eugene 12/18/2014, 7:45 PM

## 2014-12-18 NOTE — Progress Notes (Signed)
eLink Physician-Brief Progress Note Patient Name: Brent ShamesDaniel J Ceballos DOB: 09-May-1960 MRN: [id number]   Date of Service  12/18/2014  HPI/Events of Note  K+ = 3.0 and Creatinine = 4.97.   eICU Interventions  Will replete K+ cautiously (20 meq IV over 2 hours).     Intervention Category Intermediate Interventions: Electrolyte abnormality - evaluation and management  Lenell AntuSommer,Arieh Bogue Eugene 12/18/2014, 4:43 PM

## 2014-12-18 NOTE — Progress Notes (Signed)
Patient intubated per Dr Tyson AliasFeinstein with RT at bedside.OG placed and NG dc'd per orders. I called paients wife Rosey Batheresa and informed her of need for intubation and Dr Tyson AliasFeinstein also talked with her via phone

## 2014-12-18 NOTE — Progress Notes (Signed)
eLink Physician-Brief Progress Note Patient Name: Leotis ShamesDaniel J Wyche DOB: 21-Jun-1960 MRN: [id number]   Date of Service  12/18/2014  HPI/Events of Note  Patient had 35 beat run of NSVT. Now NSR with rate = 106. K+ = 3.0 earlier >> currently being repleted.   eICU Interventions  Will order: 1. Cycle Troponins. 2. BMP and Mg++ level at 8 PM (one hour post last  K+ run).     Intervention Category Major Interventions: Arrhythmia - evaluation and management  Elijahjames Fuelling Eugene 12/18/2014, 5:52 PM

## 2014-12-18 NOTE — Procedures (Signed)
Intubation Procedure Note Brent Rasmussen [id number] 1960-04-10  Procedure: Intubation Indications: Respiratory insufficiency  Procedure Details Consent: Unable to obtain consent because of emergent medical necessity. Time Out: Verified patient identification, verified procedure, site/side was marked, verified correct patient position, special equipment/implants available, medications/allergies/relevent history reviewed, required imaging and test results available.  Performed  Maximum sterile technique was used including gloves, gown, hand hygiene and mask.  MAC and 3    Evaluation Hemodynamic Status: BP stable throughout; O2 sats: stable throughout Patient's Current Condition: stable Complications: No apparent complications Patient did tolerate procedure well. Chest X-ray ordered to verify placement.  CXR: pending.   Brent Rasmussen,Brent Rasmussen. 12/18/2014  Guppy breathing, poor neuro,acidotic Glide 3  Brent Rasmussen. Brent AliasFeinstein, MD, FACP Pgr: [phone] [facility]

## 2014-12-18 NOTE — Progress Notes (Signed)
RT note: vent changes per MD verbal order per ABG results.

## 2014-12-18 NOTE — Progress Notes (Signed)
eLink Physician-Brief Progress Note Patient Name: Brent Rasmussen DOB: 1960/12/30 MRN: [id number]   Date of Service  12/18/2014  HPI/Events of Note  Troponin #1 = 4.35.  eICU Interventions  Will order: 1. ASA 325 mg PO now and Q day. 2. Metoprolol 12.5 mg PO now and BID. 3.EKG now.      Intervention Category Major Interventions: Arrhythmia - evaluation and management  [name] Dennard Nipugene 12/18/2014, 7:43 PM

## 2014-12-18 NOTE — Progress Notes (Signed)
Abnormal labs to Dr Arsenio LoaderSommer K-3.0 Ca 5.0

## 2014-12-18 NOTE — Progress Notes (Signed)
CRITICAL VALUE ALERT  Critical value received:  Ca 4.7  Date of notification:  11/26  Time of notification:  0921  Critical value read back:Yes.    Nurse who received alert:  Acey LavPhyllis Jonet Mathies  MD notified (1st page):  Dr Tyson AliasFeinstein  Time of first page:  1045  MD notified (2nd page):  Time of second page:  Responding MD:  Dr Tyson AliasFeinstein  Time MD responded:  1045 at bedside

## 2014-12-19 DIAGNOSIS — I214 Non-ST elevation (NSTEMI) myocardial infarction: Secondary | ICD-10-CM

## 2014-12-19 DIAGNOSIS — I472 Ventricular tachycardia: Secondary | ICD-10-CM

## 2014-12-19 DIAGNOSIS — Z4659 Encounter for fitting and adjustment of other gastrointestinal appliance and device: Secondary | ICD-10-CM

## 2014-12-19 LAB — CBC
HEMATOCRIT: 31.4 % — AB (ref 39.0–52.0)
Hemoglobin: 11 g/dL — ABNORMAL LOW (ref 13.0–17.0)
MCH: 29.3 pg (ref 26.0–34.0)
MCHC: 35 g/dL (ref 30.0–36.0)
MCV: 83.5 fL (ref 78.0–100.0)
PLATELETS: 91 10*3/uL — AB (ref 150–400)
RBC: 3.76 MIL/uL — ABNORMAL LOW (ref 4.22–5.81)
RDW: 15.6 % — AB (ref 11.5–15.5)
WBC: 8.1 10*3/uL (ref 4.0–10.5)

## 2014-12-19 LAB — BASIC METABOLIC PANEL
ANION GAP: 17 — AB (ref 5–15)
ANION GAP: 14 (ref 5–15)
Anion gap: 19 — ABNORMAL HIGH (ref 5–15)
BUN: 76 mg/dL — ABNORMAL HIGH (ref 6–20)
BUN: 76 mg/dL — ABNORMAL HIGH (ref 6–20)
BUN: 77 mg/dL — ABNORMAL HIGH (ref 6–20)
CALCIUM: 5.9 mg/dL — AB (ref 8.9–10.3)
CHLORIDE: 101 mmol/L (ref 101–111)
CHLORIDE: 108 mmol/L (ref 101–111)
CO2: 20 mmol/L — AB (ref 22–32)
CO2: 21 mmol/L — AB (ref 22–32)
CO2: 23 mmol/L (ref 22–32)
CREATININE: 4.65 mg/dL — AB (ref 0.61–1.24)
CREATININE: 4.33 mg/dL — AB (ref 0.61–1.24)
Calcium: 5.4 mg/dL — CL (ref 8.9–10.3)
Calcium: 6.3 mg/dL — CL (ref 8.9–10.3)
Chloride: 104 mmol/L (ref 101–111)
Creatinine, Ser: 4.08 mg/dL — ABNORMAL HIGH (ref 0.61–1.24)
GFR calc non Af Amer: 13 mL/min — ABNORMAL LOW (ref >60)
GFR calc non Af Amer: 15 mL/min — ABNORMAL LOW (ref >60)
GFR, EST AFRICAN AMERICAN: 15 mL/min — AB (ref >60)
GFR, EST AFRICAN AMERICAN: 16 mL/min — AB (ref >60)
GFR, EST AFRICAN AMERICAN: 18 mL/min — AB (ref >60)
GFR, EST NON AFRICAN AMERICAN: 14 mL/min — AB (ref >60)
GLUCOSE: 287 mg/dL — AB (ref 65–99)
Glucose, Bld: 254 mg/dL — ABNORMAL HIGH (ref 65–99)
Glucose, Bld: 267 mg/dL — ABNORMAL HIGH (ref 65–99)
POTASSIUM: 2.8 mmol/L — AB (ref 3.5–5.1)
POTASSIUM: 3.1 mmol/L — AB (ref 3.5–5.1)
Potassium: 2.8 mmol/L — ABNORMAL LOW (ref 3.5–5.1)
Sodium: 140 mmol/L (ref 135–145)
Sodium: 142 mmol/L (ref 135–145)
Sodium: 145 mmol/L (ref 135–145)

## 2014-12-19 LAB — PTH, INTACT AND CALCIUM
CALCIUM TOTAL (PTH): 4.8 mg/dL — AB (ref 8.7–10.2)
PTH: 87 pg/mL — AB (ref 15–65)

## 2014-12-19 LAB — GLUCOSE, CAPILLARY
GLUCOSE-CAPILLARY: 198 mg/dL — AB (ref 65–99)
GLUCOSE-CAPILLARY: 224 mg/dL — AB (ref 65–99)
Glucose-Capillary: 201 mg/dL — ABNORMAL HIGH (ref 65–99)
Glucose-Capillary: 220 mg/dL — ABNORMAL HIGH (ref 65–99)
Glucose-Capillary: 246 mg/dL — ABNORMAL HIGH (ref 65–99)
Glucose-Capillary: 256 mg/dL — ABNORMAL HIGH (ref 65–99)

## 2014-12-19 LAB — CULTURE, BLOOD (ROUTINE X 2)
CULTURE: NO GROWTH
Culture: NO GROWTH

## 2014-12-19 LAB — PHOSPHORUS
Phosphorus: 5.6 mg/dL — ABNORMAL HIGH (ref 2.5–4.6)
Phosphorus: 5.5 mg/dL — ABNORMAL HIGH (ref 2.5–4.6)
Phosphorus: 5.1 mg/dL — ABNORMAL HIGH (ref 2.5–4.6)

## 2014-12-19 LAB — MAGNESIUM
MAGNESIUM: 2 mg/dL (ref 1.7–2.4)
Magnesium: 1.8 mg/dL (ref 1.7–2.4)
Magnesium: 2 mg/dL (ref 1.7–2.4)

## 2014-12-19 LAB — HEPARIN LEVEL (UNFRACTIONATED): HEPARIN UNFRACTIONATED: 0.15 [IU]/mL — AB (ref 0.30–0.70)

## 2014-12-19 LAB — TROPONIN I: TROPONIN I: 4.62 ng/mL — AB (ref <0.031)

## 2014-12-19 MED ORDER — HEPARIN SODIUM (PORCINE) 5000 UNIT/ML IJ SOLN
5000.0000 [IU] | Freq: Three times a day (TID) | INTRAMUSCULAR | Status: DC
  Administered 2014-12-19 – 2015-01-04 (×46): 5000 [IU] via SUBCUTANEOUS
  Filled 2014-12-19 (×50): qty 1

## 2014-12-19 MED ORDER — VITAL HIGH PROTEIN PO LIQD
1000.0000 mL | ORAL | Status: DC
  Filled 2014-12-19 (×3): qty 1000

## 2014-12-19 MED ORDER — FUROSEMIDE 10 MG/ML IJ SOLN
40.0000 mg | Freq: Two times a day (BID) | INTRAMUSCULAR | Status: DC
  Administered 2014-12-20 (×2): 40 mg via INTRAVENOUS
  Filled 2014-12-19 (×3): qty 4

## 2014-12-19 MED ORDER — POTASSIUM CHLORIDE 20 MEQ/15ML (10%) PO SOLN
40.0000 meq | Freq: Once | ORAL | Status: AC
  Administered 2014-12-20: 40 meq
  Filled 2014-12-19: qty 30

## 2014-12-19 MED ORDER — HEPARIN BOLUS VIA INFUSION
2000.0000 [IU] | Freq: Once | INTRAVENOUS | Status: AC
  Administered 2014-12-19: 2000 [IU] via INTRAVENOUS
  Filled 2014-12-19: qty 2000

## 2014-12-19 MED ORDER — POTASSIUM CHLORIDE 10 MEQ/50ML IV SOLN
10.0000 meq | INTRAVENOUS | Status: AC
  Administered 2014-12-19 – 2014-12-20 (×4): 10 meq via INTRAVENOUS
  Filled 2014-12-19 (×4): qty 50

## 2014-12-19 MED ORDER — VITAL HIGH PROTEIN PO LIQD: 1000.0000 mL | ORAL | Status: DC

## 2014-12-19 MED ORDER — POTASSIUM CHLORIDE 10 MEQ/50ML IV SOLN
10.0000 meq | INTRAVENOUS | Status: AC
  Administered 2014-12-19 (×4): 10 meq via INTRAVENOUS
  Filled 2014-12-19 (×4): qty 50

## 2014-12-19 MED ORDER — ATORVASTATIN CALCIUM 40 MG PO TABS
40.0000 mg | ORAL_TABLET | Freq: Every day | ORAL | Status: DC
  Administered 2014-12-19 – 2015-01-03 (×16): 40 mg via ORAL
  Filled 2014-12-19 (×17): qty 1

## 2014-12-19 MED ORDER — CALCIUM GLUCONATE 10 % IV SOLN
2.0000 g | Freq: Once | INTRAVENOUS | Status: AC
  Administered 2014-12-19: 2 g via INTRAVENOUS
  Filled 2014-12-19: qty 20

## 2014-12-19 MED ORDER — PRO-STAT SUGAR FREE PO LIQD
30.0000 mL | Freq: Two times a day (BID) | ORAL | Status: DC
  Administered 2014-12-19 – 2014-12-20 (×3): 30 mL
  Filled 2014-12-19 (×4): qty 30

## 2014-12-19 MED ORDER — SODIUM CHLORIDE 0.9 % IV SOLN
2.0000 g | Freq: Once | INTRAVENOUS | Status: AC
  Administered 2014-12-19: 2 g via INTRAVENOUS
  Filled 2014-12-19: qty 20

## 2014-12-19 MED ORDER — MAGNESIUM SULFATE IN D5W 10-5 MG/ML-% IV SOLN
1.0000 g | Freq: Once | INTRAVENOUS | Status: AC
  Administered 2014-12-19: 1 g via INTRAVENOUS
  Filled 2014-12-19: qty 100

## 2014-12-19 MED ORDER — HYDROCORTISONE NA SUCCINATE PF 100 MG IJ SOLR
25.0000 mg | Freq: Four times a day (QID) | INTRAMUSCULAR | Status: DC
  Administered 2014-12-19 – 2014-12-20 (×3): 25 mg via INTRAVENOUS
  Filled 2014-12-19 (×4): qty 0.5

## 2014-12-19 NOTE — Progress Notes (Signed)
PULMONARY / CRITICAL CARE MEDICINE   Name:Brent Rasmussen JOI:[id number] DOB:10/17/1960   ADMISSION DATE: 12/14/2014 CONSULTATION DATE: 12/14/14  REFERRING MD : [facility]  CHIEF COMPLAINT: DKA, pancreatitis  INITIAL PRESENTATION: Brent Rasmussen is a 54 y.o. male that presented to [facility] for abdominal pain. He was found to have pancreatitis and be in DKA. There he was provided 3.5 L bolus. He was started on an insulin drip and was transferred to [facility] for care.   STUDIES:  11/22 MRI brain: No acute processes 11/22 CT abdomen: Moderate to severe acute pancreatitis without fluid collection, abscess or pseudocyst. 11/25 repeat CT >>>worsening diffuse pancreatitis, no abscess,, cysts  SIGNIFICANT EVENTS: 11/22 -Transferred to [facility] from [facility] 11/23-BM vasal vagals, cpr 2 min , ETT placed, on presors 11/24- aline placed, major improved pressors, volume given 11/25- extubated, escallating abdo pain 11/26- resp failure, re intubated 11/26- concern NSTEMI, heparin started 11/27- neg 6.4 liters  SUBJECTIVE:  Awake, no cp   VITAL SIGNS: Temp: [94 F (34.4 C)-99.8 F (37.7 C)] 97.1 F (36.2 C) (11/23 0416) Pulse Rate: [114-135] 126 (11/23 0430) Resp: [20-33] 30 (11/23 0430) BP: (62-145)/(49-129) 95/74 mmHg (11/23 0430) SpO2: [96 %-100 %] 98 % (11/23 0430) Weight: [230 lb (104.327 kg)-231 lb 4.2 oz (104.9 kg)] 231 lb 4.2 oz (104.9 kg) (11/23 0415) HEMODYNAMICS:   VENTILATOR SETTINGS:   INTAKE / OUTPUT:  Intake/Output Summary (Last 24 hours) at 12/15/14 0452 Last data filed at 12/15/14 0400  Gross per 24 hour  Intake 3711.57 ml  Output  50 ml  Net 3661.57 ml    PHYSICAL EXAMINATION: General: rass 0, fc Neuro: Follows commands, no focal deficits, awake HEENT:line clean Cardiovascular: RRR, no murmurs Lungs: cta Abdomen: distended hypoactive +BS, no r/g, soft, abdo pain diffuse  mild Musculoskeletal: Moves all extremities, nonpitting edema Skin: Several healing excoriations on anterior shins bilaterally unchanged  LABS:  CBC  Last Labs      Recent Labs Lab 12/14/14 1810 12/14/14 2351 12/15/14 0345  WBC 15.4* 14.6* 11.3*  HGB 17.7* 17.8* 16.3  HCT 50.5 48.9 45.8  PLT 262 198 133*     Coag's  Last Labs      Recent Labs Lab 12/14/14 1810  INR 1.02     BMET  Last Labs      Recent Labs Lab 12/14/14 1810 12/14/14 2357  NA 128* 133*  K 4.2 4.0  CL 93* 104  CO2 6* 6*  BUN 37* 38*  CREATININE 1.87* 2.39*  GLUCOSE 485* 426*     Electrolytes  Last Labs      Recent Labs Lab 12/14/14 1810 12/14/14 2357  CALCIUM 9.8 8.4*     Sepsis Markers  Last Labs      Recent Labs Lab 12/14/14 1826 12/14/14 2023  LATICACIDVEN 6.59* 2.36*     ABG  Last Labs      Recent Labs Lab 12/15/14 0354  PHART 7.326*  PCO2ART 20.8*  PO2ART 101*     Liver Enzymes  Last Labs      Recent Labs Lab 12/14/14 1810  AST 32  ALT 22  ALKPHOS 73  BILITOT 1.9*  ALBUMIN 4.4     Cardiac Enzymes  Last Labs      Recent Labs Lab 12/14/14 1810  TROPONINI <0.03     Glucose  Last Labs      Recent Labs Lab 12/14/14 1817 12/14/14 2009 12/14/14 2133 12/15/14 0415  GLUCAP 490* 380* 395*  262*      Imaging Ct Abdomen Pelvis Wo Contrast  12/14/2014 CLINICAL DATA: Altered mental status, reason UTI, hypothermia, diabetes, elevated lactate EXAM: CT ABDOMEN AND PELVIS WITHOUT CONTRAST TECHNIQUE: Multidetector CT imaging of the abdomen and pelvis was performed following the standard protocol without IV contrast. COMPARISON: None. FINDINGS: Lower chest: No acute findings. Hepatobiliary: No mass visualized on this un-enhanced exam. Pancreas: Diffuse peripancreatic stranding edema evident which also extends along the retroperitoneum, duodenum, and  transverse mesocolon. Findings are compatible with moderate to severe acute pancreatitis. No associated fluid collection, pseudocyst or abscess. No ductal dilatation. Spleen: Within normal limits in size. Adrenals/Urinary Tract: Normal adrenal glands. Minor nonspecific perinephric strandy edema without obstructing urinary tract or ureteral calculus. Stomach/Bowel: Slight thickening of the duodenum adjacent to the pancreas, suspect mild secondary duodenitis. No associated obstruction, ileus, or free air. Vascular/Lymphatic: No adenopathy. Negative for aneurysm. Minor atherosclerosis of the bifurcation and iliac vessels. Reproductive: No mass or other significant abnormality. Other: No inguinal abnormality all are hernia. Musculoskeletal: Minor degenerative changes of the lumbar spine. IMPRESSION: Moderate to severe acute pancreatitis without fluid collection, abscess or pseudocyst. Mild wall thickening of the adjacent duodenum, suspect reactive duodenitis secondary to the adjacent inflammatory process. No associated bowel obstruction or free air Electronically Signed By: Jerilynn Mages. Shick M.D. On: 12/14/2014 19:36   Ct Head Wo Contrast  12/14/2014 CLINICAL DATA: Patient with altered mental status. Recent diagnosis urinary tract infection. EXAM: CT HEAD WITHOUT CONTRAST TECHNIQUE: Contiguous axial images were obtained from the base of the skull through the vertex without intravenous contrast. COMPARISON: MRI brain 01/25/2009. FINDINGS: Ventricles and sulci are appropriate for patient's age. No evidence for acute cortically based infarct, intracranial hemorrhage, mass lesion mass-effect. Orbits are unremarkable. Paranasal sinuses are well aerated. Mastoid air cells are unremarkable. Calvarium is intact. IMPRESSION: No acute intracranial process. Electronically Signed By: Lovey Newcomer M.D. On: 12/14/2014 19:30   Dg Chest Port 1 View  12/14/2014 CLINICAL DATA: Altered Mental status, Elevated Blood/sugar/  Fever. HX: diabetes, CAD, Asthma, Cardiac Catherization EXAM: PORTABLE CHEST - 1 VIEW COMPARISON: 01/08/2010 FINDINGS: Low lung volumes. No focal infiltrate or overt edema. Heart size normal. No pneumothorax. No effusion. Visualized skeletal structures are unremarkable. IMPRESSION: Low lung volumes. No acute cardiopulmonary disease. Electronically Signed By: Lucrezia Europe M.D. On: 12/14/2014 19:31   ASSESSMENT / PLAN:  PULMONARY Acute resp failure ATX R/o ards pulm edema P:  Neg balance SBT, unlikley to extubate untilabdo distention improves pcxr in am  Keep same mV  CARDIOVASCULAR Shock, SIRS, initially Hypovolemic, NOW hypervolemic, trop elevation, likley NSTEMI P:  MAP goals met on own Tele Echo See heme asa  RENAL AKI in the setting of DKA/ dehydration, ATN, excellent output with lasix and reduction crt ALL NONAG remains hypoKalemia R/o RTA 4 P:  kvo Lasix, reduce bmet in am  k supp bmet in afternoon  GASTROINTESTINAL Severe pancreatitis, worsening edema on CT, at risk necrosis, at risk hemorrhagic conversion P:  Post pyloric was goal then feed ppi  HEMATOLOGIC dvt prevention, thrombocytopenia ( sirs), HIgh risk hemorrhagic conversion pancrease P:  Repeat CBC in am Sub q heparin , restart i feel strongly that the risk of heparin and hemorrhagic conversion outwt the benefit, NO st changes, old Q waves trop in setting renal failure - dc systemic heparin  INFECTIOUS Acute pancreatitis but no evidence of perforation, abscess or pseudocyst on CT  Shock 11/24 P:  BCx2 pending  ABX 11/24 Imipenem>>> Pct downward No fevers, add stop date abx 7  days  Ensure vanc is also dc   ENDOCRINE H/o diabetes type 2 DKA resolved, NO AG present - resolved P:  ssi res  NEUROLOGIC Metabolic encephalopathy again, pain P:  fent wua  FAMILY  - Updates: I updated wife 11/25, 11/26  - Inter-disciplinary family meet or Palliative Care meeting due  by: 12/22/14  Ccm time 30 min   Lavon Paganini. Titus Mould, MD, [place] Pgr: [facility] 12/19/2014 8:35 AM

## 2014-12-19 NOTE — Progress Notes (Signed)
Brief Nutrition Note  Consult received for enteral/tube feeding initiation and management.  Adult Enteral Nutrition Protocol already initiated. RD ordered 23M PEPUP protocol. RD to follow up 11/28.  Admitting Dx: Acute pancreatitis, unspecified pancreatitis type [K85.9]  Body mass index is 35.68 kg/(m^2). Pt meets criteria for obesity based on current BMI.  Labs:   Recent Labs Lab 12/16/14 0140  12/18/14 0830 12/18/14 1514 12/18/14 2015  NA 130*  < > 133* 136 138  K 3.3*  < > 3.6 3.0* 3.0*  CL 105  < > 101 102 103  CO2 14*  < > 18* 18* 19*  BUN 60*  < > 80* 81* 78*  CREATININE 3.78*  < > 5.11* 4.97* 4.91*  CALCIUM 5.8*  < > 4.7* 5.0* 5.1*  MG 1.9  --   --   --  1.7  PHOS 2.4*  --   --   --   --   GLUCOSE 190*  < > 218* 202* 201*  < > = values in this interval not displayed.  Brent FrancoLindsey Salome Hautala, MS, RD, LDN Pager: [phone] After Hours Pager: [phone]

## 2014-12-19 NOTE — Progress Notes (Signed)
eLink Physician-Brief Progress Note Patient Name: Brent Rasmussen DOB: 09-27-1960 MRN: [id number]   Date of Service  12/19/2014  HPI/Events of Note  -on lasix, persistent low K  eICU Interventions  Hypokalemia -repleted  Hypocalcemia- chk ion Ca in am     Intervention Category Intermediate Interventions: Electrolyte abnormality - evaluation and management  Maribel Hadley V. 12/19/2014, 11:46 PM

## 2014-12-19 NOTE — Progress Notes (Signed)
eLink Physician-Brief Progress Note Patient Name: Brent ShamesDaniel J Rasmussen DOB: Feb 27, 1960 MRN: [id number]   Date of Service  12/19/2014  HPI/Events of Note  Multiple issues: 1. Ca++ = 5.9 and albumin = 1.6. Ca++ corrects to 7.82 and 2. K+ = 2.8 and Creatinine = 4.33.  eICU Interventions  Will replete Ca++ and K+.     Intervention Category Major Interventions: Electrolyte abnormality - evaluation and management  Shay Bartoli Eugene 12/19/2014, 5:29 PM

## 2014-12-19 NOTE — Progress Notes (Signed)
CRITICAL VALUE ALERT  Critical value received:  Calcium 6.3  Date of notification:  12/19/14  Time of notification:  2338  Critical value read back:Yes.    Nurse who received alert:  Remonia RichterLindsey Walsh RN  MD notified (1st page):  eLink MD  Time of first page:  2340  Responding MD:  Vassie LollAlva, MD  Time MD responded:  [phone]

## 2014-12-19 NOTE — Progress Notes (Signed)
CRITICAL VALUE ALERT  Critical value received:  Calcium 5.4  Date of notification:  12/19/14  Time of notification:  0911  Critical value read back:Yes.    Nurse who received alert:  Layne BentonJulian Natalyia Innes, RN  MD notified (1st page):  Dr. Tyson AliasFeinstein  Time of first page:  0911  MD notified (2nd page):  Time of second page:  Responding MD:  Dr. Tyson AliasFeinstein  Time MD responded:  [phone]

## 2014-12-19 NOTE — Progress Notes (Signed)
ANTICOAGULATION CONSULT NOTE Pharmacy Consult for heparin Indication: chest pain/ACS  Allergies  Allergen Reactions  . Sulfa Antibiotics Other (See Comments)    Asthma attack    Patient Measurements: Height: 5\' 10"  (177.8 cm) Weight: 248 lb 10.9 oz (112.8 kg) IBW/kg (Calculated) : 73 Heparin Dosing Weight: 100 kg  Vital Signs: Temp: 98.6 F (37 C) (11/27 0600) Temp Source: Core (Comment) (11/27 0400) BP: 99/62 mmHg (11/27 0600) Pulse Rate: 91 (11/27 0600)  Labs:  Recent Labs  12/17/14 0750  12/18/14 0830 12/18/14 1514 12/18/14 1817 12/18/14 2015 12/19/14 0545  HGB 11.4*  --   --   --   --   --  11.0*  HCT 31.5*  --   --   --   --   --  31.4*  PLT 72*  --   --   --   --   --  91*  HEPARINUNFRC  --   --   --   --   --   --  0.15*  CREATININE 4.88*  < > 5.11* 4.97*  --  4.91*  --   TROPONINI  --   --   --   --  4.35* 4.41*  --   < > = values in this interval not displayed.  Estimated Creatinine Clearance: 21.6 mL/min (by C-G formula based on Cr of 4.91).  Assessment: 54 yo male with elevated cardiac markers/NSTEMI for heparin Goal of Therapy:  Heparin level 0.3-0.7 units/ml Monitor platelets by anticoagulation protocol: Yes   Plan:  Heparin 2000 units IV bolus, then increase heparin 1650 units/hr Check heparin level in 8 hours.  Brent Rasmussen, Brent Rasmussen 12/19/2014,6:24 AM

## 2014-12-19 NOTE — Progress Notes (Signed)
CRITICAL VALUE ALERT  Critical value received:  Calcium 5.9, Potassium 2.8  Date of notification:  12/19/2014   Time of notification:  5:26 PM   Critical value read back:Yes.    Nurse who received alert:  Layne BentonJulian Lenisha Lacap, RN  MD notified (1st page):  Dr. Arsenio LoaderSommer  Time of first page:  5:27 PM   MD notified (2nd page):  Time of second page:  Responding MD:  Dr. Arsenio LoaderSommer  Time MD responded:  5:27 PM

## 2014-12-19 NOTE — Progress Notes (Signed)
ANTIBIOTIC CONSULT NOTE - FOLLOW UP  Pharmacy Consult for Imipenem Indication: pancreatitis  Allergies  Allergen Reactions  . Sulfa Antibiotics Other (See Comments)    Asthma attack    Patient Measurements: Height: 5\' 10"  (177.8 cm) Weight: 248 lb 10.9 oz (112.8 kg) IBW/kg (Calculated) : 73  Vital Signs: Temp: 97.5 F (36.4 C) (11/27 0900) Temp Source: Core (Comment) (11/27 0400) BP: 159/72 mmHg (11/27 0942) Pulse Rate: 96 (11/27 0900) Intake/Output from previous day: 11/26 0701 - 11/27 0700 In: 1756.5 [I.V.:336.5; NG/GT:180; IV Piggyback:1240] Out: 8156 [Urine:7955; Emesis/NG output:200; Stool:1] Intake/Output from this shift: Total I/O In: 140.6 [I.V.:140.6] Out: 375 [Urine:375]  Labs:  Recent Labs  12/17/14 0750  12/18/14 1514 12/18/14 2015 12/19/14 0545 12/19/14 0845  WBC 8.6  --   --   --  8.1  --   HGB 11.4*  --   --   --  11.0*  --   PLT 72*  --   --   --  91*  --   CREATININE 4.88*  < > 4.97* 4.91*  --  4.65*  < > = values in this interval not displayed. Estimated Creatinine Clearance: 22.8 mL/min (by C-G formula based on Cr of 4.65). No results for input(s): VANCOTROUGH, VANCOPEAK, VANCORANDOM, GENTTROUGH, GENTPEAK, GENTRANDOM, TOBRATROUGH, TOBRAPEAK, TOBRARND, AMIKACINPEAK, AMIKACINTROU, AMIKACIN in the last 72 hours.   Microbiology: Recent Results (from the past 720 hour(s))  Urine culture     Status: None   Collection Time: 12/14/14  6:20 PM  Result Value Ref Range Status   Specimen Description URINE, CATHETERIZED  Final   Special Requests NONE  Final   Culture   Final    NO GROWTH 1 DAY [facility]    Report Status 12/15/2014 FINAL  Final  Blood Culture (routine x 2)     Status: None   Collection Time: 12/14/14  6:34 PM  Result Value Ref Range Status   Specimen Description BLOOD LEFT WRIST  Final   Special Requests BOTTLES DRAWN AEROBIC AND ANAEROBIC 5 CC EACH  Final   Culture   Final    NO GROWTH 5 DAYS [facility]    Report Status 12/19/2014 FINAL  Final  Blood Culture (routine x 2)     Status: None   Collection Time: 12/14/14  6:40 PM  Result Value Ref Range Status   Specimen Description BLOOD RIGHT ANTECUBITAL  Final   Special Requests BOTTLES DRAWN AEROBIC AND ANAEROBIC 5CC  Final   Culture   Final    NO GROWTH 5 DAYS [facility]    Report Status 12/19/2014 FINAL  Final  MRSA PCR Screening     Status: None   Collection Time: 12/15/14  9:07 PM  Result Value Ref Range Status   MRSA by PCR NEGATIVE NEGATIVE Final    Comment:        The GeneXpert MRSA Assay (FDA approved for NASAL specimens only), is one component of a comprehensive MRSA colonization surveillance program. It is not intended to diagnose MRSA infection nor to guide or monitor treatment for MRSA infections.     Anti-infectives    Start     Dose/Rate Route Frequency Ordered Stop   12/18/14 1400  vancomycin (VANCOCIN) 1,500 mg in sodium chloride 0.9 % 500 mL IVPB  Status:  Discontinued     1,500 mg 250 mL/hr over 120 Minutes Intravenous Every 48 hours 12/16/14 1347 12/19/14 0857   12/16/14 1345  vancomycin (VANCOCIN) 2,000 mg in sodium chloride 0.9 % 500 mL IVPB     2,000 mg 250 mL/hr over 120 Minutes Intravenous  Once 12/16/14 1344 12/16/14 1651   12/16/14 1200  imipenem-cilastatin (PRIMAXIN) 250 mg in sodium chloride 0.9 % 100 mL IVPB     250 mg 200 mL/hr over 30 Minutes Intravenous 4 times per day 12/16/14 1044 12/23/14 1159   12/14/14 1830  piperacillin-tazobactam (ZOSYN) IVPB 3.375 g     3.375 g 100 mL/hr over 30 Minutes Intravenous  Once 12/14/14 1829 12/14/14 1920      Assessment: 54 year old male on Day #4 of empiric imipenem for pancreatitis.  He has acute renal failure that may be improving based on an improvement in SCr and urine output (with diuretics). His imipenem regimen is appropriate for his estimated CrCl.  Plan:  Continue imipenem  IV q6h to complete  a 7 day course (stop date entered) Monitor renal function closely Follow for any available micro data  Estella Husk, Pharm.D., BCPS, AAHIVP Clinical Pharmacist Phone: [phone] or [phone] 12/19/2014, 9:52 AM

## 2014-12-19 NOTE — Progress Notes (Signed)
Patient ID: Brent Rasmussen, male   DOB: March 20, 1960, 54 y.o.   MRN: [id number]    Patient Name: Brent Rasmussen Date of Encounter: 12/19/2014     Active Problems:   DKA (diabetic ketoacidoses) (HCC)   Pancreatitis   Acute respiratory failure with hypoxia (HCC)   Pancreatitis, acute   Encounter for intubation   Encounter for orogastric (OG) tube placement    SUBJECTIVE  Awake, alert and oriented and intubated. C/o mid-epigastric pain  CURRENT MEDS . antiseptic oral rinse  7 mL Mouth Rinse QID  . aspirin  325 mg Oral Daily  . atorvastatin  40 mg Oral q1800  . calcium gluconate  2 g Intravenous Once  . chlorhexidine gluconate  15 mL Mouth Rinse BID  . feeding supplement (PRO-STAT SUGAR FREE 64)  30 mL Per Tube BID  . furosemide  40 mg Intravenous Q12H  . heparin subcutaneous  5,000 Units Subcutaneous 3 times per day  . hydrocortisone sodium succinate  50 mg Intravenous Q6H  . imipenem-cilastatin  250 mg Intravenous 4 times per day  . Influenza vac split quadrivalent PF  0.5 mL Intramuscular Tomorrow-1000  . insulin aspart  0-20 Units Subcutaneous 6 times per day  . metoprolol tartrate  12.5 mg Oral BID  . pantoprazole (PROTONIX) IV  40 mg Intravenous Q24H  . potassium chloride  10 mEq Intravenous Q1 Hr x 4    OBJECTIVE  Filed Vitals:   12/19/14 0800 12/19/14 0900 12/19/14 0942 12/19/14 1000  BP: 103/60 105/63 159/72 96/62  Pulse: 94 96  99  Temp: 97.9 F (36.6 C) 97.5 F (36.4 C)  97.2 F (36.2 C)  TempSrc:      Resp: Height:      Weight:      SpO2: 94% 94%  94%    Intake/Output Summary (Last 24 hours) at 12/19/14 1103 Last data filed at 12/19/14 1013  Gross per 24 hour  Intake 2240.35 ml  Output   8050 ml  Net -5809.65 ml   Filed Weights   12/17/14 0411 12/18/14 0345 12/19/14 0500  Weight: 260 lb 2.3 oz (118 kg) 267 lb 13.7 oz (121.5 kg) 248 lb 10.9 oz (112.8 kg)    PHYSICAL EXAM  General: awake and alert with ET tube in place. Neuro:  Alert and oriented X 3. Moves all extremities spontaneously. Psych: Normal affect. HEENT:  Normal  Neck: Supple without bruits; 7 cm JVD. Lungs:  Resp regular and unlabored, CTA. Heart: RRR no s3, s4, or murmurs. Abdomen: tender to palpation, breath sounds reduced Extremities: No clubbing, cyanosis or edema. DP/PT/Radials 2+ and equal bilaterally.  Accessory Clinical Findings  CBC  Recent Labs  12/17/14 0750 12/19/14 0545  WBC 8.6 8.1  NEUTROABS 7.5  --   HGB 11.4* 11.0*  HCT 31.5* 31.4*  MCV 81.4 83.5  PLT 72* 91*   Basic Metabolic Panel  Recent Labs  12/18/14 2015 12/19/14 0845  NA 138 140  K 3.0* 2.8*  CL 103 101  CO2 19* 20*  GLUCOSE 201* 254*  BUN 78* 76*  CREATININE 4.91* 4.65*  CALCIUM 5.1* 5.4*  MG 1.7 1.8  PHOS  --  5.6*   Liver Function Tests  Recent Labs  12/17/14 0216 12/18/14 0401  AST 133* 49*  ALT 98* 58  ALKPHOS 41 50  BILITOT 0.5 1.1  PROT 4.2* 4.6*  ALBUMIN 1.5* 1.6*    Recent Labs  12/18/14 0830  LIPASE 110*  AMYLASE 144*  Cardiac Enzymes  Recent Labs  12/18/14 1817 12/18/14 2015 12/19/14 0545  TROPONINI 4.35* 4.41* 4.62*   BNP Invalid input(s): POCBNP D-Dimer No results for input(s): DDIMER in the last 72 hours. Hemoglobin A1C No results for input(s): HGBA1C in the last 72 hours. Fasting Lipid Panel No results for input(s): CHOL, HDL, LDLCALC, TRIG, CHOLHDL, LDLDIRECT in the last 72 hours. Thyroid Function Tests No results for input(s): TSH, T4TOTAL, T3FREE, THYROIDAB in the last 72 hours.  Invalid input(s): FREET3  TELE  NSR, NSVT yesterday  Radiology/Studies  Ct Abdomen Pelvis Wo Contrast  12/18/2014  CLINICAL DATA:  Acute onset of generalized abdominal pain and distention. Respiratory stress. Initial encounter. EXAM: CT ABDOMEN AND PELVIS WITHOUT CONTRAST TECHNIQUE: Multidetector CT imaging of the abdomen and pelvis was performed following the standard protocol without IV contrast. COMPARISON:  CT of  the abdomen and pelvis performed 12/14/2014 FINDINGS: Trace right and small left pleural effusions are noted, with associated atelectasis. Scattered coronary artery calcifications are seen. New small volume ascites is seen within the abdomen and pelvis. There is diffusely worsened soft tissue edema and inflammation noted about the entirety of the pancreas. Evaluation for devascularization is limited without contrast. No pseudocyst formation is characterized at this time. Associated free fluid tracks about the adjacent stomach, and extends along Gerota's fascia bilaterally. The liver and spleen are unremarkable in appearance. The gallbladder is within normal limits. The pancreas and adrenal glands are unremarkable. Mild nonspecific perinephric stranding is noted bilaterally. The kidneys are otherwise unremarkable. There is no evidence of hydronephrosis. No renal or ureteral stones are identified. No free fluid is identified. The small bowel is unremarkable in appearance. The stomach is within normal limits. No acute vascular abnormalities are seen. The patient's enteric tube is seen ending at the body of the stomach. The appendix is not definitely characterized; there is no evidence of appendicitis. The colon is largely decompressed and is unremarkable in appearance. The bladder is decompressed, with a Foley catheter in place. The prostate remains normal in size. No inguinal lymphadenopathy is seen. No acute osseous abnormalities are identified. IMPRESSION: 1. Worsening acute pancreatitis noted, with worsened soft tissue edema and inflammation noted about the entirety of the pancreas. Evaluation for devascularization is limited without contrast. No evidence of pseudocyst formation at this time. Associated free fluid tracks about the adjacent stomach, and extends along Gerota's fascia bilaterally. 2. New small volume ascites within the abdomen and pelvis. 3. Trace right and small left pleural effusions, new from the  prior study, with associated atelectasis. 4. Scattered coronary artery calcifications seen. Electronically Signed   By: Roanna RaiderJeffery  Chang M.D.   On: 12/18/2014 01:58   Ct Abdomen Pelvis Wo Contrast  12/14/2014  CLINICAL DATA:  Altered mental status, reason UTI, hypothermia, diabetes, elevated lactate EXAM: CT ABDOMEN AND PELVIS WITHOUT CONTRAST TECHNIQUE: Multidetector CT imaging of the abdomen and pelvis was performed following the standard protocol without IV contrast. COMPARISON:  None. FINDINGS: Lower chest:  No acute findings. Hepatobiliary: No mass visualized on this un-enhanced exam. Pancreas: Diffuse peripancreatic stranding edema evident which also extends along the retroperitoneum, duodenum, and transverse mesocolon. Findings are compatible with moderate to severe acute pancreatitis. No associated fluid collection, pseudocyst or abscess. No ductal dilatation. Spleen: Within normal limits in size. Adrenals/Urinary Tract: Normal adrenal glands. Minor nonspecific perinephric strandy edema without obstructing urinary tract or ureteral calculus. Stomach/Bowel: Slight thickening of the duodenum adjacent to the pancreas, suspect mild secondary duodenitis. No associated obstruction, ileus, or free air. Vascular/Lymphatic:  No adenopathy. Negative for aneurysm. Minor atherosclerosis of the bifurcation and iliac vessels. Reproductive: No mass or other significant abnormality. Other: No inguinal abnormality all are hernia. Musculoskeletal:  Minor degenerative changes of the lumbar spine. IMPRESSION: Moderate to severe acute pancreatitis without fluid collection, abscess or pseudocyst. Mild wall thickening of the adjacent duodenum, suspect reactive duodenitis secondary to the adjacent inflammatory process. No associated bowel obstruction or free air Electronically Signed   By: Judie Petit.  Shick M.D.   On: 12/14/2014 19:36   Ct Head Wo Contrast  12/14/2014  CLINICAL DATA:  Patient with altered mental status. Recent  diagnosis urinary tract infection. EXAM: CT HEAD WITHOUT CONTRAST TECHNIQUE: Contiguous axial images were obtained from the base of the skull through the vertex without intravenous contrast. COMPARISON:  MRI brain 01/25/2009. FINDINGS: Ventricles and sulci are appropriate for patient's age. No evidence for acute cortically based infarct, intracranial hemorrhage, mass lesion mass-effect. Orbits are unremarkable. Paranasal sinuses are well aerated. Mastoid air cells are unremarkable. Calvarium is intact. IMPRESSION: No acute intracranial process. Electronically Signed   By: Annia Belt M.D.   On: 12/14/2014 19:30   Dg Chest Port 1 View  12/18/2014  CLINICAL DATA:  Feeding tube placement EXAM: PORTABLE CHEST 1 VIEW COMPARISON:  12/18/2014 FINDINGS: Cardiomediastinal silhouette is stable. Persistent left base atelectasis or infiltrate. Endotracheal tube is unchanged in position. NG tube is unchanged in position. There is a second NG feeding tube with tip in mid stomach. No pneumothorax. Left subclavian central line is unchanged in position. IMPRESSION: Persistent left base atelectasis or infiltrate. Endotracheal tube is unchanged in position. NG tube is unchanged in position. There is a second NG feeding tube with tip in mid stomach. No pneumothorax. Left subclavian central line is unchanged in position. Electronically Signed   By: Natasha Mead M.D.   On: 12/18/2014 18:42   Dg Chest Port 1 View  12/18/2014  CLINICAL DATA:  Pancreatitis. EXAM: PORTABLE CHEST 1 VIEW COMPARISON:  12/17/2014 FINDINGS: Nasogastric tube has tip over the stomach in the left upper quadrant. Left subclavian central venous catheter unchanged with tip overlying the SVC. Interval extubation. Lungs are hypoinflated with slight worsening opacification in the left base likely small effusion with atelectasis. Mild stable cardiomegaly. Remainder of the exam is unchanged. IMPRESSION: Hypoinflation with slight worsening opacification left base  likely small effusion with atelectasis. Stable cardiomegaly. Tubes and lines as described. Electronically Signed   By: Elberta Fortis M.D.   On: 12/18/2014 09:34   Dg Chest Port 1 View  12/18/2014  CLINICAL DATA:  Post intubation.  Intentional self poisoning. EXAM: PORTABLE CHEST 1 VIEW COMPARISON:  12/18/2014 and 12/17/2014 FINDINGS: Nasogastric tube courses into the region of the stomach and off the film as the tip is not visualized. Left subclavian central venous catheter with tip overlying the SVC. Tip of endotracheal tube partially obscured by overlying EKG wires as tip appears to be approximately 2 cm above the carina. Lungs are hypoinflated with persistent left base opacification likely atelectasis/effusion. Minimal right perihilar opacification. Mild stable cardiomegaly. Remainder of the exam is unchanged. IMPRESSION: Hypoinflation. Mild left basilar opacification likely atelectasis and small effusion. Mild right perihilar opacification likely atelectasis. Tubes and lines as described. Electronically Signed   By: Elberta Fortis M.D.   On: 12/18/2014 09:19   Portable Chest Xray  12/17/2014  CLINICAL DATA:  Shortness of breath. EXAM: PORTABLE CHEST 1 VIEW COMPARISON:  12/16/2014. FINDINGS: Endotracheal tube and NG tube in stable position. Mediastinum and hilar structures are normal.  Stable cardiomegaly. Bibasilar subsegmental atelectasis again noted. Developing infiltrate left lower lobe cannot be excluded. No pleural effusion or pneumothorax. IMPRESSION: 1. Lines and tubes in stable position. 2. Persistent bibasilar subsegmental atelectasis. Developing left lower lobe infiltrate cannot be excluded . 3. Stable cardiomegaly . Electronically Signed   By: Maisie Fus  Register   On: 12/17/2014 07:38   Dg Chest Port 1 View  12/16/2014  CLINICAL DATA:  Pancreatitis EXAM: PORTABLE CHEST 1 VIEW COMPARISON:  Chest x-ray from earlier same day. FINDINGS: Endotracheal tube remains well positioned with tip just above  the level of the carina. Left subclavian central line has been placed in the interval with tip adequately positioned in the SVC. Enteric tube passes below the diaphragm. Pacer pad again noted over the mediastinum. Overall cardiomediastinal silhouette is stable in size and configuration. Mild atelectasis versus scarring within each lung, unchanged. No new lung findings. No pleural effusion seen. No pneumothorax seen. IMPRESSION: 1. Interval left-sided central line placement with tip well positioned in the SVC. 2. Otherwise stable chest x-ray. No new lung findings. No pneumothorax. Electronically Signed   By: Bary Richard M.D.   On: 12/16/2014 07:20   Portable Chest Xray  12/16/2014  CLINICAL DATA:  Endotracheal tube placement.  Initial encounter. EXAM: PORTABLE CHEST 1 VIEW COMPARISON:  Chest radiograph from 12/14/2014 FINDINGS: The patient's endotracheal tube is seen ending 2 cm above the carina. An enteric tube is noted extending below the diaphragm. The lungs are significantly hypoexpanded. Mild bibasilar atelectasis is noted. No pleural effusion or pneumothorax is seen. The cardiomediastinal silhouette is normal in size. No acute osseous abnormalities are seen. External pacing pads are noted. IMPRESSION: 1. Endotracheal tube seen ending 2 cm above the carina. 2. Lungs significantly hypoexpanded, with mild bibasilar atelectasis. Electronically Signed   By: Roanna Raider M.D.   On: 12/16/2014 01:15   Dg Chest Port 1 View  12/14/2014  CLINICAL DATA:  Altered Mental status, Elevated Blood/sugar/ Fever. HX: diabetes, CAD, Asthma, Cardiac Catherization EXAM: PORTABLE CHEST - 1 VIEW COMPARISON:  01/08/2010 FINDINGS: Low lung volumes. No focal infiltrate or overt edema. Heart size normal. No pneumothorax. No effusion. Visualized skeletal structures are unremarkable. IMPRESSION: Low lung volumes.  No acute cardiopulmonary disease. Electronically Signed   By: Corlis Leak M.D.   On: 12/14/2014 19:31   Dg Abd  Portable 1v  12/18/2014  CLINICAL DATA:  Enteric tube placed EXAM: PORTABLE ABDOMEN - 1 VIEW COMPARISON:  Abdominal radiograph 1 day prior FINDINGS: Enteric tube terminates in the proximal stomach with the side port in the very proximal stomach. No dilated small bowel loops or appreciable colonic stool. No evidence of pneumatosis or pneumoperitoneum. IMPRESSION: Enteric tube terminates in the proximal stomach. Electronically Signed   By: Delbert Phenix M.D.   On: 12/18/2014 10:22   Dg Abd Portable 1v  12/17/2014  CLINICAL DATA:  NG tube placement. EXAM: PORTABLE ABDOMEN - 1 VIEW COMPARISON:  Chest x-ray earlier today. FINDINGS: There has been placement of a nasogastric tube with tip and side-port over the region of the stomach in the left upper quadrant. Bowel gas pattern is nonobstructive. Minimal degenerative change of the spine. IMPRESSION: No acute findings. Nasogastric tube with tip and side-port over the stomach in the left upper quadrant. Electronically Signed   By: Elberta Fortis M.D.   On: 12/17/2014 16:45    ASSESSMENT AND PLAN  1. NSTEMI, unclear whether type 2 or true NSTEMI. Continue current meds. Will assess coronary perfusion once his respiratory status  has revealed itself.  2. NSVT - this has been quiet. Continue low dose metoprolol. No indication for amio at this point.  3. Acute on chronic renal insufficiency - note nice diuresis. Hopefully his renal function will improve 4. Acute pancreatitis - continue bowel rest. He still has pain in mid epigastric region.  5. Hypokalemia/calcemia - repletion as per ccm service  Gregg Taylor,M.D.  12/19/2014 11:03 AM

## 2014-12-20 ENCOUNTER — Inpatient Hospital Stay (HOSPITAL_COMMUNITY): Payer: BLUE CROSS/BLUE SHIELD

## 2014-12-20 DIAGNOSIS — R778 Other specified abnormalities of plasma proteins: Secondary | ICD-10-CM | POA: Diagnosis present

## 2014-12-20 DIAGNOSIS — I472 Ventricular tachycardia: Secondary | ICD-10-CM

## 2014-12-20 DIAGNOSIS — N179 Acute kidney failure, unspecified: Secondary | ICD-10-CM | POA: Diagnosis present

## 2014-12-20 DIAGNOSIS — R7989 Other specified abnormal findings of blood chemistry: Secondary | ICD-10-CM

## 2014-12-20 DIAGNOSIS — I4729 Other ventricular tachycardia: Secondary | ICD-10-CM

## 2014-12-20 DIAGNOSIS — R079 Chest pain, unspecified: Secondary | ICD-10-CM

## 2014-12-20 DIAGNOSIS — E876 Hypokalemia: Secondary | ICD-10-CM

## 2014-12-20 LAB — COMPREHENSIVE METABOLIC PANEL
ALT: 22 U/L (ref 17–63)
ANION GAP: 16 — AB (ref 5–15)
AST: 22 U/L (ref 15–41)
Albumin: 1.6 g/dL — ABNORMAL LOW (ref 3.5–5.0)
Alkaline Phosphatase: 50 U/L (ref 38–126)
BUN: 77 mg/dL — ABNORMAL HIGH (ref 6–20)
CHLORIDE: 109 mmol/L (ref 101–111)
CO2: 23 mmol/L (ref 22–32)
Calcium: 6.4 mg/dL — CL (ref 8.9–10.3)
Creatinine, Ser: 3.84 mg/dL — ABNORMAL HIGH (ref 0.61–1.24)
GFR, EST AFRICAN AMERICAN: 19 mL/min — AB (ref >60)
GFR, EST NON AFRICAN AMERICAN: 16 mL/min — AB (ref >60)
Glucose, Bld: 294 mg/dL — ABNORMAL HIGH (ref 65–99)
POTASSIUM: 3.1 mmol/L — AB (ref 3.5–5.1)
Sodium: 148 mmol/L — ABNORMAL HIGH (ref 135–145)
Total Bilirubin: 1 mg/dL (ref 0.3–1.2)
Total Protein: 5.1 g/dL — ABNORMAL LOW (ref 6.5–8.1)

## 2014-12-20 LAB — BASIC METABOLIC PANEL
ANION GAP: 15 (ref 5–15)
BUN: 82 mg/dL — ABNORMAL HIGH (ref 6–20)
CALCIUM: 6.5 mg/dL — AB (ref 8.9–10.3)
CO2: 24 mmol/L (ref 22–32)
CREATININE: 3.52 mg/dL — AB (ref 0.61–1.24)
Chloride: 110 mmol/L (ref 101–111)
GFR, EST AFRICAN AMERICAN: 21 mL/min — AB (ref >60)
GFR, EST NON AFRICAN AMERICAN: 18 mL/min — AB (ref >60)
GLUCOSE: 319 mg/dL — AB (ref 65–99)
Potassium: 3.1 mmol/L — ABNORMAL LOW (ref 3.5–5.1)
Sodium: 149 mmol/L — ABNORMAL HIGH (ref 135–145)

## 2014-12-20 LAB — CBC WITH DIFFERENTIAL/PLATELET
BASOS ABS: 0.1 10*3/uL (ref 0.0–0.1)
Basophils Relative: 1 %
EOS PCT: 0 %
Eosinophils Absolute: 0 10*3/uL (ref 0.0–0.7)
HEMATOCRIT: 33.3 % — AB (ref 39.0–52.0)
Hemoglobin: 11.5 g/dL — ABNORMAL LOW (ref 13.0–17.0)
LYMPHS PCT: 6 %
Lymphs Abs: 0.5 10*3/uL — ABNORMAL LOW (ref 0.7–4.0)
MCH: 29.6 pg (ref 26.0–34.0)
MCHC: 34.5 g/dL (ref 30.0–36.0)
MCV: 85.6 fL (ref 78.0–100.0)
MONOS PCT: 8 %
Monocytes Absolute: 0.7 10*3/uL (ref 0.1–1.0)
NEUTROS PCT: 85 %
Neutro Abs: 7.1 10*3/uL (ref 1.7–7.7)
Platelets: 142 10*3/uL — ABNORMAL LOW (ref 150–400)
RBC: 3.89 MIL/uL — AB (ref 4.22–5.81)
RDW: 15.8 % — ABNORMAL HIGH (ref 11.5–15.5)
WBC: 8.4 10*3/uL (ref 4.0–10.5)

## 2014-12-20 LAB — POCT I-STAT 3, ART BLOOD GAS (G3+)
ACID-BASE EXCESS: 3 mmol/L — AB (ref 0.0–2.0)
BICARBONATE: 25.7 meq/L — AB (ref 20.0–24.0)
O2 Saturation: 96 %
PO2 ART: 73 mmHg — AB (ref 80.0–100.0)
TCO2: 27 mmol/L (ref 0–100)
pCO2 arterial: 31.5 mmHg — ABNORMAL LOW (ref 35.0–45.0)
pH, Arterial: 7.522 — ABNORMAL HIGH (ref 7.350–7.450)

## 2014-12-20 LAB — GLUCOSE, CAPILLARY
GLUCOSE-CAPILLARY: 247 mg/dL — AB (ref 65–99)
Glucose-Capillary: 263 mg/dL — ABNORMAL HIGH (ref 65–99)
Glucose-Capillary: 265 mg/dL — ABNORMAL HIGH (ref 65–99)
Glucose-Capillary: 267 mg/dL — ABNORMAL HIGH (ref 65–99)
Glucose-Capillary: 277 mg/dL — ABNORMAL HIGH (ref 65–99)
Glucose-Capillary: 287 mg/dL — ABNORMAL HIGH (ref 65–99)

## 2014-12-20 LAB — PHOSPHORUS: PHOSPHORUS: 4.6 mg/dL (ref 2.5–4.6)

## 2014-12-20 LAB — CALCIUM, IONIZED: Calcium, Ionized, Serum: <3 mg/dL — ABNORMAL LOW (ref 4.5–5.6)

## 2014-12-20 LAB — TROPONIN I
TROPONIN I: 3.72 ng/mL — AB (ref <0.031)
Troponin I: 4.38 ng/mL (ref <0.031)
Troponin I: 3.51 ng/mL (ref <0.031)

## 2014-12-20 LAB — MAGNESIUM
MAGNESIUM: 2.1 mg/dL (ref 1.7–2.4)
Magnesium: 2.2 mg/dL (ref 1.7–2.4)

## 2014-12-20 LAB — PROCALCITONIN: PROCALCITONIN: 0.32 ng/mL

## 2014-12-20 MED ORDER — POTASSIUM CHLORIDE 10 MEQ/50ML IV SOLN
10.0000 meq | INTRAVENOUS | Status: AC
  Administered 2014-12-20 (×4): 10 meq via INTRAVENOUS
  Filled 2014-12-20 (×4): qty 50

## 2014-12-20 MED ORDER — PRO-STAT SUGAR FREE PO LIQD
30.0000 mL | Freq: Four times a day (QID) | ORAL | Status: DC
  Administered 2014-12-20 – 2014-12-23 (×14): 30 mL
  Filled 2014-12-20 (×20): qty 30

## 2014-12-20 MED ORDER — FUROSEMIDE 10 MG/ML IJ SOLN
20.0000 mg | Freq: Two times a day (BID) | INTRAMUSCULAR | Status: DC
  Administered 2014-12-20: 20 mg via INTRAVENOUS
  Filled 2014-12-20 (×2): qty 2

## 2014-12-20 MED ORDER — FENTANYL CITRATE (PF) 2500 MCG/50ML IJ SOLN
25.0000 ug/h | Status: DC
  Administered 2014-12-20: 300 ug/h via INTRAVENOUS
  Administered 2014-12-20 – 2014-12-21 (×2): 250 ug/h via INTRAVENOUS
  Administered 2014-12-22 (×2): 100 ug/h via INTRAVENOUS
  Administered 2014-12-22: 300 ug/h via INTRAVENOUS
  Filled 2014-12-20 (×5): qty 100

## 2014-12-20 MED ORDER — HYDRALAZINE HCL 20 MG/ML IJ SOLN: 10.0000 mg | INTRAMUSCULAR | Status: DC | PRN

## 2014-12-20 MED ORDER — VITAL HIGH PROTEIN PO LIQD
1000.0000 mL | ORAL | Status: DC
  Administered 2014-12-20 – 2014-12-23 (×3): 1000 mL
  Filled 2014-12-20 (×6): qty 1000

## 2014-12-20 MED ORDER — FENTANYL BOLUS VIA INFUSION
100.0000 ug | INTRAVENOUS | Status: DC | PRN
  Administered 2014-12-20 – 2014-12-22 (×22): 100 ug via INTRAVENOUS
  Filled 2014-12-20: qty 100

## 2014-12-20 MED ORDER — INSULIN NPH (HUMAN) (ISOPHANE) 100 UNIT/ML ~~LOC~~ SUSP
30.0000 [IU] | Freq: Two times a day (BID) | SUBCUTANEOUS | Status: DC
  Administered 2014-12-20 (×2): 30 [IU] via SUBCUTANEOUS
  Filled 2014-12-20: qty 10

## 2014-12-20 MED ORDER — INFLUENZA VAC SPLIT QUAD 0.5 ML IM SUSY: 0.5000 mL | PREFILLED_SYRINGE | INTRAMUSCULAR | Status: DC | PRN

## 2014-12-20 MED ORDER — POTASSIUM CHLORIDE 20 MEQ/15ML (10%) PO SOLN
40.0000 meq | Freq: Once | ORAL | Status: AC
  Administered 2014-12-20: 40 meq
  Filled 2014-12-20: qty 30

## 2014-12-20 MED ORDER — SODIUM CHLORIDE 0.45 % IV SOLN
INTRAVENOUS | Status: DC
  Administered 2014-12-20: 10 mL/h via INTRAVENOUS
  Administered 2014-12-22: 75 mL/h via INTRAVENOUS
  Administered 2014-12-22 – 2014-12-23 (×2): via INTRAVENOUS

## 2014-12-20 MED ORDER — FENTANYL CITRATE (PF) 100 MCG/2ML IJ SOLN: 100.0000 ug | INTRAMUSCULAR | Status: DC | PRN

## 2014-12-20 MED ORDER — ALBUTEROL SULFATE (2.5 MG/3ML) 0.083% IN NEBU
2.5000 mg | INHALATION_SOLUTION | Freq: Four times a day (QID) | RESPIRATORY_TRACT | Status: DC
  Administered 2014-12-20 – 2014-12-23 (×13): 2.5 mg via RESPIRATORY_TRACT
  Filled 2014-12-20 (×13): qty 3

## 2014-12-20 NOTE — Progress Notes (Signed)
eLink Physician-Brief Progress Note Patient Name: Brent ShamesDaniel J Tuggle DOB: 08-Feb-1960 MRN: [id number]   Date of Service  12/20/2014  HPI/Events of Note    eICU Interventions  Hypokalemia -repleted      Intervention Category Intermediate Interventions: Electrolyte abnormality - evaluation and management  ALVA,RAKESH V. 12/20/2014, 5:42 AM

## 2014-12-20 NOTE — Progress Notes (Signed)
PULMONARY / CRITICAL CARE MEDICINE   Name: Brent Rasmussen MRN: [id number] DOB: 1960/01/24    ADMISSION DATE:  12/14/2014 CONSULTATION DATE:  12/14/14  REFERRING MD :  [facility]  CHIEF COMPLAINT:  DKA, pancreatitis  INITIAL PRESENTATION: Brent Rasmussen is a 54 y.o. male that presented to [facility] for abdominal pain. He was found to have pancreatitis and be in DKA. There he was provided 3.5 L bolus. He was started on an insulin drip and was transferred to [facility] for care.  STUDIES:  11/22 MRI brain: No acute processes 11/22 CT abdomen: Moderate to severe acute pancreatitis without fluid collection, abscess or pseudocyst. 11/25 repeat CT >>>worsening diffuse pancreatitis, no abscess, no cysts  SIGNIFICANT EVENTS: 11/22 -Transferred to [facility] from [facility] 11/23- BM vasal vagals, cpr 2 min , ETT placed, on presors 11/24- aline placed, major improved pressors, volume given 11/25- extubated, escallating abdo pain 11/26- resp failure, re intubated 11/26- concern NSTEMI, heparin started 11/27- neg 6.4 liters  Cultures/Antibiotics:: BCx2 NG x5 days 11/24 Imipenem>>>  Lines: ETT: 11/23>>11/25, 11/26>> OGT: 11/26>>  SUBJECTIVE:  Intubated, reports continued CP stable from yesterday.  Abdominal pain present but mildly improved from yesterday.  Had BM overnight.  Asking for ETT to come out.  ROS: per HPI.  VITAL SIGNS: Temp:  [95 F (35 C)-100.6 F (38.1 C)] 99.4 F (37.4 C) (11/28 0422) Pulse Rate:  [93-112] 103 (11/28 0600) Resp:  [16-30] 16 (11/28 0600) BP: (96-159)/(60-80) 127/80 mmHg (11/28 0600) SpO2:  [90 %-95 %] 93 % (11/28 0600) Arterial Line BP: (141-174)/(64-76) 152/68 mmHg (11/28 0600) FiO2 (%):  [40 %-70 %] 50 % (11/28 0415) Weight:  [240 lb 8.4 oz (109.1 kg)] 240 lb 8.4 oz (109.1 kg) (11/28 0415) HEMODYNAMICS:   VENTILATOR SETTINGS: Vent Mode:  [-] PRVC FiO2 (%):  [40 %-70 %] 50 % Set Rate:  [16 bmp] 16 bmp Vt Set:  [580 mL]  580 mL PEEP:  [5 cmH20] 5 cmH20 Pressure Support:  [8 cmH20] 8 cmH20 Plateau Pressure:  [21 cmH20-23 cmH20] 23 cmH20 INTAKE / OUTPUT:  Intake/Output Summary (Last 24 hours) at 12/20/14 0711 Last data filed at 12/20/14 0600  Gross per 24 hour  Intake 2499.61 ml  Output   7050 ml  Net -4550.39 ml    PHYSICAL EXAMINATION: General: RASS 0, awake, alert, NAD Neuro:  RASS 0, follows commands, no focal deficits HEENT:  [place]/AT, EOMI, ETT in place Cardiovascular:  RRR, no murmurs Lungs:  CTAB, ETT in place Abdomen:  Obese, soft, mild epigastric TTP, no guarding Musculoskeletal:  Moves extremities independently, normal tone Skin:  No rashes  LABS:  CBC  Recent Labs Lab 12/17/14 0750 12/19/14 0545 12/20/14 0500  WBC 8.6 8.1 8.4  HGB 11.4* 11.0* 11.5*  HCT 31.5* 31.4* 33.3*  PLT 72* 91* 142*   Coag's  Recent Labs Lab 12/14/14 1810  INR 1.02   BMET  Recent Labs Lab 12/19/14 1645 12/19/14 2300 12/20/14 0500  NA 142 145 148*  K 2.8* 3.1* 3.1*  CL 104 108 109  CO2 21* 23 23  BUN 76* 77* 77*  CREATININE 4.33* 4.08* 3.84*  GLUCOSE 287* 267* 294*   Electrolytes  Recent Labs Lab 12/19/14 0845 12/19/14 1645 12/19/14 2105 12/19/14 2300 12/20/14 0500  CALCIUM 5.4* 5.9*  --  6.3* 6.4*  MG 1.8 2.0 2.0  --   --   PHOS 5.6* 5.5* 5.1*  --   --    Sepsis Markers  Recent Labs Lab 12/16/14 1106 12/16/14 1459 12/17/14 0216 12/17/14 2155 12/18/14 0036 12/18/14 0401  LATICACIDVEN  --  0.9  --  0.8 0.7  --   PROCALCITON 3.82  --  2.09  --   --  0.86   ABG  Recent Labs Lab 12/18/14 0751 12/18/14 1043 12/20/14 0258  PHART 7.295* 7.436 7.522*  PCO2ART 40.9 29.5* 31.5*  PO2ART 71.0* 269.0* 73.0*   Liver Enzymes  Recent Labs Lab 12/17/14 0216 12/18/14 0401 12/20/14 0500  AST 133* 49* 22  ALT 98* 58 22  ALKPHOS 41 50 50  BILITOT 0.5 1.1 1.0  ALBUMIN 1.5* 1.6* 1.6*   Cardiac Enzymes  Recent Labs Lab 12/18/14 1817 12/18/14 2015 12/19/14 0545   TROPONINI 4.35* 4.41* 4.62*   Glucose  Recent Labs Lab 12/19/14 0725 12/19/14 1119 12/19/14 1623 12/19/14 1951 12/19/14 2356 12/20/14 0421  GLUCAP 224* 246* 256* 220* 263* 265*    Imaging Dg Chest Port 1 View  12/20/2014  CLINICAL DATA:  Respiratory failure. EXAM: PORTABLE CHEST 1 VIEW COMPARISON:  12/18/2014 FINDINGS: The endotracheal tube tip is 4.4 cm above the carina. Nasogastric tube extends into the stomach. Left subclavian central line extends into the SVC. No pneumothorax. Mild left base consolidation persists without significant interval change. Small pleural effusions are probably present. IMPRESSION: Support equipment appears satisfactorily positioned. Unchanged left base consolidation and probable small bilateral effusions. Electronically Signed   By: Andreas Newport M.D.   On: 12/20/2014 03:53    ASSESSMENT / PLAN: PULMONARY Acute resp failure ATX R/o ards pulm edema Portable CXR with persistent left basilar consolidation P:  Neg balance SBT, unlikley to extubate until abdominal distention improves Follow CXR Consider SBT this am.  pH 7.522, pCO2 31.5. RR26  CARDIOVASCULAR Shock, SIRS, initially Hypovolemic, trop elevation, likley NSTEMI Output net neg 4.5L/ 24 hours Hypertensive this am 171/75 P:  Lasix IV 64m twice a day, seems to be responding well to this ASA MAP goals met on own Tele Echo ordered Repeat EKG this am, troponins  RENAL AKI in the setting of DKA/ dehydration, ATN, excellent output with lasix and reduction crt Metabolic Alk present: hypokalemia vs contraction alk in the setting of Lasix hypoKalemia R/o RTA 4 P:  KVO Lasix IV 40 BID BMP in am  K supp x40 mEq this am  GASTROINTESTINAL Severe pancreatitis, worsening edema on CT, at risk necrosis, at risk hemorrhagic conversion P:  Post pyloric feeds PPI  HEMATOLOGIC dvt prevention, thrombocytopenia (sirs), High risk hemorrhagic conversion pancreas P:  Repeat  CBC in am Sub q heparin Systemic heparin dc'd   INFECTIOUS Acute pancreatitis but no evidence of perforation, abscess or pseudocyst on CT  Shock 11/24, resolved P:  BCx2 NG x5 days  11/24 Imipenem>>>  PCT down trending, repeat today Low grade fever overnight 100.28F Stop date abx 7 days   ENDOCRINE H/o diabetes type 2, BGs 220-294/ 24 hours DKA, AG this am 16 P:  SSI Resistant Consider addition of basal insulin  NEUROLOGIC Metabolic encephalopathy again, pain P:  Fentanyl PRN WUA  FAMILY  - Updates: Wife updated 11/25, 11/26  - Inter-disciplinary family meet or Palliative Care meeting due by: 12/22/14    Ashly M. GLajuana Ripple DO PGY-2, [facility] 12/20/2014, 7:11 AM

## 2014-12-20 NOTE — Progress Notes (Signed)
CRITICAL VALUE ALERT  Critical value received:  Calcium 6.4  Date of notification:  12/20/14  Time of notification:  0538  Critical value read back:Yes.    Nurse who received alert:  Gena Frayhris RN  MD notified (1st page):  eLink MD  Time of first page:  0540  Responding MD:  Vassie LollAlva, MD  Time MD responded:  [phone]

## 2014-12-20 NOTE — Progress Notes (Signed)
Results for Leotis ShamesFALBAUM, Jailin J (MRN [id number]) as of 12/20/2014 10:26  Ref. Range 12/19/2014 16:23 12/19/2014 19:51 12/19/2014 23:56 12/20/2014 04:21 12/20/2014 07:34  Glucose-Capillary Latest Ref Range: 65-99 mg/dL 409256 (H) 811220 (H) 914263 (H) 265 (H) 247 (H)  Noted that NPH insulin has been ordered. If continuous tube feedings are to be started, recommend starting Lantus 20 units daily (instead of NPH), continuing Novolog correction scale every 4 hours, and then may need 3-4 units every 4 hours of TF coverage if blood sugars continue greater than 180 mg/dl. Will continue to monitor blood sugars while in the hospital. Smith MinceKendra Azreal Stthomas RN BSN CDE

## 2014-12-20 NOTE — Progress Notes (Signed)
TELEMETRY: Reviewed telemetry pt in NSR: Filed Vitals:   12/20/14 0500 12/20/14 0600 12/20/14 0735 12/20/14 0737  BP: 121/77 127/80  171/75  Pulse: 106 103  101  Temp:   99.8 F (37.7 C)   TempSrc:   Oral   Resp: 21 16    Height:      Weight:      SpO2: 94% 93%      Intake/Output Summary (Last 24 hours) at 12/20/14 0928 Last data filed at 12/20/14 0902  Gross per 24 hour  Intake 2438.99 ml  Output   7575 ml  Net -5136.01 ml   Filed Weights   12/18/14 0345 12/19/14 0500 12/20/14 0415  Weight: 121.5 kg (267 lb 13.7 oz) 112.8 kg (248 lb 10.9 oz) 109.1 kg (240 lb 8.4 oz)    Subjective Patient awake on vent. Denies chest pain. Wants ET tube out.   Marland Kitchen. antiseptic oral rinse  7 mL Mouth Rinse QID  . aspirin  325 mg Oral Daily  . atorvastatin  40 mg Oral q1800  . chlorhexidine gluconate  15 mL Mouth Rinse BID  . feeding supplement (PRO-STAT SUGAR FREE 64)  30 mL Per Tube BID  . furosemide  40 mg Intravenous Q12H  . heparin subcutaneous  5,000 Units Subcutaneous 3 times per day  . hydrocortisone sodium succinate  25 mg Intravenous Q6H  . imipenem-cilastatin  250 mg Intravenous 4 times per day  . Influenza vac split quadrivalent PF  0.5 mL Intramuscular Tomorrow-1000  . insulin aspart  0-20 Units Subcutaneous 6 times per day  . metoprolol tartrate  12.5 mg Oral BID  . pantoprazole (PROTONIX) IV  40 mg Intravenous Q24H  . potassium chloride  10 mEq Intravenous Q1 Hr x 4   . sodium chloride 10 mL/hr (12/18/14 1100)  . feeding supplement (VITAL HIGH PROTEIN) Stopped (12/19/14 1030)  . fentaNYL infusion INTRAVENOUS 50 mcg/hr (12/20/14 0650)  . insulin (NOVOLIN-R) infusion Stopped (12/17/14 1109)    LABS: Basic Metabolic Panel:  Recent Labs  30/86/5711/27/16 1645 12/19/14 2105 12/19/14 2300 12/20/14 0500  NA 142  --  145 148*  K 2.8*  --  3.1* 3.1*  CL 104  --  108 109  CO2 21*  --  23 23  GLUCOSE 287*  --  267* 294*  BUN 76*  --  77* 77*  CREATININE 4.33*  --  4.08* 3.84*   CALCIUM 5.9*  --  6.3* 6.4*  MG 2.0 2.0  --   --   PHOS 5.5* 5.1*  --   --    Liver Function Tests:  Recent Labs  12/18/14 0401 12/20/14 0500  AST 49* 22  ALT 58 22  ALKPHOS 50 50  BILITOT 1.1 1.0  PROT 4.6* 5.1*  ALBUMIN 1.6* 1.6*    Recent Labs  12/18/14 0830  LIPASE 110*  AMYLASE 144*   CBC:  Recent Labs  12/19/14 0545 12/20/14 0500  WBC 8.1 8.4  NEUTROABS  --  7.1  HGB 11.0* 11.5*  HCT 31.4* 33.3*  MCV 83.5 85.6  PLT 91* 142*   Cardiac Enzymes:  Recent Labs  12/18/14 1817 12/18/14 2015 12/19/14 0545  TROPONINI 4.35* 4.41* 4.62*   BNP: No results for input(s): PROBNP in the last 72 hours. D-Dimer: No results for input(s): DDIMER in the last 72 hours. Hemoglobin A1C: No results for input(s): HGBA1C in the last 72 hours. Fasting Lipid Panel: No results for input(s): CHOL, HDL, LDLCALC, TRIG, CHOLHDL, LDLDIRECT in the last  72 hours. Thyroid Function Tests: No results for input(s): TSH, T4TOTAL, T3FREE, THYROIDAB in the last 72 hours.  Invalid input(s): FREET3   Radiology/Studies:  Dg Chest Port 1 View  12/20/2014  CLINICAL DATA:  Respiratory failure. EXAM: PORTABLE CHEST 1 VIEW COMPARISON:  12/18/2014 FINDINGS: The endotracheal tube tip is 4.4 cm above the carina. Nasogastric tube extends into the stomach. Left subclavian central line extends into the SVC. No pneumothorax. Mild left base consolidation persists without significant interval change. Small pleural effusions are probably present. IMPRESSION: Support equipment appears satisfactorily positioned. Unchanged left base consolidation and probable small bilateral effusions. Electronically Signed   By: Ellery Plunk M.D.   On: 12/20/2014 03:53   Dg Chest Port 1 View  12/18/2014  CLINICAL DATA:  Feeding tube placement EXAM: PORTABLE CHEST 1 VIEW COMPARISON:  12/18/2014 FINDINGS: Cardiomediastinal silhouette is stable. Persistent left base atelectasis or infiltrate. Endotracheal tube is  unchanged in position. NG tube is unchanged in position. There is a second NG feeding tube with tip in mid stomach. No pneumothorax. Left subclavian central line is unchanged in position. IMPRESSION: Persistent left base atelectasis or infiltrate. Endotracheal tube is unchanged in position. NG tube is unchanged in position. There is a second NG feeding tube with tip in mid stomach. No pneumothorax. Left subclavian central line is unchanged in position. Electronically Signed   By: Natasha Mead M.D.   On: 12/18/2014 18:42    PHYSICAL EXAM General: Well developed, well nourished, in no acute distress. Head: Normocephalic. ET tube in place. Neck: Negative for carotid bruits. JVD not elevated. No adenopathy Lungs: Clear bilaterally to auscultation without wheezes, rales, or rhonchi. Breathing is unlabored. Heart: RRR S1 S2 without murmurs, rubs, or gallops.  Abdomen: Soft, mildly tender, distended with normoactive bowel sounds. No hepatomegaly. No rebound/guarding. No obvious abdominal masses. Extremities: No clubbing, cyanosis or edema.  Distal pedal pulses are 2+ and equal bilaterally. Neuro: Alert. Moves all extremities spontaneously.   ASSESSMENT AND PLAN:  54 yo man with PMH of T2DM, CAD (prior stent 5-10 years ago at [facility]), Asthma who developed abdominal pain with associated nausea/vomiting a week ago Saturday(11/19) with progressive pain that led him to seek urgent care at [facility] where he was diagnosed with acute pancreatitis with hypertriglyceridemia and DKA leading to transfer to [facility]. He has been treated in the ICU with critical illness. Cardiology consulted this evening given a run of NSVT (35 beats) in setting of renal failure and K 3.0 with troponin of 4.6.  1. Elevated troponin. Suspect related to demand ischemia is setting of critical illness. No serial Ecg changes. 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 Will await Echo results. Continue ASA and beta blocker.  Could increase metoprolol if needed. On high dose statin. I don't think he will be a candidate for invasive cardiac evaluation any time soon with ARF. 2. NSVT secondary to hypokalemia. No recurrence. 3. Acute pancreatitis.  4. Acute respiratory failure due to SIRS. Diuresing very well. Per CCM.  5. ARF 6. DM type 2 with recent DKA. 7. Hypokalemia/hypocalcemia- repleating.   Present on Admission:  . DKA (diabetic ketoacidoses) (HCC)  Signed, Janah Mcculloh [place], MDFACC 12/20/2014 9:28 AM

## 2014-12-20 NOTE — Progress Notes (Signed)
  Echocardiogram 2D Echocardiogram has been performed.  Arvil ChacoFoster, Vilma Will 12/20/2014, 2:17 PM

## 2014-12-20 NOTE — Progress Notes (Signed)
Nutrition Follow-up  DOCUMENTATION CODES:   Obesity unspecified  INTERVENTION:    When Cortrak tube in post-pyloric position, start TF with Vital High Protein at 25 ml/h and Prostat 30 ml QID on day 1; on day 2, increase to goal rate of 45 ml/h (1080 ml per day) to provide 1480 kcals, 155 gm protein, 903 ml free water daily.  NUTRITION DIAGNOSIS:   Inadequate oral intake related to inability to eat as evidenced by NPO status.  Ongoing  GOAL:   Provide needs based on ASPEN/SCCM guidelines  Progressing  MONITOR:   Labs, Vent status, Weight trends, TF tolerance, I & O's  REASON FOR ASSESSMENT:   Consult Enteral/tube feeding initiation and management  ASSESSMENT:   54 y/o male PMHx DM, CAD, Bell's Palsy who presented to hospital for abdominal pain and nausea. Found to have pancreatitis and be in DKA. Endorses c/n/v and poor PO intake. 11/23 code blue called. CPR x2 min with cpr rosc after 2 min. Intubated on 11/24 due to worsening respiratory status. Extubated 11/25. Reintubated 11/26. Cortrak placed 11/26  Labs reviewed: sodium elevated, potassium low  Tip of Cortrak tube in the stomach, Cortrak team to re-evaluate and try to place in post pyloric position before starting TF.   Patient is currently intubated on ventilator support Temp (24hrs), Avg:98.5 F (36.9 C), Min:95.7 F (35.4 C), Max:100.6 F (38.1 C)  Propofol: none   Diet Order:  Diet NPO time specified  Skin:  Reviewed, no issues  Last BM:  11/26-incontinent  Height:   Ht Readings from Last 1 Encounters:  12/15/14 5\' 10"  (1.778 m)    Weight:   Wt Readings from Last 1 Encounters:  12/20/14 240 lb 8.4 oz (109.1 kg)  12/14/14 230 lb (104.327 kg) BMI=33.1 on admission   Ideal Body Weight:  75.45 kg  BMI:  Body mass index is 34.51 kg/(m^2).  Estimated Nutritional Needs:   Kcal:  [id number]  Protein:  >/= 151 gm  Fluid:  2 L  EDUCATION NEEDS:   No education needs identified at this  time  Joaquin CourtsKimberly Shaden Lacher, RD, LDN, CNSC Pager [phone] After Hours Pager [phone]

## 2014-12-20 NOTE — Progress Notes (Signed)
eLink Physician-Brief Progress Note Patient Name: Brent ShamesDaniel J Rasmussen DOB: 1960-05-14 MRN: [id number]   Date of Service  12/20/2014  HPI/Events of Note  K+ = 3.1 and Creatinine = 3.52.  eICU Interventions  Will replete K+.     Intervention Category Intermediate Interventions: Electrolyte abnormality - evaluation and management  Lenell AntuSommer,Steven Eugene 12/20/2014, 4:47 PM

## 2014-12-21 ENCOUNTER — Inpatient Hospital Stay (HOSPITAL_COMMUNITY): Payer: BLUE CROSS/BLUE SHIELD

## 2014-12-21 DIAGNOSIS — I5021 Acute systolic (congestive) heart failure: Secondary | ICD-10-CM

## 2014-12-21 DIAGNOSIS — N170 Acute kidney failure with tubular necrosis: Secondary | ICD-10-CM

## 2014-12-21 LAB — BASIC METABOLIC PANEL
ANION GAP: 11 (ref 5–15)
BUN: 73 mg/dL — ABNORMAL HIGH (ref 6–20)
CALCIUM: 7.6 mg/dL — AB (ref 8.9–10.3)
CHLORIDE: 125 mmol/L — AB (ref 101–111)
CO2: 28 mmol/L (ref 22–32)
Creatinine, Ser: 2.4 mg/dL — ABNORMAL HIGH (ref 0.61–1.24)
GFR calc non Af Amer: 29 mL/min — ABNORMAL LOW (ref >60)
GFR, EST AFRICAN AMERICAN: 34 mL/min — AB (ref >60)
Glucose, Bld: 229 mg/dL — ABNORMAL HIGH (ref 65–99)
POTASSIUM: 3.4 mmol/L — AB (ref 3.5–5.1)
Sodium: 164 mmol/L (ref 135–145)

## 2014-12-21 LAB — CBC WITH DIFFERENTIAL/PLATELET
Basophils Absolute: 0.1 10*3/uL (ref 0.0–0.1)
Basophils Relative: 1 %
EOS ABS: 0.1 10*3/uL (ref 0.0–0.7)
Eosinophils Relative: 1 %
HCT: 35.8 % — ABNORMAL LOW (ref 39.0–52.0)
HEMOGLOBIN: 11.7 g/dL — AB (ref 13.0–17.0)
LYMPHS ABS: 0.9 10*3/uL (ref 0.7–4.0)
LYMPHS PCT: 9 %
MCH: 29 pg (ref 26.0–34.0)
MCHC: 32.7 g/dL (ref 30.0–36.0)
MCV: 88.6 fL (ref 78.0–100.0)
MONOS PCT: 4 %
Monocytes Absolute: 0.4 10*3/uL (ref 0.1–1.0)
NEUTROS PCT: 85 %
Neutro Abs: 8.7 10*3/uL — ABNORMAL HIGH (ref 1.7–7.7)
Platelets: 158 10*3/uL (ref 150–400)
RBC: 4.04 MIL/uL — ABNORMAL LOW (ref 4.22–5.81)
RDW: 15.9 % — ABNORMAL HIGH (ref 11.5–15.5)
WBC: 10 10*3/uL (ref 4.0–10.5)

## 2014-12-21 LAB — RENAL FUNCTION PANEL
ANION GAP: 12 (ref 5–15)
Albumin: 1.7 g/dL — ABNORMAL LOW (ref 3.5–5.0)
BUN: 83 mg/dL — ABNORMAL HIGH (ref 6–20)
CALCIUM: 7.1 mg/dL — AB (ref 8.9–10.3)
CHLORIDE: 119 mmol/L — AB (ref 101–111)
CO2: 28 mmol/L (ref 22–32)
Creatinine, Ser: 2.81 mg/dL — ABNORMAL HIGH (ref 0.61–1.24)
GFR calc Af Amer: 28 mL/min — ABNORMAL LOW (ref >60)
GFR calc non Af Amer: 24 mL/min — ABNORMAL LOW (ref >60)
GLUCOSE: 291 mg/dL — AB (ref 65–99)
POTASSIUM: 2.9 mmol/L — AB (ref 3.5–5.1)
Phosphorus: 4.2 mg/dL (ref 2.5–4.6)
SODIUM: 159 mmol/L — AB (ref 135–145)

## 2014-12-21 LAB — MAGNESIUM
Magnesium: 2.5 mg/dL — ABNORMAL HIGH (ref 1.7–2.4)
Magnesium: 2.5 mg/dL — ABNORMAL HIGH (ref 1.7–2.4)

## 2014-12-21 LAB — GLUCOSE, CAPILLARY
GLUCOSE-CAPILLARY: 256 mg/dL — AB (ref 65–99)
Glucose-Capillary: 248 mg/dL — ABNORMAL HIGH (ref 65–99)
Glucose-Capillary: 264 mg/dL — ABNORMAL HIGH (ref 65–99)
Glucose-Capillary: 281 mg/dL — ABNORMAL HIGH (ref 65–99)

## 2014-12-21 LAB — CALCIUM, IONIZED: Calcium, Ionized, Serum: 3.5 mg/dL — ABNORMAL LOW (ref 4.5–5.6)

## 2014-12-21 LAB — PROCALCITONIN: Procalcitonin: 0.3 ng/mL

## 2014-12-21 MED ORDER — POTASSIUM CHLORIDE 20 MEQ/15ML (10%) PO SOLN
40.0000 meq | Freq: Once | ORAL | Status: AC
  Administered 2014-12-21: 40 meq
  Filled 2014-12-21: qty 30

## 2014-12-21 MED ORDER — INSULIN NPH (HUMAN) (ISOPHANE) 100 UNIT/ML ~~LOC~~ SUSP
50.0000 [IU] | Freq: Two times a day (BID) | SUBCUTANEOUS | Status: DC
  Administered 2014-12-21 (×2): 50 [IU] via SUBCUTANEOUS
  Filled 2014-12-21: qty 10

## 2014-12-21 MED ORDER — CARVEDILOL 6.25 MG PO TABS
6.2500 mg | ORAL_TABLET | Freq: Two times a day (BID) | ORAL | Status: DC
  Administered 2014-12-21 – 2014-12-23 (×5): 6.25 mg via ORAL
  Filled 2014-12-21 (×8): qty 1

## 2014-12-21 MED ORDER — FREE WATER
200.0000 mL | Status: DC
  Administered 2014-12-21 – 2014-12-23 (×10): 200 mL

## 2014-12-21 MED ORDER — POTASSIUM CHLORIDE 10 MEQ/50ML IV SOLN
10.0000 meq | INTRAVENOUS | Status: AC
  Administered 2014-12-21 (×3): 10 meq via INTRAVENOUS
  Filled 2014-12-21 (×3): qty 50

## 2014-12-21 MED ORDER — POTASSIUM CHLORIDE 10 MEQ/50ML IV SOLN
10.0000 meq | INTRAVENOUS | Status: AC
  Administered 2014-12-21 (×4): 10 meq via INTRAVENOUS
  Filled 2014-12-21 (×4): qty 50

## 2014-12-21 MED ORDER — PANTOPRAZOLE SODIUM 40 MG PO PACK
40.0000 mg | PACK | Freq: Every day | ORAL | Status: DC
  Administered 2014-12-21 – 2014-12-29 (×8): 40 mg
  Filled 2014-12-21 (×9): qty 20

## 2014-12-21 NOTE — Progress Notes (Signed)
Patient Name: Brent Rasmussen Date of Encounter: 12/21/2014     Active Problems:   DKA (diabetic ketoacidoses) (HCC)   Pancreatitis   Acute respiratory failure with hypoxia (HCC)   Pancreatitis, acute   Encounter for intubation   Encounter for orogastric (OG) tube placement   Encounter for nasogastric (NG) tube placement   NSVT (nonsustained ventricular tachycardia) (HCC)   Elevated troponin   Hypokalemia   Acute renal failure (HCC)    SUBJECTIVE  Patient intubated with orogastric and nasogastric tube placed. He cannot talk. We spoke through head nods and writing on a pad of paper. He complains of belly pain and chest pain. 8-9/10, although he looks relatively comfortable. He has a history of stenting. Difficult to get details when non verbal. He has never been told he has a weak heart.   CURRENT MEDS . albuterol  2.5 mg Nebulization Q6H  . antiseptic oral rinse  7 mL Mouth Rinse QID  . aspirin  325 mg Oral Daily  . atorvastatin  40 mg Oral q1800  . chlorhexidine gluconate  15 mL Mouth Rinse BID  . feeding supplement (PRO-STAT SUGAR FREE 64)  30 mL Per Tube QID  . free water  200 mL Per Tube Q4H  . heparin subcutaneous  5,000 Units Subcutaneous 3 times per day  . imipenem-cilastatin  250 mg Intravenous 4 times per day  . insulin aspart  0-20 Units Subcutaneous 6 times per day  . insulin NPH Human  50 Units Subcutaneous BID AC & HS  . metoprolol tartrate  12.5 mg Oral BID  . pantoprazole sodium  40 mg Per Tube Daily    OBJECTIVE  Filed Vitals:   12/21/14 0752 12/21/14 0800 12/21/14 0830 12/21/14 0944  BP: 154/74 126/82  144/71  Pulse: 88 91 99 98  Temp:  97.9 F (36.6 C) 97.9 F (36.6 C)   TempSrc:  Core (Comment)    Resp: Height:      Weight:      SpO2:  93% 96%     Intake/Output Summary (Last 24 hours) at 12/21/14 1118 Last data filed at 12/21/14 0808  Gross per 24 hour  Intake 1919.05 ml  Output   6040 ml  Net -4120.95 ml   Filed Weights    12/19/14 0500 12/20/14 0415 12/21/14 0500  Weight: 248 lb 10.9 oz (112.8 kg) 240 lb 8.4 oz (109.1 kg) 225 lb 5 oz (102.2 kg)    PHYSICAL EXAM  General: Pleasant, NAD. ET tube in place. Neuro: Alert and oriented X 3. Moves all extremities spontaneously. Psych: Normal affect. HEENT:  Normal  Neck: Supple without bruits or JVD. Lungs:  Resp regular and unlabored, CTA. Heart: RRR no s3, s4, or murmurs. Abdomen: Soft, non-tender, non-distended, BS + x 4.  Extremities: No clubbing, cyanosis or edema. DP/PT/Radials 2+ and equal bilaterally.  Accessory Clinical Findings  CBC  Recent Labs  12/20/14 0500 12/21/14 0500  WBC 8.4 10.0  NEUTROABS 7.1 8.7*  HGB 11.5* 11.7*  HCT 33.3* 35.8*  MCV 85.6 88.6  PLT 142* 158   Basic Metabolic Panel  Recent Labs  12/20/14 0927 12/20/14 1554 12/21/14 0500  NA  --  149* 159*  K  --  3.1* 2.9*  CL  --  110 119*  CO2  --  24 28  GLUCOSE  --  319* 291*  BUN  --  82* 83*  CREATININE  --  3.52* 2.81*  CALCIUM  --  6.5* 7.1*  MG 2.2 2.1 2.5*  PHOS 4.6  --  4.2   Liver Function Tests  Recent Labs  12/20/14 0500 12/21/14 0500  AST 22  --   ALT 22  --   ALKPHOS 50  --   BILITOT 1.0  --   PROT 5.1*  --   ALBUMIN 1.6* 1.7*   No results for input(s): LIPASE, AMYLASE in the last 72 hours. Cardiac Enzymes  Recent Labs  12/20/14 0928 12/20/14 1554 12/20/14 2029  TROPONINI 4.38* 3.51* 3.72*    TELE  NSR with no recurrent NSVT  Radiology/Studies   2D ECHO: 12/20/2014 LV EF: 30 - 35% Study Conclusions - Left ventricle: The cavity size was normal. Wall thickness was   normal. Systolic function was moderately to severely reduced. The   estimated ejection fraction was in the range of 30% to 35%.   Severe hypokinesis of the mid-apicalanteroseptal, anterior, and   apical myocardium. Doppler parameters are consistent with   abnormal left ventricular relaxation (grade 1 diastolic   dysfunction). - Atrial septum: No defect  or patent foramen ovale was identified.  ASSESSMENT AND PLAN 54 yo man with PMH of T2DM, CAD ([facility]) and asthma who developed abdominal pain with associated nausea/vomiting a week ago Saturday(11/19) with progressive pain that led him to seek urgent care at [facility] where he was diagnosed with acute pancreatitis with hypertriglyceridemia and DKA leading to [facility]. He has been treated in the ICU with critical illness. Cardiology consulted this evening given a run of NSVT (35 beats) in setting of renal failure and K 3.0 with troponin of 4.6.  Elevated troponin. Suspect related to demand ischemia is setting of critical illness. No serial Ecg changes. 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  He does complain of some chest pain today, although he appears relatively comfortable. Will obtain an ECG.  -- 2D ECHO with EF 30-35% and severe HK of the mid-apicalanteroseptal, anterior, and apical myocardium. G1DD. No previous ECHO to compare. Possibly new WMA and new low EF? Patient denies hx of CHF  -- Continue ASA and beta blocker. Could increase metoprolol if needed. On high dose statin. -- He has presumed newly reduced EF with WMA, old MI by ECG, positive cardiac enzymes and chest pain. This clinical situation if worrisome for ischemic etiology; however, he is not a candidate for invasive cardiac evaluation at this time with ARF. We will continue to treat medically for now.  Acute systolic CHF - EF 30-35% and severe HK of the mid-apicalanteroseptal, anterior, and apical myocardium. G1DD. No previous ECHO to compare. Possibly new WMA and newly reduced EF. -- He was on IV lasix but this has been discontinued. He had vigorous diuresis ( down about 13 L per RN) He is up 1 L in I/Os. Weights appear inaccurate but he does seem to be down quite a bit. He has be started on free water for hypernatremia.  -- Continue BB. Consider adding hydralazine +  nitrates as he is not a candidate for ACE/ARB with ARF when BPs stable  Hypernatremia- continue free water  NSVT secondary to hypokalemia. No recurrence.  Acute pancreatitis- 2/2 hypertriglyceridemia per primary service  Acute respiratory failure due to SIRS. Her had brisk diuresis. Per PCCM   ARF- creat improving. 5.3--> 2.81 today  DM type 2 with recent DKA.  Hypokalemia/hypocalcemia- K 2.9 and Ca 7.1. Being repleted by primary service.  Brent Fischer PA-C  Pager [phone]  Patient seen and examined and history reviewed. Agree with above findings and plan. Patient awake and alert on vent. Uncomfortable with continued abdominal pain. Excellent diuresis with negative 4700 cc yesterday. Renal indices improving. Need to replete potassium. Echo shows significant LV dysfunction with EF 30-35%. More severe hypokinesis of the anterior wall. Ecg suggests an old anterior MI but patient denies history of MI in the past. He had a stent for a blockage about 8 yrs ago at [facility]. Will request records. Apparently EF was normal in the past. Troponins elevated on admission with fairly flat curve. This may all be related to demand ischemia but at some point we will need to assess coronary status. Timing will depend on recovery of renal function. In the meantime will try and optimize medical therapy. Switch metoprolol to Coreg. As BP allows will consider adding nitrates and hydralazine but need to avoid hypotension now. Diuretics per CCM.   Brent Rasmussen, MDFACC 12/21/2014 12:21 PM

## 2014-12-21 NOTE — Clinical Documentation Improvement (Signed)
Critical Care  Please clarify if the following diagnosis, Sepsis was:   Present at the time of admission (POA)  NOT present at the time of admission and it developed during the inpatient stay  Unable to clinically determine whether the condition was present on admission.  Unknown   Supporting Information: 12/14/14 Lactic acid (6.59/2.59) , WBC (15.4/14.6), Pulse range (114-133) resp range (21-29), temp range (94.0-97)   Please exercise your independent, professional judgment when responding. A specific answer is not anticipated or expected.   Thank You,  Lavonda JumboLawanda J Ivan Maskell Health Information Management [place] [phone]

## 2014-12-21 NOTE — Clinical Documentation Improvement (Signed)
Critical Care  Documentation of " pulmonary edema" on 12/19/14 please provide acuity of diagnosis. Thank you     Acute Pulmonary Edema  Acute on chronic Pulmonary Edema  Other  Clinically Undetermined  Supporting Information:  PORTABLE CHEST - 1 VIEW  COMPARISON: 01/08/2010  FINDINGS: Low lung volumes. No focal infiltrate or overt edema. Heart size normal. No pneumothorax. No effusion. Visualized skeletal structures are unremarkable.  IMPRESSION: Low lung volumes. No acute cardiopulmonary disease.   Please exercise your independent, professional judgment when responding. A specific answer is not anticipated or expected.   Thank You, Lavonda JumboLawanda J Kailand Seda Health Information Management [place] [phone]

## 2014-12-21 NOTE — Progress Notes (Signed)
Results for Leotis ShamesFALBAUM, Webster J (MRN [id number]) as of 12/21/2014 15:16  Ref. Range 12/20/2014 15:22 12/20/2014 19:25 12/21/2014 00:00 12/21/2014 03:36 12/21/2014 08:05  Glucose-Capillary Latest Ref Range: 65-99 mg/dL 324277 (H) 401267 (H) 027281 (H) 264 (H) 248 (H)  CBGs continue to be greater than 180 mg/dl. On continuous tube feedings. Recommend changing basal insulin to Lantus 25-30 units daily and continuing Novolog correction scale every 4 hours and may need to add Novolog 4 units every 4 hours to cover tube feedings. Will continue to follow while in hospital. Smith MinceKendra Hydie Langan RN BSN CDE

## 2014-12-21 NOTE — Progress Notes (Signed)
PULMONARY / CRITICAL CARE MEDICINE   Name: Brent Rasmussen MRN: [id number] DOB: Jun 16, 1960    ADMISSION DATE:  12/14/2014 CONSULTATION DATE:  12/14/14  REFERRING MD :  [facility]  CHIEF COMPLAINT:  DKA, pancreatitis  INITIAL PRESENTATION: Brent Rasmussen is a 54 y.o. male that presented to [facility] for abdominal pain. He was found to have pancreatitis and be in DKA. There he was provided 3.5 L bolus. He was started on an insulin drip and was [facility]  STUDIES:  11/22 MRI brain: No acute processes 11/22 CT abdomen: Moderate to severe acute pancreatitis without fluid collection, abscess or pseudocyst. 11/25 repeat CT: Worsening diffuse pancreatitis, no abscess, no cysts 11/28 TTE:  EF 30-35% w/ severe hypokinesis & anterior wall motion abnormality.  SIGNIFICANT EVENTS: 11/22 -[facility] 11/23- BM vasal vagals, cpr 2 min , ETT placed, on presors 11/24- aline placed, major improved pressors, volume given 11/25- extubated, escallating abdo pain 11/26- resp failure, re intubated 11/26- concern NSTEMI, heparin started 11/27- neg 6.4 liters  Cultures: BCx2 11/22:  Negative  Antibiotics: Imipenem 11/24>>>  Lines: ETT: 11/23 - 11/25, 11/26>> OGT: 11/26 - 11/29 DHT:  11/28>>  SUBJECTIVE: No acute events overnight. Patient remains awake in no distress. Diuresing excessively.   ROS: Unable to obtain given intubation.  VITAL SIGNS: Temp:  [97.5 F (36.4 C)-102 F (38.9 C)] 98.5 F (36.9 C) (11/29 1541) Pulse Rate:  [82-121] 82 (11/29 1540) Resp:  [12-29] 12 (11/29 1540) BP: (95-154)/(54-87) 96/64 mmHg (11/29 1540) SpO2:  [89 %-97 %] 93 % (11/29 1540) Arterial Line BP: (108-157)/(51-104) 126/65 mmHg (11/29 1430) FiO2 (%):  [35 %-50 %] 35 % (11/29 1540) Weight:  [225 lb 5 oz (102.2 kg)] 225 lb 5 oz (102.2 kg) (11/29 0500) HEMODYNAMICS: CVP:  [5 mmHg] 5 mmHg VENTILATOR SETTINGS: Vent Mode:  [-]  CPAP;PSV FiO2 (%):  [35 %-50 %] 35 % Set Rate:  [16 bmp] 16 bmp Vt Set:  [580 mL] 580 mL PEEP:  [5 cmH20] 5 cmH20 Pressure Support:  [8 cmH20-10 cmH20] 10 cmH20 Plateau Pressure:  [14 cmH20-20 cmH20] 18 cmH20 INTAKE / OUTPUT:  Intake/Output Summary (Last 24 hours) at 12/21/14 1544 Last data filed at 12/21/14 1500  Gross per 24 hour  Intake 2182.99 ml  Output   4530 ml  Net -2347.01 ml    PHYSICAL EXAMINATION: General:  Awake. No acute distress. Sitting watching TV.  Integument:  Warm & dry. No rash on exposed skin.  HEENT:  Endotracheal tube in place. PERRL. No scleral icterus. Cardiovascular:  Regular rate. No appreciable JVD.  Pulmonary:  Overall clear to auscultation bilaterally. Symmetric chest wall rise on ventilator. Abdomen: Soft. Normal bowel sounds. Protuberant. Neurological:  Following commands. Moving all 4 extremities. Writing notes appropriately.  LABS:  CBC  Recent Labs Lab 12/19/14 0545 12/20/14 0500 12/21/14 0500  WBC 8.1 8.4 10.0  HGB 11.0* 11.5* 11.7*  HCT 31.4* 33.3* 35.8*  PLT 91* 142* 158   Coag's  Recent Labs Lab 12/14/14 1810  INR 1.02   BMET  Recent Labs Lab 12/20/14 0500 12/20/14 1554 12/21/14 0500  NA 148* 149* 159*  K 3.1* 3.1* 2.9*  CL 109 110 119*  CO2 23 24 28   BUN 77* 82* 83*  CREATININE 3.84* 3.52* 2.81*  GLUCOSE 294* 319* 291*   Electrolytes  Recent Labs Lab 12/19/14 2105  12/20/14 0500 12/20/14 0927 12/20/14 1554 12/21/14 0500  CALCIUM  --   < >  6.4*  --  6.5* 7.1*  MG 2.0  --   --  2.2 2.1 2.5*  PHOS 5.1*  --   --  4.6  --  4.2  < > = values in this interval not displayed. Sepsis Markers  Recent Labs Lab 12/16/14 1459  12/17/14 2155 12/18/14 0036 12/18/14 0401 12/20/14 0928 12/21/14 0500  LATICACIDVEN 0.9  --  0.8 0.7  --   --   --   PROCALCITON  --   < >  --   --  0.86 0.32 0.30  < > = values in this interval not displayed. ABG  Recent Labs Lab 12/18/14 0751 12/18/14 1043 12/20/14 0258   PHART 7.295* 7.436 7.522*  PCO2ART 40.9 29.5* 31.5*  PO2ART 71.0* 269.0* 73.0*   Liver Enzymes  Recent Labs Lab 12/17/14 0216 12/18/14 0401 12/20/14 0500 12/21/14 0500  AST 133* 49* 22  --   ALT 98* 58 22  --   ALKPHOS 41 50 50  --   BILITOT 0.5 1.1 1.0  --   ALBUMIN 1.5* 1.6* 1.6* 1.7*   Cardiac Enzymes  Recent Labs Lab 12/20/14 0928 12/20/14 1554 12/20/14 2029  TROPONINI 4.38* 3.51* 3.72*   Glucose  Recent Labs Lab 12/20/14 1134 12/20/14 1522 12/20/14 1925 12/21/14 12/21/14 0336 12/21/14 0805  GLUCAP 287* 277* 267* 281* 264* 248*    Imaging Dg Abd Portable 1v  12/21/2014  CLINICAL DATA:  Orogastric tube removal.  Feeding tube adjustment. EXAM: PORTABLE ABDOMEN - 1 VIEW COMPARISON:  Yesterday FINDINGS: Feeding tube tip is at the level of the fourth portion duodenum. The nasogastric tube is been removed. No dilated bowel in the visualized abdomen. Bibasilar lung opacity, better evaluated on chest x-ray from yesterday. IMPRESSION: Feeding tube tip at the distal duodenum. Electronically Signed   By: Marnee Spring M.D.   On: 12/21/2014 13:59    ASSESSMENT / PLAN: PULMONARY A: Acute Hypoxic Respiratory Failure - S/P Reintubation. Pulmonary edema.  P:  Holding on further diuresis Repeat SBT in AM 11/30 Full Vent support overnight  CARDIOVASCULAR A: Septic Shock - Shock resolved. Demand Ischemia vs NSTEMI - Wall motion abnormality on TTE. Obtaining prior records from OSH. Systolic CHF - EF 30-35%  P:  Cardiology following Awaiting outside  Holding further diuresis ASA 325 mg daily Lipitor  qhs Coreg 6.25mg  bid Monitoring on telemetry  RENAL A: Acute Renal Failure - Improving. Hypokalemia - Replacing IV. Hypernatremia - 6L Free water deficit. Metabolic/Contraction Alkalosis - Secondary to diuresis.  P:  KVO D/C Lasix Free water 200 mL by the tube every 4 hours D/C foley Trending renal function with BUN/Creatinine  daily Monitoring UOP with condom catheter Monitoring electrolytes every 12 hours  GASTROINTESTINAL A: Severe pancreatitis - At risk for necrosis & hemorrhagic conversion.  P:  S/P post-pyloric DHT placement Continuing tube feeds  Free water via the tube every 4 hours Protonix via tube daily  HEMATOLOGIC A: Anemia - Mild. No active bleeding but high risk of hemorrhagic conversion in pancreas.  P:  Trending hemoglobin daily with CBC Heparin subcutaneous every 8 hours SCDs  INFECTIOUS A: Severe Acute Pancreatitis - No perforation, abscess or pseudocyst on CT  Septic Shock - Shock resolved.  P:  Day #6/7 of Primaxin. Plan to repeat culture for any fever  ENDOCRINE A: DM Type 2 - BG still suboptimally controlled. Prior DKA - Resolved.  P:  Increase NPH to 50 units subcutaneous every 12 hours Continue Accu-Cheks every 4 hours Sliding-scale insulin per hour  rhythm  NEUROLOGIC A: Toxic metabolic encephalopathy - Resolved.  P:  Fentanyl gtt Fentanyl IV prn Versed IV prn  FAMILY  - Updates: No family present at this morning for update.  - Inter-disciplinary family meet or Palliative Care meeting due by: 12/22/14   TODAY'S SUMMARY:  54 year old male with acute renal failure and severe pancreatitis. Appreciate input from cardiology regarding wall motion abnormalities on echocardiogram. Discontinuing diuresis with Lasix given hypernatremia. Adding back some free water for proximally 6 L free water deficit. Patient had borderline spontaneous breathing trial this morning and given his recent extubation and reintubation will attempt spontaneous breathing trial again in the morning.  I have spent a total of 33 minutes of critical critical care time caring for the patient & reviewing the patient's electronic medical record.  Donna Christen Jamison Neighbor, M.D. [facility] Pulmonary & Critical Care Pager:  [phone] After 3pm or if no response, call [phone] 12/21/2014,  3:44 PM

## 2014-12-21 NOTE — Progress Notes (Signed)
CRITICAL VALUE ALERT  Critical value received: Sodium 164   Date of notification:  12/21/14  Time of notification:  16:35  Critical value read back:Yes.    Nurse who received alert:  Arnoldo LenisAlex Freman Lapage RN   MD notified (1st page):  Dr. Arsenio LoaderSommer  Time of first page:  17:30  Responding MD:  Dr. Arsenio LoaderSommer  Time MD responded:  17:30

## 2014-12-21 NOTE — Progress Notes (Signed)
eLink Physician-Brief Progress Note Patient Name: Brent ShamesDaniel J Debono DOB: [birth year] MRN: [id number]   Date of Service  12/21/2014  HPI/Events of Note  Multiple issues: 1. K+ = 3.4 and Creatinine = 2.4 and 2. Na+ = 159 >> 164.  eICU Interventions  Will increase 0.45 NaCl IV infusion to 75 mL/hour.     Intervention Category Major Interventions: Electrolyte abnormality - evaluation and management Intermediate Interventions: Electrolyte abnormality - evaluation and management  Sommer,Steven Eugene 12/21/2014, 5:35 PM

## 2014-12-21 NOTE — Progress Notes (Signed)
eLink Physician-Brief Progress Note Patient Name: Brent ShamesDaniel J Toole DOB: [birth year] MRN: [id number]   Date of Service  12/21/2014  HPI/Events of Note    eICU Interventions  Hypokalemia -repleted      Intervention Category Intermediate Interventions: Electrolyte abnormality - evaluation and management  Fermina Mishkin V. 12/21/2014, 6:05 AM

## 2014-12-22 ENCOUNTER — Inpatient Hospital Stay (HOSPITAL_COMMUNITY): Payer: BLUE CROSS/BLUE SHIELD

## 2014-12-22 LAB — RENAL FUNCTION PANEL
ALBUMIN: 1.6 g/dL — AB (ref 3.5–5.0)
ANION GAP: 9 (ref 5–15)
BUN: 74 mg/dL — ABNORMAL HIGH (ref 6–20)
CALCIUM: 7.4 mg/dL — AB (ref 8.9–10.3)
CO2: 27 mmol/L (ref 22–32)
Chloride: 125 mmol/L — ABNORMAL HIGH (ref 101–111)
Creatinine, Ser: 2.13 mg/dL — ABNORMAL HIGH (ref 0.61–1.24)
GFR, EST AFRICAN AMERICAN: 39 mL/min — AB (ref >60)
GFR, EST NON AFRICAN AMERICAN: 33 mL/min — AB (ref >60)
Glucose, Bld: 277 mg/dL — ABNORMAL HIGH (ref 65–99)
PHOSPHORUS: 3.5 mg/dL (ref 2.5–4.6)
Potassium: 3.9 mmol/L (ref 3.5–5.1)
SODIUM: 161 mmol/L — AB (ref 135–145)

## 2014-12-22 LAB — BASIC METABOLIC PANEL
Anion gap: 8 (ref 5–15)
BUN: 67 mg/dL — AB (ref 6–20)
CALCIUM: 7.8 mg/dL — AB (ref 8.9–10.3)
CO2: 28 mmol/L (ref 22–32)
CREATININE: 1.98 mg/dL — AB (ref 0.61–1.24)
Chloride: 128 mmol/L — ABNORMAL HIGH (ref 101–111)
GFR calc non Af Amer: 37 mL/min — ABNORMAL LOW (ref >60)
GFR, EST AFRICAN AMERICAN: 42 mL/min — AB (ref >60)
Glucose, Bld: 196 mg/dL — ABNORMAL HIGH (ref 65–99)
Potassium: 3.7 mmol/L (ref 3.5–5.1)
SODIUM: 164 mmol/L — AB (ref 135–145)

## 2014-12-22 LAB — CBC WITH DIFFERENTIAL/PLATELET
BASOS ABS: 0 10*3/uL (ref 0.0–0.1)
BASOS PCT: 0 %
EOS ABS: 0.2 10*3/uL (ref 0.0–0.7)
Eosinophils Relative: 1 %
HCT: 36.4 % — ABNORMAL LOW (ref 39.0–52.0)
HEMOGLOBIN: 11.3 g/dL — AB (ref 13.0–17.0)
LYMPHS ABS: 0.8 10*3/uL (ref 0.7–4.0)
Lymphocytes Relative: 5 %
MCH: 28.8 pg (ref 26.0–34.0)
MCHC: 31 g/dL (ref 30.0–36.0)
MCV: 92.6 fL (ref 78.0–100.0)
Monocytes Absolute: 0.3 10*3/uL (ref 0.1–1.0)
Monocytes Relative: 2 %
NEUTROS PCT: 92 %
Neutro Abs: 14 10*3/uL — ABNORMAL HIGH (ref 1.7–7.7)
Platelets: 173 10*3/uL (ref 150–400)
RBC: 3.93 MIL/uL — AB (ref 4.22–5.81)
RDW: 16.4 % — ABNORMAL HIGH (ref 11.5–15.5)
WBC: 15.3 10*3/uL — AB (ref 4.0–10.5)

## 2014-12-22 LAB — GLUCOSE, CAPILLARY
GLUCOSE-CAPILLARY: 220 mg/dL — AB (ref 65–99)
GLUCOSE-CAPILLARY: 229 mg/dL — AB (ref 65–99)
GLUCOSE-CAPILLARY: 294 mg/dL — AB (ref 65–99)
Glucose-Capillary: 191 mg/dL — ABNORMAL HIGH (ref 65–99)
Glucose-Capillary: 229 mg/dL — ABNORMAL HIGH (ref 65–99)
Glucose-Capillary: 213 mg/dL — ABNORMAL HIGH (ref 65–99)
Glucose-Capillary: 175 mg/dL — ABNORMAL HIGH (ref 65–99)
Glucose-Capillary: 146 mg/dL — ABNORMAL HIGH (ref 65–99)

## 2014-12-22 LAB — MAGNESIUM
MAGNESIUM: 2.2 mg/dL (ref 1.7–2.4)
Magnesium: 2.4 mg/dL (ref 1.7–2.4)

## 2014-12-22 LAB — PROCALCITONIN: PROCALCITONIN: 0.34 ng/mL

## 2014-12-22 MED ORDER — WHITE PETROLATUM GEL
Status: AC
  Administered 2014-12-22: 0.2
  Filled 2014-12-22: qty 1

## 2014-12-22 MED ORDER — FENTANYL CITRATE (PF) 2500 MCG/50ML IJ SOLN
25.0000 ug/h | INTRAMUSCULAR | Status: DC
  Filled 2014-12-22 (×2): qty 100

## 2014-12-22 MED ORDER — INSULIN NPH (HUMAN) (ISOPHANE) 100 UNIT/ML ~~LOC~~ SUSP
40.0000 [IU] | Freq: Four times a day (QID) | SUBCUTANEOUS | Status: DC
  Administered 2014-12-22 – 2014-12-24 (×7): 40 [IU] via SUBCUTANEOUS
  Filled 2014-12-22 (×3): qty 10

## 2014-12-22 MED ORDER — FENTANYL CITRATE (PF) 100 MCG/2ML IJ SOLN: 100.0000 ug | INTRAMUSCULAR | Status: DC | PRN

## 2014-12-22 MED ORDER — FENTANYL BOLUS VIA INFUSION
100.0000 ug | INTRAVENOUS | Status: DC | PRN
  Filled 2014-12-22: qty 100

## 2014-12-22 NOTE — Procedures (Signed)
Extubation Procedure Note  Patient Details:   Name: Brent Rasmussen DOB: 11-26-1960 MRN: [id number]   Airway Documentation:     Evaluation  O2 sats: stable throughout Complications: No apparent complications Patient did tolerate procedure well. Bilateral Breath Sounds: Clear Suctioning: Airway Yes   Patient extubated to 2L nasal cannula per MD order.  Positive cuff leak noted.  No evidence of stridor.  Patient able to speak post extubation.  Sats currently 95%.  Vitals are stable.  No apparent complications.  Ledell NossBrown, Chyane Greer N 12/22/2014, 12:15 PM

## 2014-12-22 NOTE — Progress Notes (Signed)
CRITICAL VALUE ALERT  Critical value received:  Na 164  Date of notification:  11/30  Time of notification:  1800  Critical value read back:Yes.    Nurse who received alert:  Acey LavPhyllis Reynold Mantell  MD notified (1st page):  Harlin HeysElink Kristin RN For Elinkl md  Time of first page:  1845  MD notified (2nd page):  Time of second page:  Responding MD:  Pola CornElink  Time MD responded:  1845 talked with Baxter HireKristen RN for mD

## 2014-12-22 NOTE — Progress Notes (Signed)
Received call from Acey LavPhyllis McKinney RN reguarding critical lab value Na 164 and notified eMD Dr Arsenio LoaderSommer.

## 2014-12-22 NOTE — Progress Notes (Signed)
eLink Physician-Brief Progress Note Patient Name: Brent ShamesDaniel J Kitagawa DOB: 19-Sep-1960 MRN: [id number]   Date of Service  12/22/2014  HPI/Events of Note  Na+ = 161 >> 164.  eICU Interventions  Increase 0.45 NaCl IV infusion from 75 mL to 125 mL/hour.     Intervention Category Major Interventions: Electrolyte abnormality - evaluation and management  [name] 12/22/2014, 6:57 PM

## 2014-12-22 NOTE — Progress Notes (Addendum)
PULMONARY / CRITICAL CARE MEDICINE   Name: Brent Rasmussen MRN: [id number] DOB: 1960/10/31    ADMISSION DATE:  12/14/2014 CONSULTATION DATE:  12/14/14  REFERRING MD :  [facility]  CHIEF COMPLAINT:  DKA, pancreatitis  INITIAL PRESENTATION: Brent Rasmussen is a 54 y.o. male that presented to [facility] for abdominal pain. He was found to have pancreatitis and be in DKA. There he was provided 3.5 L bolus. He was started on an insulin drip and was transferred to [facility] for care.  STUDIES:  11/22 MRI brain: No acute processes 11/22 CT abdomen: Moderate to severe acute pancreatitis without fluid collection, abscess or pseudocyst. 11/25 repeat CT: Worsening diffuse pancreatitis, no abscess, no cysts 11/28 TTE:  EF 30-35% w/ severe hypokinesis & anterior wall motion abnormality. 11/29 KUB:  DHT tip in distal duodenum  SIGNIFICANT EVENTS: 11/22 -Transferred to [facility] from [facility] 11/23- BM vasal vagals, cpr 2 min , ETT placed, on presors 11/24- aline placed, major improved pressors, volume given 11/25- extubated, escallating abdo pain 11/26- resp failure, re intubated 11/26- concern NSTEMI, heparin started 11/27- neg 6.4 liters 11/30- extubated  Cultures: BCx2 11/22:  Negative  Antibiotics: Imipenem 11/24>>>  Lines: ETT: 11/23 - 11/25, 11/26 - 11/30 OGT: 11/26 - 11/29 DHT:  11/28>>  SUBJECTIVE: No acute events overnight.  Patient reported pain as 5/10 this morning. Continuing to have abdominal pain. Denies any chest pain. Denies any difficulty breathing off of the ventilator on nasal cannula oxygen.  ROS:  Denies any nausea.  VITAL SIGNS: Temp:  [98.2 F (36.8 C)-99.1 F (37.3 C)] 98.7 F (37.1 C) (11/30 1558) Pulse Rate:  [83-109] 92 (11/30 1214) Resp:  [15-31] 22 (11/30 1200) BP: (103-162)/(61-73) 129/71 mmHg (11/30 1214) SpO2:  [93 %-99 %] 93 % (11/30 1435) Arterial Line BP: (101-168)/(44-75) 130/67 mmHg (11/30 1200) FiO2 (%):  [40 %] 40 %  (11/30 1126) Weight:  [228 lb 6.3 oz (103.6 kg)] 228 lb 6.3 oz (103.6 kg) (11/30 0449) HEMODYNAMICS: CVP:  [2 mmHg] 2 mmHg VENTILATOR SETTINGS: Vent Mode:  [-] PSV;CPAP FiO2 (%):  [40 %] 40 % Set Rate:  [16 bmp] 16 bmp Vt Set:  [580 mL] 580 mL PEEP:  [5 cmH20] 5 cmH20 Pressure Support:  [0 cmH20-5 cmH20] 0 cmH20 Plateau Pressure:  [20 cmH20] 20 cmH20 INTAKE / OUTPUT:  Intake/Output Summary (Last 24 hours) at 12/22/14 1632 Last data filed at 12/22/14 1300  Gross per 24 hour  Intake 3626.45 ml  Output   1750 ml  Net 1876.45 ml    PHYSICAL EXAMINATION: General:  Awake.  No distress postextubation. Appears comfortable. Integument:  Warm & dry. No rash on exposed skin.  HEENT:  PERRL. No scleral icterus or injection. Cardiovascular:  Regular rate. No appreciable JVD.  No edema. Pulmonary:   Clear bilaterally to auscultation. Symmetric chest wall rise on ventilator. Normal work of breathing on nasal cannula oxygen now. Abdomen: Soft. Normal bowel sounds. Protuberant. Neurological:  Following commands. Moving all 4 extremities. Writing notes appropriately.  LABS:  CBC  Recent Labs Lab 12/20/14 0500 12/21/14 0500 12/22/14 0414  WBC 8.4 10.0 15.3*  HGB 11.5* 11.7* 11.3*  HCT 33.3* 35.8* 36.4*  PLT 142* 158 173   Coag's No results for input(s): APTT, INR in the last 168 hours. BMET  Recent Labs Lab 12/21/14 0500 12/21/14 1634 12/22/14 0414  NA 159* 164* 161*  K 2.9* 3.4* 3.9  CL 119* 125* 125*  CO2 28 28 27  BUN 83* 73* 74*  CREATININE 2.81* 2.40* 2.13*  GLUCOSE 291* 229* 277*   Electrolytes  Recent Labs Lab 12/20/14 0927  12/21/14 0500 12/21/14 1634 12/22/14 0414  CALCIUM  --   < > 7.1* 7.6* 7.4*  MG 2.2  < > 2.5* 2.5* 2.2  PHOS 4.6  --  4.2  --  3.5  < > = values in this interval not displayed. Sepsis Markers  Recent Labs Lab 12/16/14 1459  12/17/14 2155 12/18/14 0036  12/20/14 0928 12/21/14 0500 12/22/14 0414  LATICACIDVEN 0.9  --  0.8 0.7   --   --   --   --   PROCALCITON  --   < >  --   --   < > 0.32 0.30 0.34  < > = values in this interval not displayed. ABG  Recent Labs Lab 12/18/14 0751 12/18/14 1043 12/20/14 0258  PHART 7.295* 7.436 7.522*  PCO2ART 40.9 29.5* 31.5*  PO2ART 71.0* 269.0* 73.0*   Liver Enzymes  Recent Labs Lab 12/17/14 0216 12/18/14 0401 12/20/14 0500 12/21/14 0500 12/22/14 0414  AST 133* 49* 22  --   --   ALT 98* 58 22  --   --   ALKPHOS 41 50 50  --   --   BILITOT 0.5 1.1 1.0  --   --   ALBUMIN 1.5* 1.6* 1.6* 1.7* 1.6*   Cardiac Enzymes  Recent Labs Lab 12/20/14 0928 12/20/14 1554 12/20/14 2029  TROPONINI 4.38* 3.51* 3.72*   Glucose  Recent Labs Lab 12/21/14 1934 12/21/14 2359 12/22/14 0349 12/22/14 0758 12/22/14 1120 12/22/14 1557  GLUCAP 256* 220* 229* 213* 229* 175*    Imaging No results found.  ASSESSMENT / PLAN: PULMONARY A: Acute Hypoxic Respiratory Failure - Extubated. Secondary to pulmonary edema.  P:  Holding on further diuresis Incentive spirometer Albuterol nebs q6hr Wean FiO2 for Sat >94%  CARDIOVASCULAR A: Septic Shock - Shock resolved. Demand Ischemia vs NSTEMI - Wall motion abnormality on TTE. Obtaining prior records from OSH. Systolic CHF - EF 30-35%  P:  Cardiology following Awaiting outside  Holding further diuresis ASA 325 mg daily Lipitor  qhs Coreg 6.25mg  bid Monitoring on telemetry  RENAL A: Acute Renal Failure - Improving. Hypokalemia - Resolved. Hypernatremia - Improving. 7L Free water deficit. Metabolic Acidosis - Secondary to hyperchloremia. Metabolic/Contraction Alkalosis - Secondary to diuresis.  P:  KVO Holding diuresis Free water 200 mL by the tube every 4 hours 1/2 NS @ 75cc/hr Trending renal function with BUN/Creatinine  Monitoring UOP with condom catheter Monitoring electrolytes every 12 hours  GASTROINTESTINAL A: Severe pancreatitis - At risk for necrosis & hemorrhagic conversion.  P:   S/P post-pyloric DHT placement Re-confirming tube placement post extubation with KUB Continuing tube feeds  Free water via the tube every 4 hours Protonix via tube daily  HEMATOLOGIC A: Anemia - Mild. No active bleeding but high risk of hemorrhagic conversion in pancreas.  P:  Trending hemoglobin daily with CBC Heparin subcutaneous every 8 hours SCDs  INFECTIOUS A: Severe Acute Pancreatitis - No perforation, abscess or pseudocyst on CT  Septic Shock - Shock resolved.  P:  Day #7/7 of Primaxin. Plan to repeat culture for any fever  ENDOCRINE A: DM Type 2 - BG still uncontrolled. Prior DKA - Resolved.  P:  NPH to 40 units subcutaneous every 6 hours Continue Accu-Cheks every 4 hours Sliding-scale insulin per hour rhythm  NEUROLOGIC A: Toxic metabolic encephalopathy - Resolved. Abdominal Pain - Secondary to pancreatitis.  P:  Fentanyl gtt Fentanyl IV prn D/C Versed  FAMILY  - Updates: Wife updated by Dr. Jamison NeighborNestor this morning.  TODAY'S SUMMARY:  54 year old male with acute renal failure and severe pancreatitis. Appreciate input from cardiology regarding wall motion abnormalities on echocardiogram.  Still awaiting outside records Re: Prior left ventricular function. Patient successfully extubated this morning after passing spontaneous breathing trial. Continuing on fentanyl infusion for pain control. Continuing on tube feeds with repeat KUB to confirm tube placement post extubation.  I have spent a total of 37 minutes of critical critical care time caring for the patient, updating his wife at bedside, & reviewing the patient's electronic medical record.  Donna ChristenJennings E. Jamison NeighborNestor, M.D. [facility] Pulmonary & Critical Care Pager:  [phone] After 3pm or if no response, call [phone] 12/22/2014, 4:32 PM

## 2014-12-22 NOTE — Progress Notes (Signed)
Patient Name: Brent Rasmussen Date of Encounter: 12/22/2014     Active Problems:   DKA (diabetic ketoacidoses) (HCC)   Pancreatitis   Acute respiratory failure with hypoxia (HCC)   Pancreatitis, acute   Encounter for intubation   Encounter for orogastric (OG) tube placement   Encounter for nasogastric (NG) tube placement   NSVT (nonsustained ventricular tachycardia) (HCC)   Elevated troponin   Hypokalemia   Acute renal failure (HCC)   Acute systolic CHF (congestive heart failure) (HCC)    SUBJECTIVE  Still intubated. Breathing trial in progress. Appears very comfortable. No chest pain or SOB.    CURRENT MEDS . albuterol  2.5 mg Nebulization Q6H  . antiseptic oral rinse  7 mL Mouth Rinse QID  . aspirin  325 mg Oral Daily  . atorvastatin  40 mg Oral q1800  . carvedilol  6.25 mg Oral BID WC  . chlorhexidine gluconate  15 mL Mouth Rinse BID  . feeding supplement (PRO-STAT SUGAR FREE 64)  30 mL Per Tube QID  . free water  200 mL Per Tube Q4H  . heparin subcutaneous  5,000 Units Subcutaneous 3 times per day  . imipenem-cilastatin  250 mg Intravenous 4 times per day  . insulin aspart  0-20 Units Subcutaneous 6 times per day  . insulin NPH Human  40 Units Subcutaneous 4 times per day  . pantoprazole sodium  40 mg Per Tube Daily    OBJECTIVE  Filed Vitals:   12/22/14 0700 12/22/14 0747 12/22/14 0800 12/22/14 0900  BP:      Pulse: 90 96 100 91  Temp:   99.1 F (37.3 C)   TempSrc:   Oral   Resp: Height:      Weight:      SpO2: 95% 95% 97% 97%    Intake/Output Summary (Last 24 hours) at 12/22/14 1010 Last data filed at 12/22/14 0900  Gross per 24 hour  Intake 3853.45 ml  Output   1800 ml  Net 2053.45 ml   Filed Weights   12/20/14 0415 12/21/14 0500 12/22/14 0449  Weight: 109.1 kg (240 lb 8.4 oz) 102.2 kg (225 lb 5 oz) 103.6 kg (228 lb 6.3 oz)    PHYSICAL EXAM  General: Pleasant, NAD. ET tube in place. Neuro: Alert and oriented X 3. Moves all  extremities spontaneously. Psych: Normal affect. HEENT:  Normal  Neck: Supple without bruits or JVD. Lungs:  Resp regular and unlabored, CTA. Heart: RRR no s3, s4, or murmurs. Abdomen: Soft, non-tender, non-distended, BS + x 4.  Extremities: No clubbing, cyanosis or edema. DP/PT/Radials 2+ and equal bilaterally.  Accessory Clinical Findings  CBC  Recent Labs  12/21/14 0500 12/22/14 0414  WBC 10.0 15.3*  NEUTROABS 8.7* 14.0*  HGB 11.7* 11.3*  HCT 35.8* 36.4*  MCV 88.6 92.6  PLT 158 173   Basic Metabolic Panel  Recent Labs  12/21/14 0500 12/21/14 1634 12/22/14 0414  NA 159* 164* 161*  K 2.9* 3.4* 3.9  CL 119* 125* 125*  CO2 GLUCOSE 291* 229* 277*  BUN 83* 73* 74*  CREATININE 2.81* 2.40* 2.13*  CALCIUM 7.1* 7.6* 7.4*  MG 2.5* 2.5* 2.2  PHOS 4.2  --  3.5   Liver Function Tests  Recent Labs  12/20/14 0500 12/21/14 0500 12/22/14 0414  AST 22  --   --   ALT 22  --   --   ALKPHOS 50  --   --  BILITOT 1.0  --   --   PROT 5.1*  --   --   ALBUMIN 1.6* 1.7* 1.6*   No results for input(s): LIPASE, AMYLASE in the last 72 hours. Cardiac Enzymes  Recent Labs  12/20/14 0928 12/20/14 1554 12/20/14 2029  TROPONINI 4.38* 3.51* 3.72*    TELE  NSR   Radiology/Studies   2D ECHO: 12/20/2014 LV EF: 30 - 35% Study Conclusions - Left ventricle: The cavity size was normal. Wall thickness was   normal. Systolic function was moderately to severely reduced. The   estimated ejection fraction was in the range of 30% to 35%.   Severe hypokinesis of the mid-apicalanteroseptal, anterior, and   apical myocardium. Doppler parameters are consistent with   abnormal left ventricular relaxation (grade 1 diastolic   dysfunction). - Atrial septum: No defect or patent foramen ovale was identified.  ASSESSMENT AND PLAN 54 yo man with PMH of T2DM, CAD (prior stent 5-10 years ago at [facility]) and asthma who developed abdominal pain with associated nausea/vomiting a  week ago Saturday(11/19) with progressive pain that led him to seek urgent care at [facility] where he was diagnosed with acute pancreatitis with hypertriglyceridemia and DKA leading to transfer [facility]. He has been treated in the ICU with critical illness.   Elevated troponin. Suspect related to demand ischemia is setting of critical illness. No serial Ecg changes. 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   -- 2D ECHO with EF 30-35% and severe HK of the mid-apicalanteroseptal, anterior, and apical myocardium. G1DD. No previous ECHO to compare. Possibly new WMA and new low EF? Patient denies hx of CHF  -- Continue ASA and beta blocker. Consider increasing Coreg tomorrow if BP stable.  On high dose statin. -- He has presumed newly reduced EF with WMA, old MI by ECG, positive cardiac enzymes and chest pain. This clinical situation if worrisome for ischemic etiology; however, he is not a candidate for invasive cardiac evaluation at this time with ARF. We will continue to treat medically for now. --still waiting [facility] old stent.  -- will need ischemic evaluation once renal function recovered.  Acute systolic CHF - EF 30-35% and severe HK of the mid-apicalanteroseptal, anterior, and apical myocardium. G1DD. No previous ECHO to compare. Possibly new WMA and newly reduced EF. Lungs are clear. -- He was on IV lasix but this has been discontinued. He had vigorous diuresis. He is up 1 L in I/Os. He has be started on free water for hypernatremia.  -- Continue BB. Consider adding hydralazine + nitrates as he is not a candidate for ACE/ARB with ARF when BPs stable  Hypernatremia- continue free water  NSVT secondary to hypokalemia. No recurrence.  Acute pancreatitis- 2/2 hypertriglyceridemia per primary service  Acute respiratory failure due to SIRS.  Per PCCM. Planning extubation if spontaneous breathing trial goes well.  ARF- creat improving. 5.3--> 2.81 >>  2.13today  DM type 2 with recent DKA.  Hypokalemia/hypocalcemia- repleted  Signed,   Keontre Defino [place], MDFACC 12/22/2014 10:10 AM

## 2014-12-23 DIAGNOSIS — E870 Hyperosmolality and hypernatremia: Secondary | ICD-10-CM | POA: Diagnosis present

## 2014-12-23 LAB — CBC WITH DIFFERENTIAL/PLATELET
BASOS ABS: 0 10*3/uL (ref 0.0–0.1)
BASOS PCT: 0 %
EOS ABS: 0.1 10*3/uL (ref 0.0–0.7)
EOS PCT: 0 %
HEMATOCRIT: 35.2 % — AB (ref 39.0–52.0)
HEMOGLOBIN: 11 g/dL — AB (ref 13.0–17.0)
Lymphocytes Relative: 6 %
Lymphs Abs: 0.9 10*3/uL (ref 0.7–4.0)
MCH: 29.4 pg (ref 26.0–34.0)
MCHC: 31.3 g/dL (ref 30.0–36.0)
MCV: 94.1 fL (ref 78.0–100.0)
Monocytes Absolute: 0.1 10*3/uL (ref 0.1–1.0)
Monocytes Relative: 1 %
NEUTROS PCT: 93 %
Neutro Abs: 14.6 10*3/uL — ABNORMAL HIGH (ref 1.7–7.7)
Platelets: 202 10*3/uL (ref 150–400)
RBC: 3.74 MIL/uL — AB (ref 4.22–5.81)
RDW: 16.3 % — AB (ref 11.5–15.5)
WBC: 16.5 10*3/uL — AB (ref 4.0–10.5)

## 2014-12-23 LAB — RENAL FUNCTION PANEL
ANION GAP: 11 (ref 5–15)
Albumin: 1.6 g/dL — ABNORMAL LOW (ref 3.5–5.0)
BUN: 66 mg/dL — ABNORMAL HIGH (ref 6–20)
CALCIUM: 7.6 mg/dL — AB (ref 8.9–10.3)
CO2: 28 mmol/L (ref 22–32)
CREATININE: 2.06 mg/dL — AB (ref 0.61–1.24)
Chloride: 127 mmol/L — ABNORMAL HIGH (ref 101–111)
GFR, EST AFRICAN AMERICAN: 40 mL/min — AB (ref >60)
GFR, EST NON AFRICAN AMERICAN: 35 mL/min — AB (ref >60)
Glucose, Bld: 152 mg/dL — ABNORMAL HIGH (ref 65–99)
PHOSPHORUS: 3.4 mg/dL (ref 2.5–4.6)
Potassium: 3.1 mmol/L — ABNORMAL LOW (ref 3.5–5.1)
SODIUM: 166 mmol/L — AB (ref 135–145)

## 2014-12-23 LAB — BASIC METABOLIC PANEL
Anion gap: 8 (ref 5–15)
BUN: 60 mg/dL — ABNORMAL HIGH (ref 6–20)
CALCIUM: 7.7 mg/dL — AB (ref 8.9–10.3)
CO2: 29 mmol/L (ref 22–32)
CREATININE: 2.11 mg/dL — AB (ref 0.61–1.24)
Chloride: 129 mmol/L — ABNORMAL HIGH (ref 101–111)
GFR calc Af Amer: 39 mL/min — ABNORMAL LOW (ref >60)
GFR calc non Af Amer: 34 mL/min — ABNORMAL LOW (ref >60)
GLUCOSE: 199 mg/dL — AB (ref 65–99)
Potassium: 3.1 mmol/L — ABNORMAL LOW (ref 3.5–5.1)
Sodium: 166 mmol/L (ref 135–145)

## 2014-12-23 LAB — GLUCOSE, CAPILLARY
GLUCOSE-CAPILLARY: 162 mg/dL — AB (ref 65–99)
Glucose-Capillary: 160 mg/dL — ABNORMAL HIGH (ref 65–99)

## 2014-12-23 LAB — MAGNESIUM: MAGNESIUM: 2.2 mg/dL (ref 1.7–2.4)

## 2014-12-23 MED ORDER — ALBUTEROL SULFATE (2.5 MG/3ML) 0.083% IN NEBU
2.5000 mg | INHALATION_SOLUTION | Freq: Four times a day (QID) | RESPIRATORY_TRACT | Status: DC | PRN
  Administered 2014-12-25: 2.5 mg via RESPIRATORY_TRACT
  Filled 2014-12-23: qty 3

## 2014-12-23 MED ORDER — FENTANYL 40 MCG/ML IV SOLN
INTRAVENOUS | Status: DC
  Administered 2014-12-23: 125 ug via INTRAVENOUS
  Administered 2014-12-23: 0 ug via INTRAVENOUS
  Administered 2014-12-23: 13:00:00 via INTRAVENOUS
  Administered 2014-12-23: 275 ug via INTRAVENOUS
  Administered 2014-12-24: 75 ug via INTRAVENOUS
  Administered 2014-12-24: 0 ug via INTRAVENOUS
  Administered 2014-12-24: 03:00:00 via INTRAVENOUS
  Administered 2014-12-24: 375 ug via INTRAVENOUS
  Administered 2014-12-24: 321.3 ug via INTRAVENOUS
  Administered 2014-12-24: 275 ug via INTRAVENOUS
  Administered 2014-12-25: 25 ug via INTRAVENOUS
  Filled 2014-12-23 (×2): qty 25

## 2014-12-23 MED ORDER — FREE WATER
400.0000 mL | Status: DC
  Administered 2014-12-23 – 2014-12-25 (×9): 400 mL

## 2014-12-23 MED ORDER — SODIUM BICARBONATE 8.4 % IV SOLN
INTRAVENOUS | Status: DC
  Administered 2014-12-23 (×2): via INTRAVENOUS
  Filled 2014-12-23 (×5): qty 50

## 2014-12-23 MED ORDER — FREE WATER
300.0000 mL | Status: DC
  Administered 2014-12-23 (×3): 300 mL

## 2014-12-23 MED ORDER — POTASSIUM CHLORIDE 20 MEQ/15ML (10%) PO SOLN
60.0000 meq | Freq: Once | ORAL | Status: AC
Start: 2014-12-23 — End: 2014-12-23
  Administered 2014-12-23: 60 meq
  Filled 2014-12-23: qty 45

## 2014-12-23 MED ORDER — POTASSIUM CHLORIDE 20 MEQ/15ML (10%) PO SOLN
40.0000 meq | Freq: Once | ORAL | Status: AC
  Administered 2014-12-23: 40 meq
  Filled 2014-12-23: qty 30

## 2014-12-23 MED ORDER — SODIUM CHLORIDE 0.9 % IJ SOLN: 9.0000 mL | INTRAMUSCULAR | Status: DC | PRN

## 2014-12-23 MED ORDER — NALOXONE HCL 0.4 MG/ML IJ SOLN: 0.4000 mg | INTRAMUSCULAR | Status: DC | PRN

## 2014-12-23 MED ORDER — DEXTROSE-NACL 5-0.2 % IV SOLN
INTRAVENOUS | Status: DC
  Administered 2014-12-23: 125 mL/h via INTRAVENOUS
  Administered 2014-12-23 (×2): via INTRAVENOUS

## 2014-12-23 MED ORDER — ZOLPIDEM TARTRATE 5 MG PO TABS
5.0000 mg | ORAL_TABLET | Freq: Once | ORAL | Status: AC
Start: 2014-12-23 — End: 2014-12-23
  Administered 2014-12-23: 5 mg via ORAL
  Filled 2014-12-23: qty 1

## 2014-12-23 MED ORDER — ONDANSETRON HCL 4 MG/2ML IJ SOLN: 4.0000 mg | Freq: Four times a day (QID) | INTRAMUSCULAR | Status: DC | PRN

## 2014-12-23 MED ORDER — POTASSIUM CHLORIDE 20 MEQ/15ML (10%) PO SOLN
40.0000 meq | Freq: Once | ORAL | Status: DC
  Filled 2014-12-23: qty 30

## 2014-12-23 NOTE — Progress Notes (Signed)
CRITICAL VALUE ALERT  Critical value received:  Na 166   Date of notification:  12/23/2014  Time of notification:  06:00  Critical value read back:Yes.    Nurse who received alert:  BShepherd, RN  MD notified (1st page):  Deterding, E.  Time of first page:  06:02  MD notified (2nd page):  Time of second page:  Responding MD:  Deterding, E.  Time MD responded:  06:02

## 2014-12-23 NOTE — Progress Notes (Signed)
PULMONARY / CRITICAL CARE MEDICINE   Name: Brent Rasmussen MRN: [id number] DOB: 18-Jun-1960    ADMISSION DATE:  12/14/2014 CONSULTATION DATE:  12/14/14  REFERRING MD :  [facility]  CHIEF COMPLAINT:  DKA, pancreatitis  INITIAL PRESENTATION: Brent Rasmussen is a 54 y.o. male that presented to [facility] for abdominal pain. He was found to have pancreatitis and be in DKA. There he was provided 3.5 L bolus. He was started on an insulin drip and was transferred to [facility] for care.  STUDIES:  11/22 MRI brain: No acute processes 11/22 CT abdomen: Moderate to severe acute pancreatitis without fluid collection, abscess or pseudocyst. 11/25 repeat CT: Worsening diffuse pancreatitis, no abscess, no cysts 11/28 TTE:  EF 30-35% w/ severe hypokinesis & anterior wall motion abnormality. 11/29 KUB:  DHT tip in distal duodenum  SIGNIFICANT EVENTS: 11/22 -Transferred to [facility] from [facility] 11/23- BM vasal vagals, cpr 2 min , ETT placed, on presors 11/24- aline placed, major improved pressors, volume given 11/25- extubated, escallating abdo pain 11/26- resp failure, re intubated 11/26- concern NSTEMI, heparin started 11/27- neg 6.4 liters 11/30- extubated 12/01- radial arterial line removed  Cultures: BCx2 11/22:  Negative  Antibiotics: Imipenem 11/24 - 11/30  Lines: Radial Art Line:  Removed 12/1 ETT: 11/23 - 11/25, 11/26 - 11/30 OGT: 11/26 - 11/29 DHT:  11/28>>  SUBJECTIVE: No events overnight. Tolerated extubation. Denies any dyspnea or cough. Having slight/shallow breathing. No chest pain or pressure. Patient reports pain is 5/10 today on fentanyl at 66mcg/hr.  ROS:  No nausea or emesis. Having bowel movements. No fever, chills, or sweats.  VITAL SIGNS: Temp:  [99.5 F (37.5 C)-99.9 F (37.7 C)] 99.8 F (37.7 C) (12/01 1208) Pulse Rate:  [79-102] 88 (12/01 1600) Resp:  [15-37] 29 (12/01 1600) BP: (68-113)/(49-82) 105/64 mmHg (12/01 1600) SpO2:  [90  %-96 %] 93 % (12/01 1600) Arterial Line BP: (62-157)/(49-103) 157/103 mmHg (12/01 1100) Weight:  [226 lb 3.1 oz (102.6 kg)] 226 lb 3.1 oz (102.6 kg) (12/01 0400) HEMODYNAMICS:   VENTILATOR SETTINGS:   INTAKE / OUTPUT:  Intake/Output Summary (Last 24 hours) at 12/23/14 1615 Last data filed at 12/23/14 1600  Gross per 24 hour  Intake   4277 ml  Output   2075 ml  Net   2202 ml    PHYSICAL EXAMINATION: General:  Awake.  No distress postextubation. Appears comfortable. Integument:  Warm & dry. No rash on exposed skin.  HEENT:  PERRL. No scleral icterus or injection. Cardiovascular:  Regular rate. No appreciable JVD.  No edema. Pulmonary:   Clear bilaterally to auscultation. Symmetric chest wall rise on ventilator. Normal work of breathing on nasal cannula oxygen now. Abdomen: Soft. Normal bowel sounds. Protuberant. Neurological:  Following commands. Moving all 4 extremities. Writing notes appropriately.  LABS:  CBC  Recent Labs Lab 12/21/14 0500 12/22/14 0414 12/23/14 0423  WBC 10.0 15.3* 16.5*  HGB 11.7* 11.3* 11.0*  HCT 35.8* 36.4* 35.2*  PLT 158 173 202   Coag's No results for input(s): APTT, INR in the last 168 hours. BMET  Recent Labs Lab 12/22/14 1702 12/23/14 0423 12/23/14 1400  NA 164* 166* 166*  K 3.7 3.1* 3.1*  CL 128* 127* 129*  CO2 BUN 67* 66* 60*  CREATININE 1.98* 2.06* 2.11*  GLUCOSE 196* 152* 199*   Electrolytes  Recent Labs Lab 12/21/14 0500  12/22/14 0414 12/22/14 1702 12/23/14 0423 12/23/14 1400  CALCIUM 7.1*  < >  7.4* 7.8* 7.6* 7.7*  MG 2.5*  < > 2.2 2.4 2.2  --   PHOS 4.2  --  3.5  --  3.4  --   < > = values in this interval not displayed. Sepsis Markers  Recent Labs Lab 12/17/14 2155 12/18/14 0036  12/20/14 0928 12/21/14 0500 12/22/14 0414  LATICACIDVEN 0.8 0.7  --   --   --   --   PROCALCITON  --   --   < > 0.32 0.30 0.34  < > = values in this interval not displayed. ABG  Recent Labs Lab 12/18/14 0751  12/18/14 1043 12/20/14 0258  PHART 7.295* 7.436 7.522*  PCO2ART 40.9 29.5* 31.5*  PO2ART 71.0* 269.0* 73.0*   Liver Enzymes  Recent Labs Lab 12/17/14 0216 12/18/14 0401 12/20/14 0500 12/21/14 0500 12/22/14 0414 12/23/14 0423  AST 133* 49* 22  --   --   --   ALT 98* 58 22  --   --   --   ALKPHOS 41 50 50  --   --   --   BILITOT 0.5 1.1 1.0  --   --   --   ALBUMIN 1.5* 1.6* 1.6* 1.7* 1.6* 1.6*   Cardiac Enzymes  Recent Labs Lab 12/20/14 0928 12/20/14 1554 12/20/14 2029  TROPONINI 4.38* 3.51* 3.72*   Glucose  Recent Labs Lab 12/22/14 0349 12/22/14 0758 12/22/14 1120 12/22/14 1557 12/22/14 1948 12/23/14 1541  GLUCAP 229* 213* 229* 175* 146* 162*    Imaging Dg Abd 1 View  12/22/2014  CLINICAL DATA:  Feeding tube placement EXAM: ABDOMEN - 1 VIEW COMPARISON:  12/21/2014 FINDINGS: Feeding tube tip is in the distal duodenum as before. Mild gas distention of small bowel with air present in the colon distally. Monitor leads overlie the chest. No acute osseous finding or abnormal calcification. IMPRESSION: Feeding tube tip distal duodenum. Electronically Signed   By: Judie Petit.  Shick M.D.   On: 12/22/2014 17:27    ASSESSMENT / PLAN: PULMONARY A: Acute Hypoxic Respiratory Failure - Extubated. Secondary to pulmonary edema.  P:  Holding on further diuresis Incentive spirometer Albuterol nebs q6hr Wean FiO2 for Sat >94%  CARDIOVASCULAR A: Septic Shock - Shock resolved. Demand Ischemia vs NSTEMI - Wall motion abnormality on TTE. Obtaining prior records from OSH. Systolic CHF - EF 30-35%  P:  Cardiology following Awaiting outside records Holding further diuresis ASA 325 mg daily Lipitor  qhs Coreg 6.25mg  bid Monitoring on telemetry  RENAL A: Acute Renal Failure - Improving but Creatinine stable/unchanged. Hypokalemia - Mild. Replacing via tube. Hypernatremia - Stable. 9L Free water deficit. Metabolic Acidosis - Secondary to  hyperchloremia. Metabolic/Contraction Alkalosis - Resolved. Secondary to diuresis.  P:  Changing IVF to D5 w/ Bicarb at 150cc/hr Free Water 400cc via tube q4hr Holding diuresis Trending renal function with BUN/Creatinine  Monitoring UOP  Trending electrolytes every 12 hours  GASTROINTESTINAL A: Severe pancreatitis - At risk for necrosis & hemorrhagic conversion.  P:  NPO S/P post-pyloric DHT placement Continuing tube feeds with excellent tube placement Protonix via tube daily  HEMATOLOGIC A: Anemia - Mild. No active bleeding but high risk of hemorrhagic conversion in pancreas.  P:  Trending hemoglobin daily with CBC Heparin subcutaneous every 8 hours SCDs  INFECTIOUS A: Severe Acute Pancreatitis - No perforation, abscess or pseudocyst on CT  Septic Shock - Shock resolved.  P:  Completed 7 day course Primaxin. Plan to repeat culture for any fever Trending leukocytosis  ENDOCRINE A: DM Type 2 -  BG improved control. Prior DKA - Resolved.  P:  NPH to 40 units subcutaneous every 6 hours Continue Accu-Cheks every 4 hours Sliding-scale insulin per hour rhythm  NEUROLOGIC A: Toxic metabolic encephalopathy - Resolved. Abdominal Pain - Improving. Secondary to pancreatitis.  P:  Switching Fentanyl gtt to PCA  FAMILY  - Updates: Wife updated by Dr. Jamison NeighborNestor this morning.  TODAY'S SUMMARY:  54 year old male with acute renal failure and severe pancreatitis. Appreciate input from cardiology regarding wall motion abnormalities on echocardiogram.  Still awaiting outside records. Hypernatremia is profound but no evidence of any altered mentation. Switching fluids to replace free water deficit. Continued close monitoring of respiratory and mental status post extubation. PT consulted today.  I have spent a total of 33 minutes of critical critical care time caring for the patient, updating his wife at bedside, & reviewing the patient's electronic medical  record.  Donna ChristenJennings E. Jamison NeighborNestor, M.D. [facility] Pager:  [phone] After 3pm or if no response, call [phone] 12/23/2014, 4:15 PM

## 2014-12-23 NOTE — Progress Notes (Signed)
eLink Physician-Brief Progress Note Patient Name: Brent Rasmussen DOB: [birth year] MRN: [id number]   Date of Service  12/23/2014  HPI/Events of Note  Patient requesting a sleeping aide.  eICU Interventions  Will order Ambien 5 mg PO X 1 now.      Intervention Category Minor Interventions: Routine modifications to care plan (e.g. PRN medications for pain, fever)  Brent Rasmussen,Brent Rasmussen 12/23/2014, 10:06 PM

## 2014-12-23 NOTE — Care Management Note (Signed)
Case Management Note  Patient Details  Name: Leotis ShamesDaniel J Tye MRN: [id number] Date of Birth: 14-May-1960  Subjective/Objective: Extubated yesterday.  Sitting up in chair.  Looks good. Nods head appropriately and whispers if needs to.  Lives at home with wife, independent prior. Plans to return home.  Will continue to follow progression.                   Action/Plan:   Expected Discharge Date:  12/24/14               Expected Discharge Plan:  Home/Self Care  In-House Referral:     Discharge planning Services  CM Consult  Post Acute Care Choice:    Choice offered to:     DME Arranged:    DME Agency:     HH Arranged:    HH Agency:     Status of Service:  In process, will continue to follow  Medicare Important Message Given:    Date Medicare IM Given:    Medicare IM give by:    Date Additional Medicare IM Given:    Additional Medicare Important Message give by:     If discussed at Long Length of Stay Meetings, dates discussed:    Additional Comments:  Vangie BickerBrown, Henery Betzold Jane, RN 12/23/2014, 10:53 AM

## 2014-12-23 NOTE — Progress Notes (Signed)
eLink Physician-Brief Progress Note Patient Name: Brent ShamesDaniel J Huegel DOB: 08/12/1960 MRN: [id number]   Date of Service  12/23/2014  HPI/Events of Note  Continued hypernatremia and hypokalemia  eICU Interventions  Plan: D/C 1/2 NS Change to D5 1/4 NS at 125 cc/hr Increase FW to 300 cc q4 hours Replace potassium     Intervention Category Major Interventions: Electrolyte abnormality - evaluation and management  Damien Cisar 12/23/2014, 6:31 AM

## 2014-12-23 NOTE — Progress Notes (Signed)
Patient Name: Brent ShamesDaniel J Giannattasio Date of Encounter: 12/23/2014     Active Problems:   DKA (diabetic ketoacidoses) (HCC)   Pancreatitis   Acute respiratory failure with hypoxia (HCC)   Pancreatitis, acute   Encounter for intubation   Encounter for orogastric (OG) tube placement   Encounter for nasogastric (NG) tube placement   NSVT (nonsustained ventricular tachycardia) (HCC)   Elevated troponin   Hypokalemia   Acute renal failure (HCC)   Acute systolic CHF (congestive heart failure) (HCC)    SUBJECTIVE  Extubated. Still has 5/10 abdominal pain. Throat irritation.  Mild mid chest pain constant.  CURRENT MEDS . albuterol  2.5 mg Nebulization Q6H  . antiseptic oral rinse  7 mL Mouth Rinse QID  . aspirin  325 mg Oral Daily  . atorvastatin  40 mg Oral q1800  . carvedilol  6.25 mg Oral BID WC  . chlorhexidine gluconate  15 mL Mouth Rinse BID  . feeding supplement (PRO-STAT SUGAR FREE 64)  30 mL Per Tube QID  . fentaNYL   Intravenous 6 times per day  . free water  300 mL Per Tube Q4H  . heparin subcutaneous  5,000 Units Subcutaneous 3 times per day  . insulin aspart  0-20 Units Subcutaneous 6 times per day  . insulin NPH Human  40 Units Subcutaneous 4 times per day  . pantoprazole sodium  40 mg Per Tube Daily  . potassium chloride  40 mEq Per Tube Once    OBJECTIVE  Filed Vitals:   12/23/14 0752 12/23/14 0800 12/23/14 0820 12/23/14 0900  BP:  101/64  98/59  Pulse:  87  88  Temp:   99.5 F (37.5 C)   TempSrc:   Oral   Resp:  26  19  Height:      Weight:      SpO2: 93% 95%  94%    Intake/Output Summary (Last 24 hours) at 12/23/14 1007 Last data filed at 12/23/14 0900  Gross per 24 hour  Intake 4024.5 ml  Output   2125 ml  Net 1899.5 ml   Filed Weights   12/21/14 0500 12/22/14 0449 12/23/14 0400  Weight: 102.2 kg (225 lb 5 oz) 103.6 kg (228 lb 6.3 oz) 102.6 kg (226 lb 3.1 oz)    PHYSICAL EXAM  General: Pleasant, NAD.  Neuro: Alert and oriented X 3. Moves  all extremities spontaneously. Psych: Normal affect. HEENT:  Normal  Neck: Supple without bruits or JVD. Lungs:  Resp regular and unlabored, CTA. Heart: RRR no s3, s4, or murmurs. Abdomen: Soft, non-tender, non-distended, BS + x 4.  Extremities: No clubbing, cyanosis or edema. DP/PT/Radials 2+ and equal bilaterally.  Accessory Clinical Findings  CBC  Recent Labs  12/22/14 0414 12/23/14 0423  WBC 15.3* 16.5*  NEUTROABS 14.0* 14.6*  HGB 11.3* 11.0*  HCT 36.4* 35.2*  MCV 92.6 94.1  PLT 173 202   Basic Metabolic Panel  Recent Labs  12/22/14 0414 12/22/14 1702 12/23/14 0423  NA 161* 164* 166*  K 3.9 3.7 3.1*  CL 125* 128* 127*  CO2 27 28 28   GLUCOSE 277* 196* 152*  BUN 74* 67* 66*  CREATININE 2.13* 1.98* 2.06*  CALCIUM 7.4* 7.8* 7.6*  MG 2.2 2.4 2.2  PHOS 3.5  --  3.4   Liver Function Tests  Recent Labs  12/22/14 0414 12/23/14 0423  ALBUMIN 1.6* 1.6*   No results for input(s): LIPASE, AMYLASE in the last 72 hours. Cardiac Enzymes  Recent Labs  12/20/14 1554  12/20/14 2029  TROPONINI 3.51* 3.72*    TELE  NSR   Radiology/Studies   2D ECHO: 12/20/2014 LV EF: 30 - 35% Study Conclusions - Left ventricle: The cavity size was normal. Wall thickness was   normal. Systolic function was moderately to severely reduced. The   estimated ejection fraction was in the range of 30% to 35%.   Severe hypokinesis of the mid-apicalanteroseptal, anterior, and   apical myocardium. Doppler parameters are consistent with   abnormal left ventricular relaxation (grade 1 diastolic   dysfunction). - Atrial septum: No defect or patent foramen ovale was identified.  ASSESSMENT AND PLAN 54 yo man with PMH of T2DM, CAD (prior stent 5-10 years ago at [facility]) and asthma who developed abdominal pain with associated nausea/vomiting a week ago Saturday(11/19) with progressive pain that led him to seek urgent care at [facility] where he was diagnosed with acute  pancreatitis with hypertriglyceridemia and DKA leading to transfer to [facility]. He has been treated in the ICU with critical illness.   1. Elevated troponin. Suspect related to demand ischemia is setting of critical illness. No serial Ecg changes. 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   -- 2D ECHO with EF 30-35% and severe HK of the mid-apicalanteroseptal, anterior, and apical myocardium. G1DD. No previous ECHO to compare. Possibly new WMA and new low EF? Patient denies hx of CHF  -- Continue ASA and beta blocker. Titration of meds limited by low BP.   On high dose statin. -- He has presumed newly reduced EF with WMA, old MI by ECG, positive cardiac enzymes and chest pain. This clinical situation if worrisome for ischemic etiology; however, he is not a candidate for invasive cardiac evaluation at this time with ARF. We will continue to treat medically for now. --still waiting for records from [facility] concerning old stent.  -- will need ischemic evaluation once renal function recovered.  2. Acute systolic CHF - EF 30-35% and severe HK of the mid-apicalanteroseptal, anterior, and apical myocardium. G1DD. No previous ECHO to compare. Possibly new WMA and newly reduced EF. Lungs are clear. -- currently off diuretics due to hypernatremia.  He has be started on free water and D51/4 NS for hypernatremia.  -- Continue BB. Consider adding hydralazine + nitrates as he is not a candidate for ACE/ARB with ARF when BPs improved  3. Hypernatremia- continue free water  4. NSVT secondary to hypokalemia. No recurrence.  5. Acute pancreatitis  6. Acute respiratory failure due to SIRS. Resolved. Extubated.  Per PCCM.   7. ARF- creat improving. 5.3--> 2.81 >> 2.06 today  8. DM type 2 with recent DKA.  9. Hypokalemia/hypocalcemia- repleted  Signed,   Danyia Borunda [place], MDFACC 12/23/2014 10:07 AM

## 2014-12-23 NOTE — Evaluation (Signed)
Physical Therapy Evaluation Patient Details Name: Brent Rasmussen MRN: [id number] DOB: 10-11-1960 Today's Date: 12/23/2014   History of Present Illness  Patient is a 54 y/o male who presents with abdominal pain and found to have pancreatitis and be in DKA. s/p cardiac arrest 11/23, intubated x2, concern for NSTEMI, extubated 11/30. PMH includes T2DM, CAD (prior stent 5-10 years ago at [facility]) and asthma.   Clinical Impression  Patient presents with functional limitations due to deficits listed in PT problem list (see below). Pt attempted standing on 1 occasion but not able to clear bottom and then stopped due to feeling dizzy. BP low. Pt declined attempting mobility again due to increased pain in abdomen. Will need to follow acutely for further assessment of functional mobility when pt can tolerate it as pt's wife works and pt has stairs to climb at home. Will follow up to determine appropriate disposition and DME.    Follow Up Recommendations Home health PT;Supervision/Assistance - 24 hour    Equipment Recommendations  Other (comment) (TBD)    Recommendations for Other Services OT consult     Precautions / Restrictions Precautions Precautions: Fall Restrictions Weight Bearing Restrictions: No      Mobility  Bed Mobility               General bed mobility comments: Sitting in chair upon PT arrival.   Transfers Overall transfer level: Needs assistance Equipment used: Rolling walker (2 wheeled) Transfers: Sit to/from Stand           General transfer comment: Pt attempting to stand x1 however stopped due to feeling dizzy. Declined trying again due to pain.  Ambulation/Gait                Stairs            Wheelchair Mobility    Modified Rankin (Stroke Patients Only)       Balance Overall balance assessment:  (not assessed due to pain/dizziness.)                                           Pertinent Vitals/Pain Pain  Assessment: 0-10 Pain Score: 10-Worst pain ever Pain Location: abdomen Pain Descriptors / Indicators: Grimacing Pain Intervention(s): Monitored during session;Repositioned;Patient requesting pain meds-RN notified    Home Living Family/patient expects to be discharged to:: Private residence Living Arrangements: Spouse/significant other Available Help at Discharge: Family (Wife works) Type of Home: House Home Access: Stairs to enter Entrance Stairs-Rails: None Secretary/administrator of Steps: 4 Home Layout: Two level;Bed/bath upstairs Home Equipment: None      Prior Function Level of Independence: Independent         Comments: Pt works in [employer], research (life sciences)        Extremity/Trunk Assessment   Upper Extremity Assessment: Defer to OT evaluation           Lower Extremity Assessment: Generalized weakness         Communication   Communication: No difficulties (Not very verbal per pt choice)  Cognition Arousal/Alertness: Awake/alert Behavior During Therapy: WFL for tasks assessed/performed Overall Cognitive Status: Within Functional Limits for tasks assessed                      General Comments General comments (skin integrity, edema, etc.): BP in chair 99/64, BP post attempting to stand 93/59. Pt  dizzy.    Exercises        Assessment/Plan    PT Assessment Patient needs continued PT services  PT Diagnosis Generalized weakness;Acute pain   PT Problem List Decreased strength;Pain;Cardiopulmonary status limiting activity;Decreased activity tolerance;Decreased mobility  PT Treatment Interventions Balance training;Gait training;Functional mobility training;Therapeutic activities;Therapeutic exercise;Patient/family education   PT Goals (Current goals can be found in the Care Plan section) Acute Rehab PT Goals Patient Stated Goal: none stated PT Goal Formulation: With patient Time For Goal Achievement: 01/06/15 Potential to Achieve Goals:  Fair    Frequency Min 3X/week   Barriers to discharge Decreased caregiver support;Inaccessible home environment bedroom on second level and wife works during the day    Co-evaluation               End of Session Equipment Utilized During Treatment: Gait belt;Oxygen Activity Tolerance: Treatment limited secondary to medical complications (Comment);Patient limited by pain (dizziness, low BP) Patient left: in chair;with call bell/phone within reach;with chair alarm set Nurse Communication: Mobility status;Other (comment) (pain)         Time: 1125-1140 PT Time Calculation (min) (ACUTE ONLY): 15 min   Charges:   PT Evaluation $Initial PT Evaluation Tier I: 1 Procedure     PT G Codes:        Shail Urbas A Gillie Fleites 12/23/2014, 12:22 PM Mylo RedShauna Joory Gough, PT, DPT [phone]

## 2014-12-24 ENCOUNTER — Inpatient Hospital Stay (HOSPITAL_COMMUNITY): Payer: BLUE CROSS/BLUE SHIELD

## 2014-12-24 LAB — RENAL FUNCTION PANEL
ANION GAP: 12 (ref 5–15)
Albumin: 1.6 g/dL — ABNORMAL LOW (ref 3.5–5.0)
BUN: 57 mg/dL — ABNORMAL HIGH (ref 6–20)
CHLORIDE: 125 mmol/L — AB (ref 101–111)
CO2: 29 mmol/L (ref 22–32)
Calcium: 7.6 mg/dL — ABNORMAL LOW (ref 8.9–10.3)
Creatinine, Ser: 1.99 mg/dL — ABNORMAL HIGH (ref 0.61–1.24)
GFR, EST AFRICAN AMERICAN: 42 mL/min — AB (ref >60)
GFR, EST NON AFRICAN AMERICAN: 36 mL/min — AB (ref >60)
Glucose, Bld: 157 mg/dL — ABNORMAL HIGH (ref 65–99)
POTASSIUM: 3.2 mmol/L — AB (ref 3.5–5.1)
Phosphorus: 3.3 mg/dL (ref 2.5–4.6)
Sodium: 166 mmol/L (ref 135–145)

## 2014-12-24 LAB — CBC WITH DIFFERENTIAL/PLATELET
BASOS ABS: 0 10*3/uL (ref 0.0–0.1)
BASOS PCT: 0 %
EOS PCT: 1 %
Eosinophils Absolute: 0.1 10*3/uL (ref 0.0–0.7)
HCT: 35.1 % — ABNORMAL LOW (ref 39.0–52.0)
Hemoglobin: 10.4 g/dL — ABNORMAL LOW (ref 13.0–17.0)
LYMPHS ABS: 1 10*3/uL (ref 0.7–4.0)
LYMPHS PCT: 7 %
MCH: 28.6 pg (ref 26.0–34.0)
MCHC: 29.6 g/dL — AB (ref 30.0–36.0)
MCV: 96.4 fL (ref 78.0–100.0)
MONOS PCT: 2 %
Monocytes Absolute: 0.2 10*3/uL (ref 0.1–1.0)
NEUTROS ABS: 13.2 10*3/uL — AB (ref 1.7–7.7)
NEUTROS PCT: 90 %
PLATELETS: 183 10*3/uL (ref 150–400)
RBC: 3.64 MIL/uL — ABNORMAL LOW (ref 4.22–5.81)
RDW: 16.3 % — AB (ref 11.5–15.5)
WBC: 14.5 10*3/uL — AB (ref 4.0–10.5)

## 2014-12-24 LAB — GLUCOSE, CAPILLARY
GLUCOSE-CAPILLARY: 105 mg/dL — AB (ref 65–99)
GLUCOSE-CAPILLARY: 127 mg/dL — AB (ref 65–99)
GLUCOSE-CAPILLARY: 134 mg/dL — AB (ref 65–99)
GLUCOSE-CAPILLARY: 139 mg/dL — AB (ref 65–99)
GLUCOSE-CAPILLARY: 142 mg/dL — AB (ref 65–99)
GLUCOSE-CAPILLARY: 66 mg/dL (ref 65–99)
Glucose-Capillary: 111 mg/dL — ABNORMAL HIGH (ref 65–99)
Glucose-Capillary: 124 mg/dL — ABNORMAL HIGH (ref 65–99)
Glucose-Capillary: 142 mg/dL — ABNORMAL HIGH (ref 65–99)
Glucose-Capillary: 147 mg/dL — ABNORMAL HIGH (ref 65–99)
Glucose-Capillary: 168 mg/dL — ABNORMAL HIGH (ref 65–99)

## 2014-12-24 LAB — BASIC METABOLIC PANEL
Anion gap: 7 (ref 5–15)
BUN: 48 mg/dL — AB (ref 6–20)
CO2: 29 mmol/L (ref 22–32)
Calcium: 7.6 mg/dL — ABNORMAL LOW (ref 8.9–10.3)
Chloride: 128 mmol/L — ABNORMAL HIGH (ref 101–111)
Creatinine, Ser: 1.77 mg/dL — ABNORMAL HIGH (ref 0.61–1.24)
GFR calc Af Amer: 48 mL/min — ABNORMAL LOW (ref >60)
GFR, EST NON AFRICAN AMERICAN: 42 mL/min — AB (ref >60)
GLUCOSE: 111 mg/dL — AB (ref 65–99)
POTASSIUM: 3.4 mmol/L — AB (ref 3.5–5.1)
Sodium: 164 mmol/L (ref 135–145)

## 2014-12-24 LAB — MAGNESIUM: MAGNESIUM: 2.1 mg/dL (ref 1.7–2.4)

## 2014-12-24 MED ORDER — DEXTROSE 5 % IV SOLN
INTRAVENOUS | Status: DC
  Administered 2014-12-24: 05:00:00 via INTRAVENOUS

## 2014-12-24 MED ORDER — DEXTROSE 50 % IV SOLN
INTRAVENOUS | Status: AC
  Filled 2014-12-24: qty 50

## 2014-12-24 MED ORDER — POTASSIUM CHLORIDE 20 MEQ/15ML (10%) PO SOLN
60.0000 meq | Freq: Once | ORAL | Status: AC
  Administered 2014-12-24: 60 meq
  Filled 2014-12-24: qty 45

## 2014-12-24 MED ORDER — DEXTROSE 50 % IV SOLN
50.0000 mL | Freq: Once | INTRAVENOUS | Status: AC
  Administered 2014-12-24: 50 mL via INTRAVENOUS
  Filled 2014-12-24: qty 50

## 2014-12-24 MED ORDER — VITAL AF 1.2 CAL PO LIQD
1000.0000 mL | ORAL | Status: DC
  Administered 2014-12-24 – 2014-12-25 (×2): 1000 mL
  Filled 2014-12-24 (×6): qty 1000

## 2014-12-24 MED ORDER — CARVEDILOL 12.5 MG PO TABS
12.5000 mg | ORAL_TABLET | Freq: Two times a day (BID) | ORAL | Status: DC
  Administered 2014-12-24 – 2015-01-01 (×16): 12.5 mg via ORAL
  Filled 2014-12-24 (×18): qty 1

## 2014-12-24 MED ORDER — INSULIN NPH (HUMAN) (ISOPHANE) 100 UNIT/ML ~~LOC~~ SUSP
20.0000 [IU] | Freq: Four times a day (QID) | SUBCUTANEOUS | Status: DC
  Administered 2014-12-24 – 2014-12-27 (×12): 20 [IU] via SUBCUTANEOUS
  Filled 2014-12-24: qty 10

## 2014-12-24 MED ORDER — POTASSIUM CL IN DEXTROSE 5% 20 MEQ/L IV SOLN
20.0000 meq | INTRAVENOUS | Status: DC
  Administered 2014-12-24 – 2014-12-26 (×3): 20 meq via INTRAVENOUS
  Filled 2014-12-24 (×7): qty 1000

## 2014-12-24 MED ORDER — POTASSIUM CL IN DEXTROSE 5% 20 MEQ/L IV SOLN
20.0000 meq | INTRAVENOUS | Status: DC
  Administered 2014-12-24: 20 meq via INTRAVENOUS
  Filled 2014-12-24 (×4): qty 1000

## 2014-12-24 MED ORDER — POTASSIUM CHLORIDE 20 MEQ/15ML (10%) PO SOLN
40.0000 meq | Freq: Once | ORAL | Status: AC
Start: 2014-12-24 — End: 2014-12-24
  Administered 2014-12-24: 40 meq
  Filled 2014-12-24: qty 30

## 2014-12-24 NOTE — Progress Notes (Signed)
Hypoglycemic Event  CBG: 66  Treatment: D50 IV 50 mL  Symptoms: None  Follow-up CBG: Time:1223 CBG Result:105  Possible Reasons for Event: Medication regimen: tube feeding off  Comments/MD notified Dr Raechel AcheNestor    Vuk Skillern, Mellody MemosPhyllis W

## 2014-12-24 NOTE — Progress Notes (Signed)
CRITICAL VALUE ALERT  Critical value received:  Na 166  Date of notification:  12/24/2014  Time of notification:  04:30  Critical value read back:Yes.    Nurse who received alert:  BShepherd, RN  MD notified (1st page):  Vassie LollAlva  Time of first page:  04:31  MD notified (2nd page):  Time of second page:  Responding MD:  Vassie LollAlva  Time MD responded:  04:32

## 2014-12-24 NOTE — Progress Notes (Signed)
Physical Therapy Treatment Patient Details Name: Brent Rasmussen MRN: [id number] DOB: 01/26/1960 Today's Date: 12/24/2014    History of Present Illness Patient is a 54 y/o male who presents with abdominal pain and found to have pancreatitis and be in DKA. s/p cardiac arrest 11/23, intubated x2, concern for NSTEMI, extubated 11/30. PMH includes T2DM, CAD (prior stent 5-10 years ago at [facility]) and asthma.     PT Comments    Patient progressing slowly towards PT goals. Pt is impulsive putting him at increased risk for falls. Tolerated SPT to chair with Min A for balance and very unsteady. Tolerated there ex. Gait deferred secondary to symptomatic orthostatic hypotension. Eager to get a wash up. Encouraged exercise daily while sitting in chair to improve strength. Discharge recommendation updated to [facility]. If pt progresses well enough, will be able to go home. Will follow acutely.   Follow Up Recommendations  SNF     Equipment Recommendations  Other (comment) (TBD)    Recommendations for Other Services       Precautions / Restrictions Precautions Precautions: Fall Precaution Comments: NG tube Restrictions Weight Bearing Restrictions: No    Mobility  Bed Mobility Overal bed mobility: Needs Assistance Bed Mobility: Rolling;Sidelying to Sit Rolling: Supervision Sidelying to sit: Min guard;HOB elevated       General bed mobility comments: Impulsive to get to EOB requiring Min guard to prevent falling forward. Use of rail. + dizziness.   Transfers Overall transfer level: Needs assistance Equipment used: None Transfers: Sit to/from [employer] Sit to Stand: Min guard Stand pivot transfers: Min assist       General transfer comment: Pt impulsively stands and without cues, SPT to chair. Very unsteady falling into chair.   Ambulation/Gait                 Stairs            Wheelchair Mobility    Modified Rankin (Stroke Patients Only)        Balance Overall balance assessment: Needs assistance Sitting-balance support: Feet supported;No upper extremity supported Sitting balance-Leahy Scale: Fair     Standing balance support: During functional activity Standing balance-Leahy Scale: Poor                      Cognition Arousal/Alertness: Awake/alert Behavior During Therapy: Impulsive Overall Cognitive Status: Within Functional Limits for tasks assessed                      Exercises General Exercises - Lower Extremity Ankle Circles/Pumps: Both;15 reps;Seated Quad Sets: Both;10 reps;Seated Short Arc Quad: Both;10 reps;Seated    General Comments General comments (skin integrity, edema, etc.): BP in supine 141/87, BP post transfer 98/77 with dizziness - resolved. Sp02 remained >92% on RA.       Pertinent Vitals/Pain Pain Assessment: No/denies pain    Home Living                      Prior Function            PT Goals (current goals can now be found in the care plan section) Progress towards PT goals: Progressing toward goals (slowly)    Frequency  Min 3X/week    PT Plan Discharge plan needs to be updated    Co-evaluation             End of Session Equipment Utilized During Treatment: Gait belt Activity Tolerance: Treatment  limited secondary to medical complications (Comment) (dizziness and orthostatic hypotension) Patient left: in chair;with call bell/phone within reach;with chair alarm set     Time: 1421-1444 PT Time Calculation (min) (ACUTE ONLY): 23 min  Charges:  $Therapeutic Activity: 8-22 mins                    G Codes:      Aara Jacquot A Jenisa Monty 12/24/2014, 2:56 PM  Mylo RedShauna Takila Kronberg, PT, DPT [phone]

## 2014-12-24 NOTE — Progress Notes (Signed)
Nutrition Follow-up  DOCUMENTATION CODES:   Obesity unspecified  INTERVENTION:    Change TF to Vital AF 1.2 with goal rate of 80 ml/h (1920 ml per day) to provide 2304 kcals, 144 gm protein, 1557 ml free water daily.  D/C Prostat.  NUTRITION DIAGNOSIS:   Inadequate oral intake related to inability to eat as evidenced by NPO status.  Ongoing  GOAL:   Provide needs based on ASPEN/SCCM guidelines  Unmet with re-estimated needs  MONITOR:   Labs, Vent status, Weight trends, TF tolerance, I & O's  ASSESSMENT:   54 y/o male PMHx DM, CAD, Bell's Palsy who presented to hospital for abdominal pain and nausea. Found to have pancreatitis and be in DKA. Endorses c/n/v and poor PO intake. 11/23 code blue called. CPR x2 min with cpr rosc after 2 min. Intubated on 11/24 due to worsening respiratory status. Extubated 11/25. Reintubated 11/26. Cortrak placed 11/26  Discussed patient in ICU rounds and with RN today. Patient was extubated on 11/30. Cortrak tube was pulled by patient this AM. Replaced by Cortrak team this afternoon, awaiting x-ray confirmation of post-pyloric tube placement. Remains NPO until pain free per CCM physician. Patient c/o diarrhea and not feeling hungry; suspect diarrhea related to acute critical illness and medications.   Diet Order:  Diet NPO time specified  Skin:  Reviewed, no issues  Last BM:  12/1  Height:   Ht Readings from Last 1 Encounters:  12/15/14 5\' 10"  (1.778 m)    Weight:   Wt Readings from Last 1 Encounters:  12/24/14 233 lb 0.4 oz (105.7 kg)    Ideal Body Weight:  75.45 kg  BMI:  Body mass index is 33.44 kg/(m^2).  Estimated Nutritional Needs:   Kcal:  2200-2400  Protein:  125-150 gm  Fluid:  2.2-2.4 L  EDUCATION NEEDS:   No education needs identified at this time  Joaquin CourtsKimberly Harris, RD, LDN, CNSC Pager [phone] After Hours Pager [phone]

## 2014-12-24 NOTE — Progress Notes (Signed)
eLink Physician-Brief Progress Note Patient Name: Brent ShamesDaniel J Rasmussen DOB: 08/10/1960 MRN: [id number]   Date of Service  12/24/2014  HPI/Events of Note  Na 166  eICU Interventions  Dc bicarb - alkalotic D5W @ 75/h Hypokalemia -repleted      Intervention Category Intermediate Interventions: Electrolyte abnormality - evaluation and management  [name] V. 12/24/2014, 4:41 AM

## 2014-12-24 NOTE — Progress Notes (Signed)
CRITICAL VALUE ALERT  Critical value received:  Na 164 and k3.4  Date of notification:  12/02  Time of notification:  1500  Critical value read back:Yes.    Nurse who received alert:  Acey LavPhyllis Yliana Gravois  MD notified (1st page):  Dr Jamison NeighborNestor  Time of first page:  1502  MD notified (2nd page):  Time of second page:  Responding MD:  Dr Jamison NeighborNestor  Time MD responded:  1502 at bedside

## 2014-12-24 NOTE — Progress Notes (Signed)
PULMONARY / CRITICAL CARE MEDICINE   Name: Brent Rasmussen MRN: [id number] DOB: 06/21/1960    ADMISSION DATE:  12/14/2014 CONSULTATION DATE:  12/14/14  REFERRING MD :  [facility]  CHIEF COMPLAINT:  DKA, pancreatitis  INITIAL PRESENTATION: Brent Rasmussen is a 54 y.o. male that presented to [facility] for abdominal pain. He was found to have pancreatitis and be in DKA. There he was provided 3.5 L bolus. He was started on an insulin drip and [facility].  STUDIES:  11/22 MRI brain: No acute processes 11/22 CT abdomen: Moderate to severe acute pancreatitis without fluid collection, abscess or pseudocyst. 11/25 repeat CT: Worsening diffuse pancreatitis, no abscess, no cysts 11/28 TTE:  EF 30-35% w/ severe hypokinesis & anterior wall motion abnormality. 11/29 KUB:  DHT tip in distal duodenum 12/02 KUB:  DHT w/ tip in distal duodenum or transverse segment  SIGNIFICANT EVENTS: 11/22 -[facility] 11/23- BM vasal vagals, cpr 2 min , ETT placed, on presors 11/24- aline placed, major improved pressors, volume given 11/25- extubated, escallating abdo pain 11/26- resp failure, re intubated 11/26- concern NSTEMI, heparin started 11/27- neg 6.4 liters 11/30- extubated 12/01- radial arterial line removed 12/02- pulled out DHT & had to have it replaced  Cultures: BCx2 11/22:  Negative  Antibiotics: Imipenem 11/24 - 11/30  Lines: DHT:  11/28 - 12/2 & 12/2>> Radial Art Line:  Removed 12/1 ETT: 11/23 - 11/25, 11/26 - 11/30 OGT: 11/26 - 11/29  SUBJECTIVE: Patient reports pain is nonexistent and this morning. Although, nursing staff report the patient has been activating his PCA pump frequently rating his pain as 4-5 out of 10. Patient denies any nausea or vomiting. He has had frequent bowel movements which have aggravated him to the point where he pulled out his Dobbhoff tube this morning. He has been requesting to eat  and ice chips. Denies any dyspnea but is coughing intermittently. Normal work of breathing on nasal cannula oxygen. Successfully transitioned to PCA yesterday.  ROS:  No fever, chills, or sweats. No chest pain or pressure.  VITAL SIGNS: Temp:  [98 F (36.7 C)-100 F (37.8 C)] 98 F (36.7 C) (12/02 1551) Pulse Rate:  [79-100] 100 (12/02 1700) Resp:  [15-40] 24 (12/02 1700) BP: (109-141)/(63-92) 123/78 mmHg (12/02 1700) SpO2:  [93 %-98 %] 96 % (12/02 1700) Weight:  [233 lb 0.4 oz (105.7 kg)] 233 lb 0.4 oz (105.7 kg) (12/02 0500) HEMODYNAMICS:   VENTILATOR SETTINGS:   INTAKE / OUTPUT:  Intake/Output Summary (Last 24 hours) at 12/24/14 1740 Last data filed at 12/24/14 1700  Gross per 24 hour  Intake 5017.5 ml  Output   2400 ml  Net 2617.5 ml    PHYSICAL EXAMINATION: General:  Awake. Laying in bed. Appears comfortable. Integument:  Warm & dry. No rash on exposed skin.  HEENT:  PERRL. No scleral icterus. DHT out at the time of exam. Cardiovascular:  Regular rate. No appreciable JVD.  No edema. Pulmonary:   Diminished breath sounds bilateral lung bases. Normal work of breathing on nasal cannula oxygen now. Abdomen: Soft. Normal bowel sounds. Protuberant. Nontender. Neurological:  Following commands. Moving all 4 extremities. Oriented x4.  LABS:  CBC  Recent Labs Lab 12/22/14 0414 12/23/14 0423 12/24/14 0533  WBC 15.3* 16.5* 14.5*  HGB 11.3* 11.0* 10.4*  HCT 36.4* 35.2* 35.1*  PLT 173 202 183   Coag's No results for input(s): APTT, INR in the last  168 hours. BMET  Recent Labs Lab 12/23/14 1400 12/24/14 0350 12/24/14 1406  NA 166* 166* 164*  K 3.1* 3.2* 3.4*  CL 129* 125* 128*  CO2 BUN 60* 57* 48*  CREATININE 2.11* 1.99* 1.77*  GLUCOSE 199* 157* 111*   Electrolytes  Recent Labs Lab 12/22/14 0414 12/22/14 1702 12/23/14 0423 12/23/14 1400 12/24/14 0350 12/24/14 1406  CALCIUM 7.4* 7.8* 7.6* 7.7* 7.6* 7.6*  MG 2.2 2.4 2.2  --  2.1  --    PHOS 3.5  --  3.4  --  3.3  --    Sepsis Markers  Recent Labs Lab 12/17/14 2155 12/18/14 0036  12/20/14 0928 12/21/14 0500 12/22/14 0414  LATICACIDVEN 0.8 0.7  --   --   --   --   PROCALCITON  --   --   < > 0.32 0.30 0.34  < > = values in this interval not displayed. ABG  Recent Labs Lab 12/18/14 0751 12/18/14 1043 12/20/14 0258  PHART 7.295* 7.436 7.522*  PCO2ART 40.9 29.5* 31.5*  PO2ART 71.0* 269.0* 73.0*   Liver Enzymes  Recent Labs Lab 12/18/14 0401 12/20/14 0500  12/22/14 0414 12/23/14 0423 12/24/14 0350  AST 49* 22  --   --   --   --   ALT 58 22  --   --   --   --   ALKPHOS 50 50  --   --   --   --   BILITOT 1.1 1.0  --   --   --   --   ALBUMIN 1.6* 1.6*  < > 1.6* 1.6* 1.6*  < > = values in this interval not displayed. Cardiac Enzymes  Recent Labs Lab 12/20/14 0928 12/20/14 1554 12/20/14 2029  TROPONINI 4.38* 3.51* 3.72*   Glucose  Recent Labs Lab 12/23/14 2336 12/24/14 0312 12/24/14 0747 12/24/14 1133 12/24/14 1226 12/24/14 1551  GLUCAP 168* 124* 127* 66 105* 111*    Imaging Dg Abd Portable 1v  12/24/2014  CLINICAL DATA:  Feeding tube placement EXAM: PORTABLE ABDOMEN - 1 VIEW COMPARISON:  Plain film of the abdomen dated 12/22/2014. FINDINGS: The tip of the feeding tube now appears to be in the descending duodenum or proximal transverse segment, slightly retracted in position compared to the earlier exam. IMPRESSION: IMPRESSION Feeding tube adequately positioned with tip in the distal portion of the descending duodenum or proximal transverse segment, previously within the distal duodenum. Electronically Signed   By: Bary Richard M.D.   On: 12/24/2014 14:32    ASSESSMENT / PLAN:  54 year old male with severe pancreatitis and septic shock. Septic shock has resolved. Patient developed acute hypoxic respiratory failure requiring intubation likely secondary to combination of fluid overload and sedation with pain medication. He was successfully  extubated on 11/30 and has been tolerating nasal cannula as well as giving out of bed to chair. Patient is frustrated by the frequency of bowel movements on tube feedings and voluntarily withdrew his Dobbhoff tube this morning without adverse effect. He did agree to having his Dobbhoff tube replaced and tube feedings have been restarted. His pain seems to be very well controlled on his fentanyl PCA at this time. His hypernatremia is stable and very slowly improving as well. Given his continued clinical stability I feel it's reasonable to transition him to a stepdown bed at this time. I did update his wife at bedside this morning as well.   1. Severe pancreatitis: Likely secondary to a combination of DKA &  hypertriglyceridemia. Completed 7 day course of imipenem. Continuing nothing by mouth status. Tolerating postpyloric tube feeds. Continuing treatment of pain with fentanyl PCA. 2. Acute hypoxic respiratory failure: Continuing incentive spirometry to minimize atelectasis. Weaning FiO2 for saturation greater than 94%. Holding on further diuresis given hypernatremia. Continuing albuterol nebulizers every 6 hours. Extubated now 48 hours.  3. Hyponatremia: Slowly improving. Continuing free water via Dobbhoff tube. Continuing D5W w/ 20 mEq KCl at 75 mL per hour. Trending electrolytes every 12 hours.  4. Diabetes mellitus: Previously in DKA on admission. Transitioned to NPH every 6 hours & Accu-Cheks every 4 hours with sliding scale insulin coverage. As renal function improves can likely transition to Lantus. Was requiring NPH 40 units subcutaneous every 6 hours on prior tube feeding goals. 5. NSTEMI/demand ischemia: Cardiology following. Ejection fraction 30-35%. Known history of coronary artery disease. Continuing Coreg, aspirin, & Lipitor. Avoiding IV heparin given potential for hemorrhagic transformation of pancreatitis. 6. Shock: Likely secondary to sepsis. Resolved. 7. Acute renal failure: Slowly continuing  to improve. Continuing to monitor renal output as well as trending daily BUN/creatinine.  8. Toxic metabolic encephalopathy: Resolved. 9. Hypokalemia: Replacing via tube and also with IV fluid.  10. Diet: Nothing by mouth. Postpyloric Dobbhoff with tube feedings. 11. Prophylaxis: Pantoprazole via the tube daily. Heparin subcutaneous every 8 hours & SCDs.  12. Disposition: Previously consulted physical therapy. Transitioning patient to stepdown unit bed. Contacted Dr. Carolyne Littles for hospitalist service to assume care & PCCM will sign off on 12/3.  Donna Christen Jamison Neighbor, M.D. [facility]monary [facility] Pager:  [phone] After 3pm or if no response, call [phone] 12/24/2014, 5:40 PM

## 2014-12-24 NOTE — Progress Notes (Addendum)
Patient Name: Brent Rasmussen Date of Encounter: 12/24/2014  Active Problems:   DKA (diabetic ketoacidoses) (HCC)   Pancreatitis   Acute respiratory failure with hypoxia (HCC)   Pancreatitis, acute   Encounter for intubation   Encounter for orogastric (OG) tube placement   Encounter for nasogastric (NG) tube placement   NSVT (nonsustained ventricular tachycardia) (HCC)   Elevated troponin   Hypokalemia   Acute renal failure (HCC)   Acute systolic CHF (congestive heart failure) (HCC)   Hypernatremia    Primary Cardiologist: New Patient Profile: 54 yo malw w/PMH of T2DM, CAD (prior stent 5-10 years ago at [facility]), and asthma admitted on 12/14/2014 for acute pancreatitis, DKA, and renal failure. Cards consulted on 12/18/2014 for elevated troponin and 35-beat run of NSVT.  SUBJECTIVE: Reports improvement in his abdominal pain. Denies any chest pain, palpitations, or shortness of breath.  OBJECTIVE Filed Vitals:   12/24/14 0500 12/24/14 0600 12/24/14 0700 12/24/14 0809  BP: 132/90 113/68 118/73   Pulse: 98 92 91   Temp:      TempSrc:      Resp: Height:      Weight: 233 lb 0.4 oz (105.7 kg)     SpO2: 94% 95% 96% 97%    Intake/Output Summary (Last 24 hours) at 12/24/14 0830 Last data filed at 12/24/14 0730  Gross per 24 hour  Intake 5957.5 ml  Output   2900 ml  Net 3057.5 ml   Filed Weights   12/22/14 0449 12/23/14 0400 12/24/14 0500  Weight: 228 lb 6.3 oz (103.6 kg) 226 lb 3.1 oz (102.6 kg) 233 lb 0.4 oz (105.7 kg)    PHYSICAL EXAM General: Well developed, well nourished, male in no acute distress. Head: Normocephalic, atraumatic.  Neck: Supple without bruits, JVD not elevated. Lungs:  Resp regular and unlabored, CTA without wheezing or rales. Heart: RRR, S1, S2, no S3, S4, or murmur; no rub. Abdomen: Soft, non-tender, non-distended with normoactive bowel sounds. No hepatomegaly. No rebound/guarding. No obvious abdominal masses. Extremities:  No clubbing, cyanosis, or edema. Distal pedal pulses are 2+ bilaterally. Neuro: Alert and oriented X 3. Moves all extremities spontaneously. Psych: Normal affect.  LABS: CBC: Recent Labs  12/23/14 0423 12/24/14 0533  WBC 16.5* 14.5*  NEUTROABS 14.6* 13.2*  HGB 11.0* 10.4*  HCT 35.2* 35.1*  MCV 94.1 96.4  PLT 202 183   INR:No results for input(s): INR in the last 72 hours. Basic Metabolic Panel: Recent Labs  12/23/14 0423 12/23/14 1400 12/24/14 0350  NA 166* 166* 166*  K 3.1* 3.1* 3.2*  CL 127* 129* 125*  CO2 GLUCOSE 152* 199* 157*  BUN 66* 60* 57*  CREATININE 2.06* 2.11* 1.99*  CALCIUM 7.6* 7.7* 7.6*  MG 2.2  --  2.1  PHOS 3.4  --  3.3   Liver Function Tests: Recent Labs  12/23/14 0423 12/24/14 0350  ALBUMIN 1.6* 1.6*    TELE:     NSR with rate in 80's - 90's. No evidence of NSVT.   ECHO: 12/20/2014 Study Conclusions - Left ventricle: The cavity size was normal. Wall thickness was normal. Systolic function was moderately to severely reduced. The estimated ejection fraction was in the range of 30% to 35%. Severe hypokinesis of the mid-apicalanteroseptal, anterior, and apical myocardium. Doppler parameters are consistent with abnormal left ventricular relaxation (grade 1 diastolic dysfunction). - Atrial septum: No defect or patent foramen ovale was identified.  Radiology/Studies: Dg Abd 1  View: 12/22/2014  CLINICAL DATA:  Feeding tube placement EXAM: ABDOMEN - 1 VIEW COMPARISON:  12/21/2014 FINDINGS: Feeding tube tip is in the distal duodenum as before. Mild gas distention of small bowel with air present in the colon distally. Monitor leads overlie the chest. No acute osseous finding or abnormal calcification. IMPRESSION: Feeding tube tip distal duodenum. Electronically Signed   By: Judie PetitM.  Shick M.D.   On: 12/22/2014 17:27     Current Medications:  . antiseptic oral rinse  7 mL Mouth Rinse QID  . aspirin  325 mg Oral Daily  .  atorvastatin  40 mg Oral q1800  . carvedilol  6.25 mg Oral BID WC  . chlorhexidine gluconate  15 mL Mouth Rinse BID  . feeding supplement (PRO-STAT SUGAR FREE 64)  30 mL Per Tube QID  . fentaNYL   Intravenous 6 times per day  . free water  400 mL Per Tube Q4H  . heparin subcutaneous  5,000 Units Subcutaneous 3 times per day  . insulin aspart  0-20 Units Subcutaneous 6 times per day  . insulin NPH Human  40 Units Subcutaneous 4 times per day  . pantoprazole sodium  40 mg Per Tube Daily   . dextrose 75 mL/hr at 12/24/14 0450  . feeding supplement (VITAL HIGH PROTEIN) 1,000 mL (12/23/14 1031)  . fentaNYL infusion INTRAVENOUS Stopped (12/23/14 1241)    ASSESSMENT AND PLAN:  1. Elevated troponin.  - Suspect related to demand ischemia is setting of critical illness. No serial Ecg changes. 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  - 2D ECHO with EF 30-35% and severe HK of the mid-apicalanteroseptal, anterior, and apical myocardium. G1DD. No previous ECHO to compare. Possibly new WMA and new low EF? Patient denies hx of CHF  -  Continue ASA and beta blocker. Titration of meds limited by low BP.On high dose statin. - He has presumed newly reduced EF with WMA, old MI by ECG, positive cardiac enzymes and chest pain. This clinical situation if worrisome for ischemic etiology; however, he is not a candidate for invasive cardiac evaluation at this time with ARF. We will continue to treat medically for now. - [facility]. Called again and left voicemail - will need ischemic evaluation once renal function recovered.  2. Acute systolic CHF  - EF 30-35% and severe HK of the mid-apicalanteroseptal, anterior, and apical myocardium. G1DD. No previous ECHO to compare. Possibly new WMA and newly reduced EF. Lungs are clear. - Currently off diuretics due to hypernatremia. He has be started on free water and D51/4 NS for hypernatremia. Is currently + 9.2L this  admission. - Continue BB. Consider adding hydralazine + nitrates as he is not a candidate for ACE/ARB with ARF when BPs improved  3. Hypernatremia - continue free water  4. NSVT  - secondary to hypokalemia. No recurrence on telemetry.  5. Acute pancreatitis - per admitting team  6. Acute respiratory failure  - due to SIRS. Resolved. Extubated. Per PCCM.   7. ARF - peaked at 5.3 on 12/18/2014. - currently at 1.99 on 12/24/2014.  8. DM type 2  - with recent DKA.  9. Hypokalemia/hypocalcemia - repleted  Signed, Ellsworth LennoxBrittany M Strader , PA-C 8:30 AM 12/24/2014 Pager: [phone] Patient seen and examined and history reviewed. Agree with above findings and plan. Patient denies any dyspnea or chest pain. Voice improved. Abdominal pain improved. Still hypernatremic. Diuretics on hold and receiving free water IV and po.  Renal function has plateaued with creatinine around 2. Still attempting to get old records from [facility] about remote stent. Will decide about ischemic work up prior to discharge. If renal function improves further would favor cardiac cath. If not could at least get a risk assessment with a Myoview study. Will increase Coreg dose today.  Tyriq Moragne [place], MDFACC 12/24/2014 9:00 AM

## 2014-12-25 DIAGNOSIS — J810 Acute pulmonary edema: Secondary | ICD-10-CM

## 2014-12-25 DIAGNOSIS — G928 Other toxic encephalopathy: Secondary | ICD-10-CM | POA: Diagnosis present

## 2014-12-25 DIAGNOSIS — J811 Chronic pulmonary edema: Secondary | ICD-10-CM | POA: Diagnosis present

## 2014-12-25 DIAGNOSIS — G92 Toxic encephalopathy: Secondary | ICD-10-CM | POA: Diagnosis present

## 2014-12-25 LAB — RENAL FUNCTION PANEL
ANION GAP: 9 (ref 5–15)
Albumin: 1.6 g/dL — ABNORMAL LOW (ref 3.5–5.0)
BUN: 42 mg/dL — ABNORMAL HIGH (ref 6–20)
CALCIUM: 7.8 mg/dL — AB (ref 8.9–10.3)
CHLORIDE: 126 mmol/L — AB (ref 101–111)
CO2: 27 mmol/L (ref 22–32)
Creatinine, Ser: 1.61 mg/dL — ABNORMAL HIGH (ref 0.61–1.24)
GFR calc non Af Amer: 47 mL/min — ABNORMAL LOW (ref >60)
GFR, EST AFRICAN AMERICAN: 54 mL/min — AB (ref >60)
Glucose, Bld: 128 mg/dL — ABNORMAL HIGH (ref 65–99)
POTASSIUM: 3.3 mmol/L — AB (ref 3.5–5.1)
Phosphorus: 3 mg/dL (ref 2.5–4.6)
Sodium: 162 mmol/L (ref 135–145)

## 2014-12-25 LAB — BASIC METABOLIC PANEL
ANION GAP: 9 (ref 5–15)
BUN: 43 mg/dL — ABNORMAL HIGH (ref 6–20)
CALCIUM: 7.6 mg/dL — AB (ref 8.9–10.3)
CHLORIDE: 124 mmol/L — AB (ref 101–111)
CO2: 24 mmol/L (ref 22–32)
Creatinine, Ser: 1.59 mg/dL — ABNORMAL HIGH (ref 0.61–1.24)
GFR calc non Af Amer: 48 mL/min — ABNORMAL LOW (ref >60)
GFR, EST AFRICAN AMERICAN: 55 mL/min — AB (ref >60)
Glucose, Bld: 178 mg/dL — ABNORMAL HIGH (ref 65–99)
POTASSIUM: 3 mmol/L — AB (ref 3.5–5.1)
Sodium: 157 mmol/L — ABNORMAL HIGH (ref 135–145)

## 2014-12-25 LAB — LIPID PANEL
CHOL/HDL RATIO: 6.5 ratio
CHOLESTEROL: 97 mg/dL (ref 0–200)
HDL: 15 mg/dL — ABNORMAL LOW (ref >40)
LDL Cholesterol: 47 mg/dL (ref 0–99)
TRIGLYCERIDES: 174 mg/dL — AB (ref <150)
VLDL: 35 mg/dL (ref 0–40)

## 2014-12-25 LAB — CBC WITH DIFFERENTIAL/PLATELET
Basophils Absolute: 0 10*3/uL (ref 0.0–0.1)
Basophils Relative: 0 %
Eosinophils Absolute: 0.1 10*3/uL (ref 0.0–0.7)
Eosinophils Relative: 1 %
HEMATOCRIT: 35 % — AB (ref 39.0–52.0)
HEMOGLOBIN: 10.2 g/dL — AB (ref 13.0–17.0)
LYMPHS ABS: 1 10*3/uL (ref 0.7–4.0)
LYMPHS PCT: 7 %
MCH: 28.2 pg (ref 26.0–34.0)
MCHC: 29.1 g/dL — ABNORMAL LOW (ref 30.0–36.0)
MCV: 96.7 fL (ref 78.0–100.0)
Monocytes Absolute: 0.2 10*3/uL (ref 0.1–1.0)
Monocytes Relative: 2 %
NEUTROS PCT: 90 %
Neutro Abs: 11.7 10*3/uL — ABNORMAL HIGH (ref 1.7–7.7)
Platelets: 178 10*3/uL (ref 150–400)
RBC: 3.62 MIL/uL — AB (ref 4.22–5.81)
RDW: 15.6 % — ABNORMAL HIGH (ref 11.5–15.5)
WBC: 12.9 10*3/uL — AB (ref 4.0–10.5)

## 2014-12-25 LAB — GLUCOSE, CAPILLARY
GLUCOSE-CAPILLARY: 136 mg/dL — AB (ref 65–99)
GLUCOSE-CAPILLARY: 148 mg/dL — AB (ref 65–99)
GLUCOSE-CAPILLARY: 168 mg/dL — AB (ref 65–99)
Glucose-Capillary: 122 mg/dL — ABNORMAL HIGH (ref 65–99)
Glucose-Capillary: 125 mg/dL — ABNORMAL HIGH (ref 65–99)
Glucose-Capillary: 141 mg/dL — ABNORMAL HIGH (ref 65–99)

## 2014-12-25 LAB — MAGNESIUM: Magnesium: 2.1 mg/dL (ref 1.7–2.4)

## 2014-12-25 LAB — BRAIN NATRIURETIC PEPTIDE: B Natriuretic Peptide: 404 pg/mL — ABNORMAL HIGH (ref 0.0–100.0)

## 2014-12-25 MED ORDER — FENTANYL CITRATE (PF) 100 MCG/2ML IJ SOLN
25.0000 ug | Freq: Four times a day (QID) | INTRAMUSCULAR | Status: DC | PRN
  Administered 2014-12-25 – 2014-12-29 (×16): 25 ug via INTRAVENOUS
  Filled 2014-12-25 (×18): qty 2

## 2014-12-25 MED ORDER — POTASSIUM CHLORIDE 20 MEQ/15ML (10%) PO SOLN
40.0000 meq | Freq: Once | ORAL | Status: AC
  Administered 2014-12-25: 40 meq
  Filled 2014-12-25: qty 30

## 2014-12-25 MED ORDER — JEVITY 1.5 CAL/FIBER PO LIQD
1000.0000 mL | ORAL | Status: DC
  Administered 2014-12-25 – 2014-12-27 (×3): 1000 mL
  Filled 2014-12-25 (×3): qty 1000

## 2014-12-25 MED ORDER — PRO-STAT SUGAR FREE PO LIQD
30.0000 mL | Freq: Four times a day (QID) | ORAL | Status: DC
  Administered 2014-12-25 – 2014-12-27 (×7): 30 mL
  Filled 2014-12-25 (×7): qty 30

## 2014-12-25 MED ORDER — VITAL AF 1.2 CAL PO LIQD
1000.0000 mL | ORAL | Status: DC
  Filled 2014-12-25: qty 1000

## 2014-12-25 MED ORDER — FREE WATER
400.0000 mL | Freq: Every day | Status: DC
  Administered 2014-12-25 – 2014-12-27 (×11): 400 mL

## 2014-12-25 NOTE — Progress Notes (Signed)
eLink Physician-Brief Progress Note Patient Name: Brent ShamesDaniel J Captain DOB: 1960-03-11 MRN: [id number]   Date of Service  12/25/2014  HPI/Events of Note  Patient has received one dose of Fentanyl PCA in last 8 hours.   eICU Interventions  Will order: 1. D/C Fentanyl PCA. 2. Fentanyl 25 mcg IV Q 6 hours PRN.      Intervention Category Intermediate Interventions: Pain - evaluation and management  Sommer,Steven Eugene 12/25/2014, 12:04 AM

## 2014-12-25 NOTE — Progress Notes (Signed)
       Patient Name: Brent Rasmussen Date of Encounter: 12/25/2014    SUBJECTIVE:No cardiac complaints. Wife complains he feels dyspneic. TELEMETRY:  NSR Filed Vitals:   12/25/14 0800 12/25/14 0801 12/25/14 1000 12/25/14 1100  BP: 109/63  115/71 112/72  Pulse: 87  87 78  Temp:  98.6 F (37 C)    TempSrc:  Oral    Resp: 25  36 30  Height:      Weight:      SpO2: 96%  95% 95%    Intake/Output Summary (Last 24 hours) at 12/25/14 1129 Last data filed at 12/25/14 0700  Gross per 24 hour  Intake   2500 ml  Output   1525 ml  Net    975 ml   LABS: Basic Metabolic Panel:  Recent Labs  56/21/3010/03/09 0350 12/24/14 1406 12/25/14 0534  NA 166* 164* 162*  K 3.2* 3.4* 3.3*  CL 125* 128* 126*  CO2 29 29 27   GLUCOSE 157* 111* 128*  BUN 57* 48* 42*  CREATININE 1.99* 1.77* 1.61*  CALCIUM 7.6* 7.6* 7.8*  MG 2.1  --  2.1  PHOS 3.3  --  3.0   CBC:  Recent Labs  12/24/14 0533 12/25/14 0534  WBC 14.5* 12.9*  NEUTROABS 13.2* 11.7*  HGB 10.4* 10.2*  HCT 35.1* 35.0*  MCV 96.4 96.7  PLT 183 178     Radiology/Studies:  No chest x ray  Physical Exam: Blood pressure 112/72, pulse 78, temperature 98.6 F (37 C), temperature source Oral, resp. rate 30, height 5\' 10"  (1.778 m), weight 233 lb 0.4 oz (105.7 kg), SpO2 95 %. Weight change:   Wt Readings from Last 3 Encounters:  12/24/14 233 lb 0.4 oz (105.7 kg)    Lying on side. Mild dyspnea Distended mildly tender abdomen. No edema.  ASSESSMENT:  1. A/?C systolic heart failure.  Duration unknown. 2. Ischemic heart disease with prior PCI. Need data from [facility]. 3. Acute kidney injury improving  Plan:  1. Chesk BNP 2. Invasive ischemic eval when medically stable.  Selinda EonSigned, SMITH III,HENRY W 12/25/2014, 11:29 AM

## 2014-12-25 NOTE — Progress Notes (Signed)
[facility] TEAM 1 - Stepdown/ICU TEAM Progress Note  Brent Rasmussen ZOX:[id number] DOB: [birth year] DOA: 12/14/2014 PCP: No primary care provider on file.  Admit HPI / Brief Narrative: Brent Rasmussen is a 54 y.o. WM PMHx Diabetes Type 2, HLD, CAD Native Artery, Asthma, Bell's Palsy   Presented to [facility] for abdominal pain. He was found to have pancreatitis and be in DKA. There he was provided 3.5 L bolus. He was started on an insulin drip and was transferred to [facility] for care.  HPI/Subjective: 12/2 A/O 4 , Mild abdominal pain, negative N/V, positive SOB, positive DOE. States is been worsening since hospitalization. Had cardiologist at [facility] but retired.   Assessment/Plan: Severe pancreatitis/Shock: -Shock resolved - Likely secondary to a combination of DKA & hypertriglyceridemia. Completed 7 day course of imipenem. Continuing nothing by mouth status. Tolerating postpyloric tube feeds. Continuing treatment of pain with fentanyl PCA.  Acute hypoxic respiratory failure: -Multifactorial to include severe shock, NSTEMI, fluid overload and acute systolic CHF -  Decrease fluid load -Holding on further diuresis given hypernatremia.  -Continue albuterol QID PRN -PCXR in A.m.  NSTEMI/demand ischemia:  -Cardiology following.  -Ejection fraction 30-35%. Known history of coronary artery disease. -Strict I&OSince admission; +10.2 L -Daily weight; on admission 104.3 kg            12/3 weight=??   -Continuing Coreg, aspirin, & Lipitor. Avoiding IV heparin given potential for hemorrhagic transformation of pancreatitis. -Per cardiology note will appear to need cardiac catheterization once more stable.  Acute systolic CHF -See NSTEMI -Decrease Vital AF 1.2 CAL tube feed 52ml/hr; fluid overload  Acute renal failure:  -Slowly continuing to improve.  -Continuing to monitor renal output as well as trending daily BUN/creatinine.  Hypernatremia (peaked at 166):  -Slowly  improving. Continuing free water via Dobbhoff tube.  -Decrease Continuing D5W w/ 20 mEq KCl at 50 mL per hour. (Patient fluid overload)  -Continue Free water 400 ml 5 x daily  Trending electrolytes every 12 hours.   Hypokalemia -See hypernatremia -Potassium 40 mEq 1  DM Type 2  -Hemoglobin A1c pending  -Lipid panel pending   Toxic metabolic encephalopathy:  -Improving, patient A/O 4 does not recall receiving echocardiogram,     Code Status: FULL Family Communication: no family present at time of exam Disposition Plan: Per cardiology    Consultants: Dr.Henry Malissa Hippo cardiology Dr.Jennings Harlon Ditty [facility] M   Procedure/Significant Events: 11/22 MRI brain: No acute processes 11/22 CT abdomen: Moderate to severe acute pancreatitis without fluid collection, abscess or pseudocyst. 11/25 repeat CT: Worsening diffuse pancreatitis, no abscess, no cysts 11/28 TTE: EF 30-35% w/ severe hypokinesis & anterior wall motion abnormality. 11/29 KUB: DHT tip in distal duodenum 12/02 KUB: DHT w/ tip in distal duodenum or transverse segment  SIGNIFICANT EVENTS: 11/22 -Transferred to [facility] from [facility] 11/23- BM vasal vagals, cpr 2 min , ETT placed, on presors 11/24- aline placed, major improved pressors, volume given 11/25- extubated, escallating abdo pain 11/26- resp failure, re intubated 11/26- concern NSTEMI, heparin started 11/27- neg 6.4 liters 11/30- extubated 12/01- radial arterial line removed 12/02- pulled out Dobbhoff Tube had to have it replaced   Culture BCx2 11/22: Negative   Antibiotics: Imipenem 11/24 - 11/30  DVT prophylaxis: Subcutaneous heparin and SCD   Devices    LINES / TUBES:      Continuous Infusions: . dextrose 5 % with KCl 20 mEq / L 20 mEq (12/25/14 1249)  .  feeding supplement (JEVITY 1.5 CAL/FIBER)      Objective: VITAL SIGNS: Temp: 98.6 F (37 C) (12/03 1959) Temp Source: Oral (12/03 1959) BP: 102/62 mmHg (12/03  1800) Pulse Rate: 77 (12/03 1800) SPO2; FIO2:   Intake/Output Summary (Last 24 hours) at 12/25/14 2001 Last data filed at 12/25/14 1800  Gross per 24 hour  Intake   3540 ml  Output   4295 ml  Net   -755 ml     Exam: General: A/O 4, though somewhat confused on recent events, positive acute respiratory distress (SOB/DOE) Eyes: Negative headache, eye pain, double vision,negative scleral hemorrhage ENT: Negative Runny nose, negative ear pain, negative gingival bleeding, Neck:  Negative scars, masses, torticollis, lymphadenopathy, JVD Lungs: tachypnea, decreased lung sounds from approximately mid lung down by basilar. Negative wheezes or crackles Cardiovascular: Regular rate and rhythm without murmur gallop or rub normal S1 and S2 Abdomen: Mild epigastric/LUQ abdominal pain, distended, positive soft, bowel sounds, no rebound, no ascites, no appreciable mass Extremities: No significant cyanosis, clubbing, or edema bilateral lower extremities Psychiatric:  Negative depression, negative anxiety, negative fatigue, negative mania  Neurologic:  Cranial nerves II through XII intact, tongue/uvula midline, all extremities muscle strength 5/5, sensation intact throughout, negative dysarthria, negative expressive aphasia, negative receptive aphasia.   Data Reviewed: Basic Metabolic Panel:  Recent Labs Lab 12/21/14 0500  12/22/14 0414 12/22/14 1702 12/23/14 0423 12/23/14 1400 12/24/14 0350 12/24/14 1406 12/25/14 0534 12/25/14 1528  NA 159*  < > 161* 164* 166* 166* 166* 164* 162* 157*  K 2.9*  < > 3.9 3.7 3.1* 3.1* 3.2* 3.4* 3.3* 3.0*  CL 119*  < > 125* 128* 127* 129* 125* 128* 126* 124*  CO2 28  < > 27 28 28 29 29 29 27 24   GLUCOSE 291*  < > 277* 196* 152* 199* 157* 111* 128* 178*  BUN 83*  < > 74* 67* 66* 60* 57* 48* 42* 43*  CREATININE 2.81*  < > 2.13* 1.98* 2.06* 2.11* 1.99* 1.77* 1.61* 1.59*  CALCIUM 7.1*  < > 7.4* 7.8* 7.6* 7.7* 7.6* 7.6* 7.8* 7.6*  MG 2.5*  < > 2.2 2.4 2.2  --   2.1  --  2.1  --   PHOS 4.2  --  3.5  --  3.4  --  3.3  --  3.0  --   < > = values in this interval not displayed. Liver Function Tests:  Recent Labs Lab 12/20/14 0500 12/21/14 0500 12/22/14 0414 12/23/14 0423 12/24/14 0350 12/25/14 0534  AST 22  --   --   --   --   --   ALT 22  --   --   --   --   --   ALKPHOS 50  --   --   --   --   --   BILITOT 1.0  --   --   --   --   --   PROT 5.1*  --   --   --   --   --   ALBUMIN 1.6* 1.7* 1.6* 1.6* 1.6* 1.6*   No results for input(s): LIPASE, AMYLASE in the last 168 hours. No results for input(s): AMMONIA in the last 168 hours. CBC:  Recent Labs Lab 12/21/14 0500 12/22/14 0414 12/23/14 0423 12/24/14 0533 12/25/14 0534  WBC 10.0 15.3* 16.5* 14.5* 12.9*  NEUTROABS 8.7* 14.0* 14.6* 13.2* 11.7*  HGB 11.7* 11.3* 11.0* 10.4* 10.2*  HCT 35.8* 36.4* 35.2* 35.1* 35.0*  MCV 88.6 92.6 94.1  96.4 96.7  PLT 158 173 202 183 178   Cardiac Enzymes:  Recent Labs Lab 12/18/14 2015 12/19/14 0545 12/20/14 0928 12/20/14 1554 12/20/14 2029  TROPONINI 4.41* 4.62* 4.38* 3.51* 3.72*   BNP (last 3 results)  Recent Labs  12/25/14 1230  BNP 404.0*    ProBNP (last 3 results) No results for input(s): PROBNP in the last 8760 hours.  CBG:  Recent Labs Lab 12/24/14 2353 12/25/14 0340 12/25/14 0803 12/25/14 1221 12/25/14 1611  GLUCAP 122* 125* 136* 148* 168*    Recent Results (from the past 240 hour(s))  MRSA PCR Screening     Status: None   Collection Time: 12/15/14  9:07 PM  Result Value Ref Range Status   MRSA by PCR NEGATIVE NEGATIVE Final    Comment:        The GeneXpert MRSA Assay (FDA approved for NASAL specimens only), is one component of a comprehensive MRSA colonization surveillance program. It is not intended to diagnose MRSA infection nor to guide or monitor treatment for MRSA infections.      Studies:  Recent x-ray studies have been reviewed in detail by the Attending Physician  Scheduled  Meds:  Scheduled Meds: . antiseptic oral rinse  7 mL Mouth Rinse QID  . aspirin  325 mg Oral Daily  . atorvastatin  40 mg Oral q1800  . carvedilol  12.5 mg Oral BID WC  . chlorhexidine gluconate  15 mL Mouth Rinse BID  . feeding supplement (PRO-STAT SUGAR FREE 64)  30 mL Per Tube QID  . free water  400 mL Per Tube 5 X Daily  . heparin subcutaneous  5,000 Units Subcutaneous 3 times per day  . insulin aspart  0-20 Units Subcutaneous 6 times per day  . insulin NPH Human  20 Units Subcutaneous 4 times per day  . pantoprazole sodium  40 mg Per Tube Daily    Time spent on care of this patient: 40 mins   Sydney Hasten, Roselind Messier , MD  [facility] Office  [phone] Pager - [phone]  On-Call/Text Page:      [email or web address]      password [email or web address]  If 7PM-7AM, please contact night-coverage [email or web address] Password [email or web address] 12/25/2014, 8:01 PM   LOS: 11 days   Care during the described time interval was provided by me .  I have reviewed this patient's available data, including medical history, events of note, physical examination, and all test results as part of my evaluation. I have personally reviewed and interpreted all radiology studies.   Carolyne Littles, MD [phone] Pager

## 2014-12-25 NOTE — Progress Notes (Addendum)
Nutrition Follow-up  DOCUMENTATION CODES:  Obesity unspecified  INTERVENTION: Change TF to Jev 1.5 @ 40 ml/hr via NGT   30 ml Prostat QID.    Tube feeding regimen provides 1840 kcal (99% of needs), 121 grams of protein, and 730 ml of H2O  (827 ml decreased free water)   NUTRITION DIAGNOSIS:  Inadequate oral intake related to inability to eat as evidenced by NPO status.  Ongoing   GOAL:  Patient will meet greater than or equal to 90% of their needs  Unmet with re-estimated needs  MONITOR:  Labs, TF tolerance, Diet advancement, Weight trends, I & O's  ASSESSMENT:  54 y/o male PMHx DM, CAD, Bell's Palsy who presented to hospital for abdominal pain and nausea. Found to have pancreatitis and be in DKA. Endorses c/n/v and poor PO intake. 11/23 code blue called. CPR x2 min with cpr rosc after 2 min. Intubated on 11/24 due to worsening respiratory status. Extubated 11/25. Reintubated 11/26. Cortrak placed 11/26. Patient was extubated on 11/30. Cortrak tube was pulled by patient 12/2. Replaced 12/2. 12/3 Intolerant of TF-Change to concentrated TF w/ prostat to reduce free water  Just prior to changing TF. Attending decreased TF to 40 cc. Per discussion, MD concerned about pt's fluid status. Will keep TF at 40 and use more concentrated formula w/ fiber to decrease diarrhea. Reassessed needs.   Diet Order:  Diet NPO time specified  Skin:  Ecchymosis, Rash  Last BM:  12/3-diarrhea  Height:  Ht Readings from Last 1 Encounters:  12/15/14 5\' 10"  (1.778 m)   Weight:  Wt Readings from Last 1 Encounters:  12/24/14 233 lb 0.4 oz (105.7 kg)  Dosing wt: 225 lbs (102 kg)  Ideal Body Weight:  75.45 kg  BMI:  Body mass index is 33.44 kg/(m^2).  Estimated Nutritional Needs:  Kcal:  1850-2050 kcals (18-20 kcal/kg) Protein:  106-121 grams  Fluid:  PER MD  EDUCATION NEEDS:  No education needs identified at this time  Christophe Louisathan Jamarious Febo RD, LDN Nutrition Pager: [phone] 12/25/2014 5:38  PM

## 2014-12-25 NOTE — Progress Notes (Signed)
K of 3.0 found upon initial rounds Spoke with EMD Dr Isaiah Sergemannam. Maintenance IVF with K already in place. Will f/u in AM.

## 2014-12-25 NOTE — Progress Notes (Signed)
Fentanyl PCA d/c, wasted remaining 10 ml of 40 mcg/ml PCA syringe into sink.  Witnessed by Karle BarrJames RN and Leighton ParodyBryce RN on 2MW.

## 2014-12-26 ENCOUNTER — Inpatient Hospital Stay (HOSPITAL_COMMUNITY): Payer: BLUE CROSS/BLUE SHIELD

## 2014-12-26 DIAGNOSIS — I25119 Atherosclerotic heart disease of native coronary artery with unspecified angina pectoris: Secondary | ICD-10-CM

## 2014-12-26 DIAGNOSIS — I214 Non-ST elevation (NSTEMI) myocardial infarction: Secondary | ICD-10-CM | POA: Diagnosis present

## 2014-12-26 DIAGNOSIS — I248 Other forms of acute ischemic heart disease: Secondary | ICD-10-CM | POA: Diagnosis present

## 2014-12-26 LAB — CBC WITH DIFFERENTIAL/PLATELET
Basophils Absolute: 0 10*3/uL (ref 0.0–0.1)
Basophils Relative: 0 %
EOS PCT: 1 %
Eosinophils Absolute: 0.1 10*3/uL (ref 0.0–0.7)
HCT: 32.6 % — ABNORMAL LOW (ref 39.0–52.0)
Hemoglobin: 9.7 g/dL — ABNORMAL LOW (ref 13.0–17.0)
LYMPHS ABS: 1 10*3/uL (ref 0.7–4.0)
LYMPHS PCT: 9 %
MCH: 28.4 pg (ref 26.0–34.0)
MCHC: 29.8 g/dL — AB (ref 30.0–36.0)
MCV: 95.3 fL (ref 78.0–100.0)
MONO ABS: 0.2 10*3/uL (ref 0.1–1.0)
Monocytes Relative: 2 %
Neutro Abs: 10.2 10*3/uL — ABNORMAL HIGH (ref 1.7–7.7)
Neutrophils Relative %: 88 %
Platelets: 186 10*3/uL (ref 150–400)
RBC: 3.42 MIL/uL — ABNORMAL LOW (ref 4.22–5.81)
RDW: 14.8 % (ref 11.5–15.5)
WBC: 11.4 10*3/uL — ABNORMAL HIGH (ref 4.0–10.5)

## 2014-12-26 LAB — BASIC METABOLIC PANEL
ANION GAP: 8 (ref 5–15)
BUN: 37 mg/dL — ABNORMAL HIGH (ref 6–20)
CHLORIDE: 121 mmol/L — AB (ref 101–111)
CO2: 24 mmol/L (ref 22–32)
Calcium: 7.9 mg/dL — ABNORMAL LOW (ref 8.9–10.3)
Creatinine, Ser: 1.35 mg/dL — ABNORMAL HIGH (ref 0.61–1.24)
GFR calc non Af Amer: 58 mL/min — ABNORMAL LOW (ref >60)
GLUCOSE: 130 mg/dL — AB (ref 65–99)
POTASSIUM: 3.5 mmol/L (ref 3.5–5.1)
Sodium: 153 mmol/L — ABNORMAL HIGH (ref 135–145)

## 2014-12-26 LAB — COMPREHENSIVE METABOLIC PANEL
ALBUMIN: 1.6 g/dL — AB (ref 3.5–5.0)
ALK PHOS: 60 U/L (ref 38–126)
ALT: 28 U/L (ref 17–63)
ANION GAP: 7 (ref 5–15)
AST: 33 U/L (ref 15–41)
BILIRUBIN TOTAL: 0.6 mg/dL (ref 0.3–1.2)
BUN: 38 mg/dL — AB (ref 6–20)
CALCIUM: 7.8 mg/dL — AB (ref 8.9–10.3)
CO2: 25 mmol/L (ref 22–32)
Chloride: 123 mmol/L — ABNORMAL HIGH (ref 101–111)
Creatinine, Ser: 1.47 mg/dL — ABNORMAL HIGH (ref 0.61–1.24)
GFR calc Af Amer: >60 mL/min (ref >60)
GFR calc non Af Amer: 52 mL/min — ABNORMAL LOW (ref >60)
GLUCOSE: 169 mg/dL — AB (ref 65–99)
Potassium: 3 mmol/L — ABNORMAL LOW (ref 3.5–5.1)
SODIUM: 155 mmol/L — AB (ref 135–145)
TOTAL PROTEIN: 5.5 g/dL — AB (ref 6.5–8.1)

## 2014-12-26 LAB — GLUCOSE, CAPILLARY
GLUCOSE-CAPILLARY: 117 mg/dL — AB (ref 65–99)
GLUCOSE-CAPILLARY: 137 mg/dL — AB (ref 65–99)
GLUCOSE-CAPILLARY: 138 mg/dL — AB (ref 65–99)
GLUCOSE-CAPILLARY: 151 mg/dL — AB (ref 65–99)
Glucose-Capillary: 144 mg/dL — ABNORMAL HIGH (ref 65–99)
Glucose-Capillary: 123 mg/dL — ABNORMAL HIGH (ref 65–99)
Glucose-Capillary: 118 mg/dL — ABNORMAL HIGH (ref 65–99)

## 2014-12-26 LAB — MAGNESIUM
Magnesium: 1.9 mg/dL (ref 1.7–2.4)
Magnesium: 1.9 mg/dL (ref 1.7–2.4)

## 2014-12-26 LAB — LIPASE, BLOOD: Lipase: 22 U/L (ref 11–51)

## 2014-12-26 LAB — TROPONIN I: Troponin I: 0.34 ng/mL — ABNORMAL HIGH (ref <0.031)

## 2014-12-26 LAB — PHOSPHORUS: Phosphorus: 3.5 mg/dL (ref 2.5–4.6)

## 2014-12-26 MED ORDER — POTASSIUM CL IN DEXTROSE 5% 20 MEQ/L IV SOLN
20.0000 meq | INTRAVENOUS | Status: DC
  Administered 2014-12-26 – 2014-12-28 (×4): 20 meq via INTRAVENOUS
  Filled 2014-12-26 (×6): qty 1000

## 2014-12-26 MED ORDER — POTASSIUM CHLORIDE 10 MEQ/100ML IV SOLN
10.0000 meq | INTRAVENOUS | Status: AC
  Administered 2014-12-26 (×2): 10 meq via INTRAVENOUS
  Filled 2014-12-26 (×4): qty 100

## 2014-12-26 MED ORDER — POTASSIUM CHLORIDE 10 MEQ/100ML IV SOLN
10.0000 meq | INTRAVENOUS | Status: AC
  Administered 2014-12-26: 10 meq via INTRAVENOUS
  Filled 2014-12-26: qty 100

## 2014-12-26 MED ORDER — FUROSEMIDE 10 MG/ML IJ SOLN
20.0000 mg | Freq: Once | INTRAMUSCULAR | Status: DC
  Filled 2014-12-26 (×2): qty 2

## 2014-12-26 MED ORDER — POTASSIUM CHLORIDE 10 MEQ/100ML IV SOLN
10.0000 meq | INTRAVENOUS | Status: DC
  Administered 2014-12-26: 10 meq via INTRAVENOUS
  Filled 2014-12-26: qty 100

## 2014-12-26 NOTE — Progress Notes (Signed)
[place] TEAM 1 - Stepdown/ICU TEAM Progress Note  Brent ShamesDaniel J Pollio ZOX:[id number] DOB: 1960-06-24 DOA: 12/14/2014 PCP: No primary care provider on file.  Admit HPI / Brief Narrative: Brent Rasmussen is a 54 y.o. WM PMHx Diabetes Type 2, HLD, CAD Native Artery, Asthma, Bell's Palsy   Presented to [facility] for abdominal pain. He was found to have pancreatitis and be in DKA. There he was provided 3.5 L bolus. He was started on an insulin drip and was [facility].  HPI/Subjective: 12/4 A/O 4 , Mild abdominal pain, negative N/V, positive SOB, positive DOE. States is been worsening since hospitalization. Had cardiologist at [facility] but retired.   Assessment/Plan: Severe pancreatitis/Shock: -Shock resolved - Likely secondary to a combination of DKA & hypertriglyceridemia. Completed 7 day course of imipenem. -Continuing NPO except ice chips. -Tolerating postpyloric tube feeds. Would consult GI in Am on proper time to start PO feeding  -Continuing treatment of pain with fentanyl PCA.  Acute hypoxic respiratory failure: -Multifactorial to include severe shock, NSTEMI, fluid overload and acute systolic CHF -  Decrease fluid load -Continue albuterol QID PRN -12/4 PCXR; Lt Pleural Effusion?  SOB - Improving w/ decrease in positive Volume load  -Lasix IV 20mg  x 1(Fluid Overload)  NSTEMI/demand ischemia:  -Cardiology following. Per cardiology note will appear to need cardiac catheterization once more stable -Ejection fraction 30-35%. Known history of coronary artery disease. -Strict I&OSince admission; + 9.2 L -Daily weight; on admission 104.3 kg            12/4 weight= 105.9 kg   -Continuing Coreg, aspirin, & Lipitor. Avoiding IV heparin given potential for hemorrhagic transformation of pancreatitis.  ADDENDUM Chest Pain; approximately 1900 patient started complaining of chest pain EKG no change from EKG on 11/29 -First troponin mildly positive  however trended down, continue to trend. Do not believe any further injury to cardiac muscle. Patient already requires cardiac catheterization prior to discharge. -May also be referred pain from pancreatitis   Acute systolic CHF -See NSTEMI -Nutrition changed to  Jev 1.5 @ 5340ml/hr; to decrease Fluid load. (Pt Fluid overload)  Acute renal failure:  -Slowly continuing to improve.  -Continuing to monitor renal output as well as trending daily BUN/creatinine.  Hypernatremia (peaked at 166):  -Slowly improving. Continuing free water via Dobbhoff tube.  -Continue D5W w/ 20 mEq KCl at 50 mL per hour. (Patient fluid overload)  -Continue Free water 400 ml 5 x daily  Trending electrolytes every 12 hours.   Hypokalemia -See hypernatremia -Potassium goal> 4 -Potassium 50 mEq 1  DM Type 2  -Hemoglobin A1c pending  -Lipid panel pending   Toxic metabolic encephalopathy:  -Improving, patient A/O 4 does not recall receiving echocardiogram,     Code Status: FULL Family Communication: no family present at time of exam Disposition Plan: Per cardiology    Consultants: Dr.Henry Malissa HippoW Smith cardiology Dr.Jennings Harlon Ditty Nestor [facility]   Procedure/Significant Events: 11/22 MRI brain: No acute processes 11/22 CT abdomen: Moderate to severe acute pancreatitis without fluid collection, abscess or pseudocyst. 11/25 repeat CT: Worsening diffuse pancreatitis, no abscess, no cysts 11/28 TTE: EF 30-35% w/ severe hypokinesis & anterior wall motion abnormality. 11/29 KUB: DHT tip in distal duodenum 12/02 KUB: DHT w/ tip in distal duodenum or transverse segment  SIGNIFICANT EVENTS: 11/22 -[facility] 11/23- BM vasal vagals, cpr 2 min , ETT placed, on presors 11/24- aline placed, major improved pressors, volume given  11/25- extubated, escallating abdo pain 11/26- resp failure, re intubated 11/26- concern NSTEMI, heparin started 11/27- neg 6.4 liters 11/30-  extubated 12/01- radial arterial line removed 12/02- pulled out Dobbhoff Tube had to have it replaced   Culture BCx2 11/22: Negative   Antibiotics: Imipenem 11/24 - 11/30  DVT prophylaxis: Subcutaneous heparin and SCD   Devices    LINES / TUBES:      Continuous Infusions: . dextrose 5 % with KCl 20 mEq / L 20 mEq (12/26/14 1946)  . feeding supplement (JEVITY 1.5 CAL/FIBER) 1,000 mL (12/25/14 2000)    Objective: VITAL SIGNS: Temp: 98.7 F (37.1 C) (12/04 1721) Temp Source: Oral (12/04 1721) BP: 123/70 mmHg (12/04 1721) Pulse Rate: 79 (12/04 1721) SPO2; FIO2:   Intake/Output Summary (Last 24 hours) at 12/26/14 2100 Last data filed at 12/26/14 1300  Gross per 24 hour  Intake   1895 ml  Output   1250 ml  Net    645 ml     Exam: General: A/O 4, though somewhat confused on recent events, positive acute respiratory distress (SOB/DOE) Eyes: Negative headache, eye pain, double vision,negative scleral hemorrhage ENT: Negative Runny nose, negative ear pain, negative gingival bleeding, Neck:  Negative scars, masses, torticollis, lymphadenopathy, JVD Lungs: tachypnea, decreased lung sounds from approximately mid lung down by basilar. Negative wheezes or crackles Cardiovascular: Regular rate and rhythm without murmur gallop or rub normal S1 and S2 Abdomen: Mild epigastric/LUQ abdominal pain, distended, positive soft, bowel sounds, no rebound, no ascites, no appreciable mass Extremities: No significant cyanosis, clubbing, or edema bilateral lower extremities Psychiatric:  Negative depression, negative anxiety, negative fatigue, negative mania  Neurologic:  Cranial nerves II through XII intact, tongue/uvula midline, all extremities muscle strength 5/5, sensation intact throughout, negative dysarthria, negative expressive aphasia, negative receptive aphasia.   Data Reviewed: Basic Metabolic Panel:  Recent Labs Lab 12/22/14 0414  12/23/14 0423  12/24/14 0350  12/24/14 1406 12/25/14 0534 12/25/14 1528 12/26/14 0518 12/26/14 2007  NA 161*  < > 166*  < > 166* 164* 162* 157* 155* 153*  K 3.9  < > 3.1*  < > 3.2* 3.4* 3.3* 3.0* 3.0* 3.5  CL 125*  < > 127*  < > 125* 128* 126* 124* 123* 121*  CO2 27  < > 28  < > GLUCOSE 277*  < > 152*  < > 157* 111* 128* 178* 169* 130*  BUN 74*  < > 66*  < > 57* 48* 42* 43* 38* 37*  CREATININE 2.13*  < > 2.06*  < > 1.99* 1.77* 1.61* 1.59* 1.47* 1.35*  CALCIUM 7.4*  < > 7.6*  < > 7.6* 7.6* 7.8* 7.6* 7.8* 7.9*  MG 2.2  < > 2.2  --  2.1  --  2.1  --  1.9 1.9  PHOS 3.5  --  3.4  --  3.3  --  3.0  --  3.5  --   < > = values in this interval not displayed. Liver Function Tests:  Recent Labs Lab 12/20/14 0500  12/22/14 0414 12/23/14 0423 12/24/14 0350 12/25/14 0534 12/26/14 0518  AST 22  --   --   --   --   --  33  ALT 22  --   --   --   --   --  28  ALKPHOS 50  --   --   --   --   --  60  BILITOT 1.0  --   --   --   --   --  0.6  PROT 5.1*  --   --   --   --   --  5.5*  ALBUMIN 1.6*  < > 1.6* 1.6* 1.6* 1.6* 1.6*  < > = values in this interval not displayed.  Recent Labs Lab 12/26/14 0518  LIPASE 22   No results for input(s): AMMONIA in the last 168 hours. CBC:  Recent Labs Lab 12/22/14 0414 12/23/14 0423 12/24/14 0533 12/25/14 0534 12/26/14 0518  WBC 15.3* 16.5* 14.5* 12.9* 11.4*  NEUTROABS 14.0* 14.6* 13.2* 11.7* 10.2*  HGB 11.3* 11.0* 10.4* 10.2* 9.7*  HCT 36.4* 35.2* 35.1* 35.0* 32.6*  MCV 92.6 94.1 96.4 96.7 95.3  PLT 173 202 183 178 186   Cardiac Enzymes:  Recent Labs Lab 12/20/14 0928 12/20/14 1554 12/20/14 2029 12/26/14 2007  TROPONINI 4.38* 3.51* 3.72* 0.34*   BNP (last 3 results)  Recent Labs  12/25/14 1230  BNP 404.0*    ProBNP (last 3 results) No results for input(s): PROBNP in the last 8760 hours.  CBG:  Recent Labs Lab 12/26/14 0359 12/26/14 0827 12/26/14 1241 12/26/14 1719 12/26/14 2000  GLUCAP 137* 144* 123* 151* 117*    No  results found for this or any previous visit (from the past 240 hour(s)).   Studies:  Recent x-ray studies have been reviewed in detail by the Attending Physician  Scheduled Meds:  Scheduled Meds: . antiseptic oral rinse  7 mL Mouth Rinse QID  . aspirin  325 mg Oral Daily  . atorvastatin  40 mg Oral q1800  . carvedilol  12.5 mg Oral BID WC  . chlorhexidine gluconate  15 mL Mouth Rinse BID  . feeding supplement (PRO-STAT SUGAR FREE 64)  30 mL Per Tube QID  . free water  400 mL Per Tube 5 X Daily  . furosemide  20 mg Intravenous Once  . heparin subcutaneous  5,000 Units Subcutaneous 3 times per day  . insulin aspart  0-20 Units Subcutaneous 6 times per day  . insulin NPH Human  20 Units Subcutaneous 4 times per day  . pantoprazole sodium  40 mg Per Tube Daily  . potassium chloride  10 mEq Intravenous Q1 Hr x 4    Time spent on care of this patient: 40 mins   WOODS, Roselind Messier , MD  [facility] Office  [phone] Pager [phone]  On-Call/Text Page:      [email or web address]      password [email or web address]  If 7PM-7AM, please contact night-coverage [email or web address] Password [email or web address] 12/26/2014, 9:00 PM   LOS: 12 days   Care during the described time interval was provided by me .  I have reviewed this patient's available data, including medical history, events of note, physical examination, and all test results as part of my evaluation. I have personally reviewed and interpreted all radiology studies.   Carolyne Littles, MD [phone] Pager

## 2014-12-26 NOTE — Progress Notes (Signed)
Report called to nurse. Patient is stable.Vitals Bp 110/66 Hr 81

## 2014-12-26 NOTE — Progress Notes (Signed)
eLink Physician-Brief Progress Note Patient Name: Brent ShamesDaniel J Warehime DOB: Aug 13, 1960 MRN: [id number]   Date of Service  12/26/2014  HPI/Events of Note  K-3  eICU Interventions  Replaced 20 meq     Intervention Category Minor Interventions: Electrolytes abnormality - evaluation and management  Dorothyann Gibbsrag Rozanne Heumann 12/26/2014, 6:43 AM

## 2014-12-26 NOTE — Progress Notes (Addendum)
       Patient Name: Brent ShamesDaniel J Rasmussen Date of Encounter: 12/26/2014    SUBJECTIVE: No specific cardiac complaints.  TELEMETRY:  Normal sinus rhythm on telemetry. Filed Vitals:   12/26/14 0600 12/26/14 0700 12/26/14 0825 12/26/14 0900  BP: 107/64 109/58  100/58  Pulse: 72 68  80  Temp:   99.1 F (37.3 C)   TempSrc:   Oral   Resp: 31 29    Height:      Weight:      SpO2: 97% 100%      Intake/Output Summary (Last 24 hours) at 12/26/14 0948 Last data filed at 12/26/14 0700  Gross per 24 hour  Intake   2930 ml  Output   4970 ml  Net  -2040 ml   LABS: Basic Metabolic Panel:  Recent Labs  47/82/9510/04/07 0534 12/25/14 1528 12/26/14 0518  NA 162* 157* 155*  K 3.3* 3.0* 3.0*  CL 126* 124* 123*  CO2 27 24 25   GLUCOSE 128* 178* 169*  BUN 42* 43* 38*  CREATININE 1.61* 1.59* 1.47*  CALCIUM 7.8* 7.6* 7.8*  MG 2.1  --  1.9  PHOS 3.0  --  3.5   CBC:  Recent Labs  12/25/14 0534 12/26/14 0518  WBC 12.9* 11.4*  NEUTROABS 11.7* 10.2*  HGB 10.2* 9.7*  HCT 35.0* 32.6*  MCV 96.7 95.3  PLT 178 186   Cardiac Enzymes: No results for input(s): CKTOTAL, CKMB, CKMBINDEX, TROPONINI in the last 72 hours. BNP: Invalid input(s): POCBNP Hemoglobin A1C: No results for input(s): HGBA1C in the last 72 hours. Fasting Lipid Panel:  Recent Labs  12/25/14 1652  CHOL 97  HDL 15*  LDLCALC 47  TRIG [id number]*  CHOLHDL 6.5   BNP    Component Value Date/Time   BNP 404.0* 12/25/2014 1230    ProBNP No results found for: PROBNP   Radiology/Studies:  No new data  Physical Exam: Blood pressure 100/58, pulse 80, temperature 99.1 F (37.3 C), temperature source Oral, resp. rate 29, height 5\' 10"  (1.778 m), weight 233 lb 7.5 oz (105.9 kg), SpO2 100 %. Weight change:   Wt Readings from Last 3 Encounters:  12/26/14 233 lb 7.5 oz (105.9 kg)    Sitting at bedside. NG tube still in place. Basilar crackles bilaterally Cardiac exam reveals no gallop or rub No peripheral  edema  ASSESSMENT:  1. Acute on possibly chronic systolic heart failure. No evidence of volume overload. Note BNP is acceptable at 404. 2. History of ischemic heart disease with significant troponin elevations during this hospital stay. No anginal complaints. 3. Acute kidney injury, improving to net baseline. 4. Pancreatitis, improving 5. DKA, improved  Plan:  1. Continue conservative management for the possibility of worsening ischemic heart disease. Plan coronary angiography at some point either during this admission are as 0P when he is stronger. 2. Continue active anti-ischemic therapy including aspirin, beta blocker, and consider long-acting nitrates as tolerated by blood pressure. 3. We will continue to follow.  Brent EonSigned, Brent Rasmussen,Brent Rasmussen W 12/26/2014, 9:48 AM

## 2014-12-27 LAB — COMPREHENSIVE METABOLIC PANEL
ALBUMIN: 1.6 g/dL — AB (ref 3.5–5.0)
ALT: 38 U/L (ref 17–63)
AST: 43 U/L — AB (ref 15–41)
Alkaline Phosphatase: 73 U/L (ref 38–126)
Anion gap: 8 (ref 5–15)
BILIRUBIN TOTAL: 0.4 mg/dL (ref 0.3–1.2)
BUN: 32 mg/dL — AB (ref 6–20)
CO2: 22 mmol/L (ref 22–32)
Calcium: 7.6 mg/dL — ABNORMAL LOW (ref 8.9–10.3)
Chloride: 119 mmol/L — ABNORMAL HIGH (ref 101–111)
Creatinine, Ser: 1.44 mg/dL — ABNORMAL HIGH (ref 0.61–1.24)
GFR calc Af Amer: >60 mL/min (ref >60)
GFR calc non Af Amer: 54 mL/min — ABNORMAL LOW (ref >60)
GLUCOSE: 211 mg/dL — AB (ref 65–99)
POTASSIUM: 3.2 mmol/L — AB (ref 3.5–5.1)
Sodium: 149 mmol/L — ABNORMAL HIGH (ref 135–145)
TOTAL PROTEIN: 5.5 g/dL — AB (ref 6.5–8.1)

## 2014-12-27 LAB — CBC WITH DIFFERENTIAL/PLATELET
BASOS ABS: 0.1 10*3/uL (ref 0.0–0.1)
BASOS PCT: 1 %
Eosinophils Absolute: 0.1 10*3/uL (ref 0.0–0.7)
Eosinophils Relative: 1 %
HEMATOCRIT: 30.9 % — AB (ref 39.0–52.0)
HEMOGLOBIN: 9.5 g/dL — AB (ref 13.0–17.0)
Lymphocytes Relative: 9 %
Lymphs Abs: 1 10*3/uL (ref 0.7–4.0)
MCH: 28.6 pg (ref 26.0–34.0)
MCHC: 30.7 g/dL (ref 30.0–36.0)
MCV: 93.1 fL (ref 78.0–100.0)
MONO ABS: 0.3 10*3/uL (ref 0.1–1.0)
Monocytes Relative: 2 %
NEUTROS ABS: 9.5 10*3/uL — AB (ref 1.7–7.7)
NEUTROS PCT: 87 %
Platelets: 190 10*3/uL (ref 150–400)
RBC: 3.32 MIL/uL — AB (ref 4.22–5.81)
RDW: 14.2 % (ref 11.5–15.5)
WBC: 10.9 10*3/uL — AB (ref 4.0–10.5)

## 2014-12-27 LAB — GLUCOSE, CAPILLARY
GLUCOSE-CAPILLARY: 168 mg/dL — AB (ref 65–99)
GLUCOSE-CAPILLARY: 145 mg/dL — AB (ref 65–99)
Glucose-Capillary: 168 mg/dL — ABNORMAL HIGH (ref 65–99)
Glucose-Capillary: 155 mg/dL — ABNORMAL HIGH (ref 65–99)
Glucose-Capillary: 155 mg/dL — ABNORMAL HIGH (ref 65–99)

## 2014-12-27 LAB — RENAL FUNCTION PANEL
ALBUMIN: 1.6 g/dL — AB (ref 3.5–5.0)
ANION GAP: 7 (ref 5–15)
BUN: 34 mg/dL — ABNORMAL HIGH (ref 6–20)
CO2: 24 mmol/L (ref 22–32)
Calcium: 7.5 mg/dL — ABNORMAL LOW (ref 8.9–10.3)
Chloride: 121 mmol/L — ABNORMAL HIGH (ref 101–111)
Creatinine, Ser: 1.37 mg/dL — ABNORMAL HIGH (ref 0.61–1.24)
GFR calc Af Amer: >60 mL/min (ref >60)
GFR, EST NON AFRICAN AMERICAN: 57 mL/min — AB (ref >60)
Glucose, Bld: 157 mg/dL — ABNORMAL HIGH (ref 65–99)
PHOSPHORUS: 3.3 mg/dL (ref 2.5–4.6)
POTASSIUM: 3.1 mmol/L — AB (ref 3.5–5.1)
Sodium: 152 mmol/L — ABNORMAL HIGH (ref 135–145)

## 2014-12-27 LAB — C DIFFICILE QUICK SCREEN W PCR REFLEX
C DIFFICILE (CDIFF) INTERP: NEGATIVE
C DIFFICILE (CDIFF) TOXIN: NEGATIVE
C Diff antigen: NEGATIVE

## 2014-12-27 LAB — MAGNESIUM: MAGNESIUM: 1.9 mg/dL (ref 1.7–2.4)

## 2014-12-27 LAB — TROPONIN I
Troponin I: 0.26 ng/mL — ABNORMAL HIGH (ref <0.031)
Troponin I: 0.57 ng/mL (ref <0.031)

## 2014-12-27 LAB — HEMOGLOBIN A1C
HEMOGLOBIN A1C: 10.9 % — AB (ref 4.8–5.6)
Mean Plasma Glucose: 266 mg/dL

## 2014-12-27 MED ORDER — LOPERAMIDE HCL 2 MG PO CAPS
2.0000 mg | ORAL_CAPSULE | ORAL | Status: DC | PRN
  Administered 2014-12-27 – 2014-12-31 (×3): 2 mg via ORAL
  Filled 2014-12-27 (×4): qty 1

## 2014-12-27 MED ORDER — DIPHENHYDRAMINE HCL 50 MG/ML IJ SOLN
12.5000 mg | Freq: Once | INTRAMUSCULAR | Status: AC
  Administered 2014-12-27: 12.5 mg via INTRAVENOUS
  Filled 2014-12-27: qty 1

## 2014-12-27 NOTE — Progress Notes (Signed)
CRITICAL VALUE ALERT  Critical value received:  Troponin 0.57  Date of notification:  12/27/2014  Time of notification:  0715  Critical value read back:Yes.    Nurse who received alert: Lora Paulaarolyn white RN   MD notified (1st page):  Dr. Sharon SellerMcClung  Time of first page:  0718  MD notified (2nd page):  Time of second page:  Responding MD:    Time MD responded:

## 2014-12-27 NOTE — Progress Notes (Signed)
Physical Therapy Treatment Patient Details Name: Brent Rasmussen MRN: [id number] DOB: October 02, 1960 Today's Date: 12/27/2014    History of Present Illness Patient is a 54 y/o male who presents with abdominal pain and found to have pancreatitis and be in DKA. s/p cardiac arrest 11/23, intubated x2, concern for NSTEMI, extubated 11/30. PMH includes T2DM, CAD (prior stent 5-10 years ago at [facility]) and asthma.     PT Comments    Pt admitted with above diagnosis. Pt currently with functional limitations due to balance and endurance deficits.Pt needed min to min guard assist due to impulsivity as well as fatigues quickly.  Pt still needs NHP.   Pt will benefit from skilled PT to increase their independence and safety with mobility to allow discharge to the venue listed below.    Follow Up Recommendations  SNF     Equipment Recommendations  Other (comment) (TBA)    Recommendations for Other Services       Precautions / Restrictions Precautions Precautions: Fall Precaution Comments: NG tube Restrictions Weight Bearing Restrictions: No    Mobility  Bed Mobility               General bed mobility comments: in chair on arrival  Transfers Overall transfer level: Needs assistance Equipment used: Rolling walker (2 wheeled) Transfers: Sit to/from Stand Sit to Stand: Min guard         General transfer comment: Pt impulsively stands and without cues.  Somewhat unsteady needing controlled descent into chair.   Sit to stand to wash his perineal area as he was adamant he wanted to take a bath.  Pt was able to wash his penis but could not adequately clean his buttocks and needed total assist by PT to clean that.  Pt was able to stand with RW with min guard assist for up to 45 xseconds.  Sits abruptly without warning however this decr safety.  Pt then finished entire bath except for feet and this PT finished assisting pt with his feet.   Ambulation/Gait             General Gait  Details: Pt too fatigued after sponge bathing himself.  Wanted to rest.     Stairs            Wheelchair Mobility    Modified Rankin (Stroke Patients Only)       Balance Overall balance assessment: Needs assistance         Standing balance support: Bilateral upper extremity supported;During functional activity Standing balance-Leahy Scale: Poor Standing balance comment: requires UE support bil UEs.                     Cognition Arousal/Alertness: Awake/alert Behavior During Therapy: Impulsive Overall Cognitive Status: Within Functional Limits for tasks assessed                      Exercises General Exercises - Lower Extremity Ankle Circles/Pumps: Both;15 reps;Seated Long Arc Quad: AROM;Both;10 reps;Seated    General Comments        Pertinent Vitals/Pain Pain Assessment: Faces Faces Pain Scale: Hurts little more Pain Location: abdomen Pain Descriptors / Indicators: Aching;Grimacing;Guarding Pain Intervention(s): Limited activity within patient's tolerance;Monitored during session;Repositioned  VSS    Home Living                      Prior Function            PT Goals (current goals  can now be found in the care plan section) Progress towards PT goals: Progressing toward goals (slowly)    Frequency  Min 3X/week    PT Plan Current plan remains appropriate    Co-evaluation             End of Session Equipment Utilized During Treatment: Gait belt Activity Tolerance: Patient limited by fatigue Patient left: in chair;with call bell/phone within reach;with chair alarm set     Time: [id number] PT Time Calculation (min) (ACUTE ONLY): 24 min  Charges:  $Therapeutic Activity: 8-22 mins $Self Care/Home Management: 8-22                    G CodesBerline Lopes:      Latissa Frick F 12/27/2014, 1:04 PM  Robbie Rideaux,PT Acute Rehabilitation [phone] (pager)

## 2014-12-27 NOTE — Progress Notes (Signed)
Utilization review complete. Tesha Archambeau RN CCM Case Mgmt phone [phone] 

## 2014-12-27 NOTE — Progress Notes (Signed)
Patient Name: Brent Rasmussen Date of Encounter: 12/27/2014    SUBJECTIVE: No specific cardiac complaints. Breathing is better than yesterday. Still having frequent loose stools. Wants to come off tube feeds.  TELEMETRY:  Normal sinus rhythm on telemetry. Filed Vitals:   12/27/14 0320 12/27/14 0343 12/27/14 0600 12/27/14 0700  BP:  112/75  110/65  Pulse:  81  90  Temp:  98.3 F (36.8 C)  98.8 F (37.1 C)  TempSrc:  Oral    Resp:  33  23  Height:      Weight: 238 lb 15.7 oz (108.4 kg)     SpO2:  96% 99% 76%    Intake/Output Summary (Last 24 hours) at 12/27/14 0815 Last data filed at 12/27/14 0600  Gross per 24 hour  Intake 2300.83 ml  Output   1100 ml  Net 1200.83 ml   LABS: Basic Metabolic Panel:  Recent Labs  16/10/96 0518 12/26/14 2007 12/27/14 0227 12/27/14 0620  NA 155* 153* 152* 149*  K 3.0* 3.5 3.1* 3.2*  CL 123* 121* 121* 119*  CO2 GLUCOSE 169* 130* 157* 211*  BUN 38* 37* 34* 32*  CREATININE 1.47* 1.35* 1.37* 1.44*  CALCIUM 7.8* 7.9* 7.5* 7.6*  MG 1.9 1.9  --  1.9  PHOS 3.5  --  3.3  --    CBC:  Recent Labs  12/26/14 0518 12/27/14 0620  WBC 11.4* 10.9*  NEUTROABS 10.2* 9.5*  HGB 9.7* 9.5*  HCT 32.6* 30.9*  MCV 95.3 93.1  PLT 186 190   Cardiac Enzymes:  Recent Labs  12/26/14 2007 12/27/14 0227 12/27/14 0620  TROPONINI 0.34* 0.26* 0.57*   BNP: Invalid input(s): POCBNP Hemoglobin A1C: No results for input(s): HGBA1C in the last 72 hours. Fasting Lipid Panel:  Recent Labs  12/25/14 1652  CHOL 97  HDL 15*  LDLCALC 47  TRIG 045*  CHOLHDL 6.5   BNP    Component Value Date/Time   BNP 404.0* 12/25/2014 1230    ProBNP No results found for: PROBNP   Radiology/Studies:  No new data  Physical Exam: Blood pressure 110/65, pulse 90, temperature 98.8 F (37.1 C), temperature source Oral, resp. rate 23, height  (1.778 m), weight 238 lb 15.7 oz (108.4 kg), SpO2 76 %. Weight change: 5 lb 8.2 oz (2.5  kg)  Wt Readings from Last 3 Encounters:  12/27/14 238 lb 15.7 oz (108.4 kg)    Supine in bed. NG tube still in place. Basilar crackles bilaterally, no wheezes Cardiac exam reveals no gallop or rub, RRR No peripheral edema  ASSESSMENT:  1. Acute on possibly chronic systolic heart failure. 2. History of ischemic heart disease with significant troponin elevations during this hospital stay. No anginal complaints. 3. Acute kidney injury, improving to net baseline. 4. Pancreatitis, improving 5. DKA, improved  Plan:  1. Continue conservative management for the possibility of worsening ischemic heart disease. Plan coronary angiography at some     point either during this admission. 2. Continue active anti-ischemic therapy including aspirin, beta blocker, and consider long-acting nitrates as tolerated by blood     pressure. 3. BNP was 404 yesterday, given 20 mg IV lasix x 1. He is +9L, but given diarrhea at this point and     benign exam not suggestive of significant pulmonary edema, would be hesitant to aggressively diurese at this point. 4. We will continue to follow.  Chrystie Nose, MD, [facility] Attending Cardiologist [facility]  Chrystie NoseKenneth C Zeppelin Commisso 12/27/2014, 8:15 AM

## 2014-12-27 NOTE — Progress Notes (Signed)
[place] TEAM 1 - Stepdown/ICU TEAM PROGRESS NOTE  Brent Rasmussen ZOX:[id number] DOB: 1960/05/12 DOA: 12/14/2014 PCP: No primary care provider on file.  Admit HPI / Brief Narrative: 54 y.o. M Hx DM2, HLD, CAD, Asthma, and Bell's Palsy who presented to [facility] for abdominal pain. He was found to have pancreatitis and was in DKA. He was provided 3.5 L bolus. He was started on an insulin drip and [facility]  Significant Events: 11/22 -[facility] 11/22 MRI brain: No acute processes 11/22 CT abdomen: Moderate to severe acute pancreatitis without fluid collection, abscess or pseudocyst. 11/23- with bowel movement became vaso-vagal, passed out, and reportedly had no pulse, cpr 2 min , ETT placed, on presors 11/24 early AM - intubated  11/25- extubated, escallating abdo pain 11/25 repeat CT: Worsening diffuse pancreatitis, no abscess, no cysts 11/26- resp failure, re intubated 11/26- concern NSTEMI, heparin started 11/28 TTE: EF 30-35% w/ severe hypokinesis & anterior wall motion abnormality 11/29 KUB: DHT tip in distal duodenum 11/30- extubated 12/01- radial arterial line removed 12/02 KUB: DHT w/ tip in distal duodenum or transverse segment 12/02- pulled out Dobbhoff Tube had to have it replaced  HPI/Subjective: The patient is sitting up in a bedside chair.  He is complaining that his NG tube is irritating him and asked that it be removed.  He denies current epigastric pain.  He has a good appetite and is anxious to begin clear liquids.  He denies shortness of breath headache or chest pain.  Assessment/Plan:  Severe pancreatitis / Shock -Shock resolved -Clinically the patient is progressing well - he has a good appetite and is anxious to begin oral intake - he is very anxious to have his NG tube removed - I will give him a trial of clear liquids only - if he tolerates this I will discontinue his tube feeds and remove his NG - we will  be very slow and progressing his diet as tolerated and will consider increasing to full liquids 12/6 if he does well overnight  Acute hypoxic respiratory failure -Multifactorial to include severe shock, NSTEMI, fluid overload and acute systolic CHF -Clinically much improved with greatly diminished oxygen requirement - follow  NSTEMI / demand ischemia  -Cardiology following - will need cardiac catheterization once more stable  Acute on ?chronic systolic CHF -Ejection fraction 30-35% -Cardiology directing care   Acute renal failure -crt appears to have reached a nadir at ~1.4 - follow trend   Hypernatremia -Slowly improving - follow closely w/ d/c of NG tube  Hypokalemia -cont to replace as indicated   DM2  -A1c 10.9 - CBG currently reasonably controlled - follow w/o change    Toxic metabolic encephalopathy -Improving - patient A/O 4 today   Diarrhea  -C diff negative - likely related to tube feeding - follow w/ d/c of tube feeds today   Obesity   Code Status: FULL Family Communication: no family present at time of exam Disposition Plan: SDU   [facility]  P[facility]  Antibiotics: None currently   DVT prophylaxis: SQ heparin   Objective: Blood pressure 110/68, pulse 84, temperature 98.8 F (37.1 C), temperature source Oral, resp. rate 23, height 5\' 10"  (1.778 m), weight 108.4 kg (238 lb 15.7 oz), SpO2 76 %.  Intake/Output Summary (Last 24 hours) at 12/27/14 1023 Last data filed at 12/27/14 0600  Gross per 24 hour  Intake 2070.83 ml  Output   1100 ml  Net  970.83 ml   Exam: General: No acute respiratory distress Lungs: Clear to auscultation bilaterally w/ exception to mild bibasilar crackles - no wheeze  Cardiovascular: Regular rate and rhythm without murmur gallop or rub  Abdomen: Nontender, protuberent, soft, bowel sounds positive, no rebound, no ascites, no appreciable mass Extremities: No significant cyanosis, clubbing, or edema bilateral  lower extremities  Data Reviewed: Basic Metabolic Panel:  Recent Labs Lab 12/23/14 0423  12/24/14 0350  12/25/14 0534 12/25/14 1528 12/26/14 0518 12/26/14 2007 12/27/14 0227 12/27/14 0620  NA 166*  < > 166*  < > 162* 157* 155* 153* 152* 149*  K 3.1*  < > 3.2*  < > 3.3* 3.0* 3.0* 3.5 3.1* 3.2*  CL 127*  < > 125*  < > 126* 124* 123* 121* 121* 119*  CO2 28  < > 29  < > GLUCOSE 152*  < > 157*  < > 128* 178* 169* 130* 157* 211*  BUN 66*  < > 57*  < > 42* 43* 38* 37* 34* 32*  CREATININE 2.06*  < > 1.99*  < > 1.61* 1.59* 1.47* 1.35* 1.37* 1.44*  CALCIUM 7.6*  < > 7.6*  < > 7.8* 7.6* 7.8* 7.9* 7.5* 7.6*  MG 2.2  --  2.1  --  2.1  --  1.9 1.9  --  1.9  PHOS 3.4  --  3.3  --  3.0  --  3.5  --  3.3  --   < > = values in this interval not displayed.  CBC:  Recent Labs Lab 12/23/14 0423 12/24/14 0533 12/25/14 0534 12/26/14 0518 12/27/14 0620  WBC 16.5* 14.5* 12.9* 11.4* 10.9*  NEUTROABS 14.6* 13.2* 11.7* 10.2* 9.5*  HGB 11.0* 10.4* 10.2* 9.7* 9.5*  HCT 35.2* 35.1* 35.0* 32.6* 30.9*  MCV 94.1 96.4 96.7 95.3 93.1  PLT 202 183 178 186 190    Liver Function Tests:  Recent Labs Lab 12/24/14 0350 12/25/14 0534 12/26/14 0518 12/27/14 0227 12/27/14 0620  AST  --   --  33  --  43*  ALT  --   --  28  --  38  ALKPHOS  --   --  60  --  73  BILITOT  --   --  0.6  --  0.4  PROT  --   --  5.5*  --  5.5*  ALBUMIN 1.6* 1.6* 1.6* 1.6* 1.6*    Recent Labs Lab 12/26/14 0518  LIPASE 22    Cardiac Enzymes:  Recent Labs Lab 12/20/14 1554 12/20/14 2029 12/26/14 2007 12/27/14 0227 12/27/14 0620  TROPONINI 3.51* 3.72* 0.34* 0.26* 0.57*    CBG:  Recent Labs Lab 12/26/14 1639 12/26/14 1719 12/26/14 2000 12/26/14 2323 12/27/14 0344  GLUCAP 168* 151* 117* 118* 168*    Recent Results (from the past 240 hour(s))  C difficile quick scan w PCR reflex     Status: None   Collection Time: 12/27/14  6:00 AM  Result Value Ref Range Status   C Diff antigen  NEGATIVE NEGATIVE Final   C Diff toxin NEGATIVE NEGATIVE Final   C Diff interpretation Negative for toxigenic C. difficile  Final     Studies:   Recent x-ray studies have been reviewed in detail by the Attending Physician  Scheduled Meds:  Scheduled Meds: . antiseptic oral rinse  7 mL Mouth Rinse QID  . aspirin  325 mg Oral Daily  . atorvastatin  40 mg Oral q1800  .  carvedilol  12.5 mg Oral BID WC  . chlorhexidine gluconate  15 mL Mouth Rinse BID  . feeding supplement (PRO-STAT SUGAR FREE 64)  30 mL Per Tube QID  . free water  400 mL Per Tube 5 X Daily  . furosemide  20 mg Intravenous Once  . heparin subcutaneous  5,000 Units Subcutaneous 3 times per day  . insulin aspart  0-20 Units Subcutaneous 6 times per day  . insulin NPH Human  20 Units Subcutaneous 4 times per day  . pantoprazole sodium  40 mg Per Tube Daily    Time spent on care of this patient: 35 mins   Modine Oppenheimer T , MD   [facility] Office  [phone] Pager - Text Page per Loretha Stapler as per below:  On-Call/Text Page:      [email or web address]      password [email or web address]  If 7PM-7AM, please contact night-coverage [email or web address] Password [email or web address] 12/27/2014, 10:23 AM   LOS: 13 days

## 2014-12-28 DIAGNOSIS — IMO0002 Reserved for concepts with insufficient information to code with codable children: Secondary | ICD-10-CM | POA: Diagnosis present

## 2014-12-28 DIAGNOSIS — E118 Type 2 diabetes mellitus with unspecified complications: Secondary | ICD-10-CM

## 2014-12-28 DIAGNOSIS — I248 Other forms of acute ischemic heart disease: Secondary | ICD-10-CM

## 2014-12-28 DIAGNOSIS — E1165 Type 2 diabetes mellitus with hyperglycemia: Secondary | ICD-10-CM

## 2014-12-28 LAB — GLUCOSE, CAPILLARY
GLUCOSE-CAPILLARY: 158 mg/dL — AB (ref 65–99)
GLUCOSE-CAPILLARY: 201 mg/dL — AB (ref 65–99)
GLUCOSE-CAPILLARY: 209 mg/dL — AB (ref 65–99)
GLUCOSE-CAPILLARY: 262 mg/dL — AB (ref 65–99)
Glucose-Capillary: 173 mg/dL — ABNORMAL HIGH (ref 65–99)
Glucose-Capillary: 258 mg/dL — ABNORMAL HIGH (ref 65–99)
Glucose-Capillary: 263 mg/dL — ABNORMAL HIGH (ref 65–99)

## 2014-12-28 LAB — CBC WITH DIFFERENTIAL/PLATELET
Basophils Absolute: 0 10*3/uL (ref 0.0–0.1)
Basophils Relative: 1 %
EOS ABS: 0.1 10*3/uL (ref 0.0–0.7)
Eosinophils Relative: 1 %
HEMATOCRIT: 26.5 % — AB (ref 39.0–52.0)
HEMOGLOBIN: 8.7 g/dL — AB (ref 13.0–17.0)
LYMPHS ABS: 0.9 10*3/uL (ref 0.7–4.0)
LYMPHS PCT: 12 %
MCH: 29.4 pg (ref 26.0–34.0)
MCHC: 32.8 g/dL (ref 30.0–36.0)
MCV: 89.5 fL (ref 78.0–100.0)
MONOS PCT: 3 %
Monocytes Absolute: 0.2 10*3/uL (ref 0.1–1.0)
NEUTROS PCT: 83 %
Neutro Abs: 6.2 10*3/uL (ref 1.7–7.7)
Platelets: 129 10*3/uL — ABNORMAL LOW (ref 150–400)
RBC: 2.96 MIL/uL — AB (ref 4.22–5.81)
RDW: 13.8 % (ref 11.5–15.5)
WBC: 7.4 10*3/uL (ref 4.0–10.5)

## 2014-12-28 LAB — COMPREHENSIVE METABOLIC PANEL
ALK PHOS: 59 U/L (ref 38–126)
ALT: 35 U/L (ref 17–63)
ANION GAP: 7 (ref 5–15)
AST: 31 U/L (ref 15–41)
Albumin: 1.6 g/dL — ABNORMAL LOW (ref 3.5–5.0)
BILIRUBIN TOTAL: 0.5 mg/dL (ref 0.3–1.2)
BUN: 25 mg/dL — ABNORMAL HIGH (ref 6–20)
CALCIUM: 7.4 mg/dL — AB (ref 8.9–10.3)
CO2: 23 mmol/L (ref 22–32)
CREATININE: 1.39 mg/dL — AB (ref 0.61–1.24)
Chloride: 113 mmol/L — ABNORMAL HIGH (ref 101–111)
GFR, EST NON AFRICAN AMERICAN: 56 mL/min — AB (ref >60)
Glucose, Bld: 240 mg/dL — ABNORMAL HIGH (ref 65–99)
Potassium: 3 mmol/L — ABNORMAL LOW (ref 3.5–5.1)
SODIUM: 143 mmol/L (ref 135–145)
TOTAL PROTEIN: 5.1 g/dL — AB (ref 6.5–8.1)

## 2014-12-28 LAB — MAGNESIUM: Magnesium: 1.7 mg/dL (ref 1.7–2.4)

## 2014-12-28 MED ORDER — MAGNESIUM SULFATE 2 GM/50ML IV SOLN
2.0000 g | Freq: Once | INTRAVENOUS | Status: AC
  Administered 2014-12-28: 2 g via INTRAVENOUS
  Filled 2014-12-28: qty 50

## 2014-12-28 MED ORDER — POTASSIUM CHLORIDE 10 MEQ/100ML IV SOLN
10.0000 meq | INTRAVENOUS | Status: AC
  Administered 2014-12-28 (×5): 10 meq via INTRAVENOUS
  Filled 2014-12-28 (×5): qty 100

## 2014-12-28 MED ORDER — HYDROCORTISONE 0.5 % EX CREA
TOPICAL_CREAM | Freq: Two times a day (BID) | CUTANEOUS | Status: DC
  Administered 2014-12-28: 1 via TOPICAL
  Administered 2014-12-29 – 2015-01-02 (×7): via TOPICAL
  Filled 2014-12-28 (×2): qty 28.35

## 2014-12-28 MED ORDER — ZOLPIDEM TARTRATE 5 MG PO TABS
5.0000 mg | ORAL_TABLET | Freq: Every evening | ORAL | Status: DC | PRN
  Administered 2014-12-28: 5 mg via ORAL
  Filled 2014-12-28: qty 1

## 2014-12-28 MED ORDER — INSULIN GLARGINE 100 UNIT/ML ~~LOC~~ SOLN
8.0000 [IU] | Freq: Every day | SUBCUTANEOUS | Status: DC
  Administered 2014-12-28 – 2014-12-29 (×2): 8 [IU] via SUBCUTANEOUS
  Filled 2014-12-28 (×2): qty 0.08

## 2014-12-28 NOTE — Progress Notes (Signed)
Patient Name: Brent Rasmussen Date of Encounter: 12/28/2014    SUBJECTIVE: Tube feeds stopped yesterday. Eating without abdominal pain. Denies chest pain or shortness of breath.   TELEMETRY:  Normal sinus rhythm on telemetry. Filed Vitals:   12/28/14 0400 12/28/14 0500 12/28/14 0759 12/28/14 0858  BP: 123/70  120/72 120/72  Pulse:   82 87  Temp: 99.4 F (37.4 C)  98.7 F (37.1 C)   TempSrc: Oral  Oral   Resp:   22   Height:      Weight:  238 lb 15.7 oz (108.4 kg)    SpO2:   98%     Intake/Output Summary (Last 24 hours) at 12/28/14 0942 Last data filed at 12/28/14 0740  Gross per 24 hour  Intake    700 ml  Output   1400 ml  Net   -700 ml   LABS: Basic Metabolic Panel:  Recent Labs  16/10/96 0518  12/27/14 0227 12/27/14 0620 12/28/14 0534  NA 155*  < > 152* 149* 143  K 3.0*  < > 3.1* 3.2* 3.0*  CL 123*  < > 121* 119* 113*  CO2 25  < > GLUCOSE 169*  < > 157* 211* 240*  BUN 38*  < > 34* 32* 25*  CREATININE 1.47*  < > 1.37* 1.44* 1.39*  CALCIUM 7.8*  < > 7.5* 7.6* 7.4*  MG 1.9  < >  --  1.9 1.7  PHOS 3.5  --  3.3  --   --   < > = values in this interval not displayed. CBC:  Recent Labs  12/27/14 0620 12/28/14 0534  WBC 10.9* 7.4  NEUTROABS 9.5* 6.2  HGB 9.5* 8.7*  HCT 30.9* 26.5*  MCV 93.1 89.5  PLT 190 129*   Cardiac Enzymes:  Recent Labs  12/26/14 2007 12/27/14 0227 12/27/14 0620  TROPONINI 0.34* 0.26* 0.57*   BNP: Invalid input(s): POCBNP Hemoglobin A1C:  Recent Labs  12/25/14 1653  HGBA1C 10.9*   Fasting Lipid Panel:  Recent Labs  12/25/14 1652  CHOL 97  HDL 15*  LDLCALC 47  TRIG 045*  CHOLHDL 6.5   BNP    Component Value Date/Time   BNP 404.0* 12/25/2014 1230    ProBNP No results found for: PROBNP   Radiology/Studies:  No new data  Physical Exam: Blood pressure 120/72, pulse 87, temperature 98.7 F (37.1 C), temperature source Oral, resp. rate 22, height  (1.778 m), weight 238 lb 15.7  oz (108.4 kg), SpO2 98 %. Weight change: 0 lb (0 kg)  Wt Readings from Last 3 Encounters:  12/28/14 238 lb 15.7 oz (108.4 kg)    Sitting up in chair. Left subclavian TLC  Basilar crackles bilaterally, no wheezes Cardiac exam reveals no gallop or rub, RRR No peripheral edema  ASSESSMENT:  1. Acute on possibly chronic systolic heart failure. 2. History of ischemic heart disease with significant troponin elevations during this hospital stay. No anginal complaints. 3. Acute kidney injury, improving to net baseline. 4. Pancreatitis, improving 5. DKA, improved  Plan:  1. Clincally improving. Will need coronary assessment. I discussed this with him and his wife today. They are in agreement. Will likely plan for LHC on Thursday. 2. Continue active anti-ischemic therapy including aspirin, beta blocker, and consider long-acting nitrates as tolerated by blood     pressure. 3. Diarrhea has slowed significantly off tube feeds. Diet is advancing without pancreatic pain.  Chrystie Nose,  MD, [facility] Attending Cardiologist [facility]  Lisette AbuKenneth C [facility] 12/28/2014, 9:42 AM

## 2014-12-28 NOTE — Progress Notes (Signed)
Patient is noncompliant with safety regimen. Patient continues to get up from bed to chair or bathroom without attempting to call for assistance. Patient educated on the importance of calling for assistance when attempting to get out of bed, however, patient remains noncompliant. Chair alarm place for patient's safety.

## 2014-12-28 NOTE — Progress Notes (Signed)
[place] TEAM 1 - Stepdown/ICU TEAM Progress Note  Brent Rasmussen ZOX:[id number] DOB: April 21, 1960 DOA: 12/14/2014 PCP: No primary care provider on file.  Admit HPI / Brief Narrative: Brent Rasmussen is a 54 y.o. WM PMHx Diabetes Type 2, HLD, CAD Native Artery, Asthma, Bell's Palsy   Presented to [facility] for abdominal pain. He was found to have pancreatitis and be in DKA. There he was provided 3.5 L bolus. He was started on an insulin drip and was transferred to [facility] for care.  HPI/Subjective: 12/6 A/O 4 , negative abdominal pain, negative N/V, negative SOB, negative DOB. States has rash on his back which is nonraised, erythematous, pruritic    Assessment/Plan: Severe pancreatitis/Shock: -Shock resolved - Likely secondary to a combination of DKA & hypertriglyceridemia. Completed 7 day course of imipenem.  -Tolerating clear liquids; advance to full liquids in the A.m. -Continuing treatment of pain with fentanyl PRN   Acute hypoxic respiratory failure: -Multifactorial to include severe shock, NSTEMI, fluid overload and acute systolic CHF - Decrease fluid load -Continue albuterol QID PRN -12/4 PCXR; Lt Pleural Effusion? -Has resolved with diuresis  SOB -Improving w/ decrease in positive Volume load  -Monitor closely patient still volume positive. If required PRN Lasix  NSTEMI/demand ischemia:  -Cardiology following. Per cardiology note will appear to need cardiac catheterization once more stable -Ejection fraction 30-35%. Known history of coronary artery disease. -Strict I&OSince admission; 9.3 L -Daily weight; on admission 104.3 kg            12/ 6 weight= 108.4 kg   -Coreg 12.5 mg BID -Aspirin 325 mg daily -Lipitor 40 mg daily -Avoiding IV heparin given potential for hemorrhagic transformation of pancreatitis. -Chest pain; resolved  Acute systolic CHF -See NSTEMI  Acute renal failure:  -Slowly continuing to improve.  -Continuing to monitor renal  output as well as trending daily BUN/creatinine.  Hypernatremia (peaked at 166):  -Resolved -Continue D5W w/ 20 mEq KCl at 50 mL per hour. (Patient fluid overload)   Hypokalemia -See hypernatremia -Potassium goal> 4 -Potassium 50 mEq 1  Hypomagnesemia -Magnesium goal> 2 -Magnesium IV 2 gm  DM Type 2 Uncontrolled -12/3 Hemoglobin A1c= 10.9  -Lantus 8 units daily -Resistant SSI  HLD -Lipid panel not within ADA guidelines; elevated triglycerides   -Continue Lipitor 40 mg daily  Toxic metabolic encephalopathy:  -Improving, patient A/O 4; still appears to have some periods of memory difficulty.     Code Status: FULL Family Communication: no family present at time of exam Disposition Plan: Per cardiology    Consultants: Dr.Henry Malissa Hippo cardiology Dr.Jennings Harlon [facility]   Procedure/Significant Events: 11/22 MRI brain: No acute processes 11/22 CT abdomen: Moderate to severe acute pancreatitis without fluid collection, abscess or pseudocyst. 11/25 repeat CT: Worsening diffuse pancreatitis, no abscess, no cysts 11/28 TTE: EF 30-35% w/ severe hypokinesis & anterior wall motion abnormality. 11/29 KUB: DHT tip in distal duodenum 12/02 KUB: DHT w/ tip in distal duodenum or transverse segment  SIGNIFICANT EVENTS: 11/22 -Transferred to [facility] from [facility] 11/23- BM vasal vagals, cpr 2 min , ETT placed, on presors 11/24- aline placed, major improved pressors, volume given 11/25- extubated, escallating abdo pain 11/26- resp failure, re intubated 11/26- concern NSTEMI, heparin started 11/27- neg 6.4 liters 11/30- extubated 12/01- radial arterial line removed 12/02- pulled out Dobbhoff Tube had to have it replaced   Culture BCx2 11/22: Negative   Antibiotics: Imipenem 11/24 - 11/30  DVT prophylaxis: Subcutaneous heparin  and SCD   Devices    LINES / TUBES:      Continuous Infusions: . dextrose 5 % with KCl 20 mEq / L 20 mEq (12/27/14  2246)    Objective: VITAL SIGNS: Temp: 99.1 F (37.3 C) (12/06 1310) Temp Source: Oral (12/06 1310) BP: 110/70 mmHg (12/06 1310) Pulse Rate: 87 (12/06 0858) SPO2; FIO2:   Intake/Output Summary (Last 24 hours) at 12/28/14 1400 Last data filed at 12/28/14 1310  Gross per 24 hour  Intake   1100 ml  Output   1800 ml  Net   -700 ml     Exam: General: A/O 4, sitting in chair comfortably, negative acute respiratory distress  Eyes: Negative headache, eye pain, double vision,negative scleral hemorrhage ENT: Negative Runny nose, negative ear pain, negative gingival bleeding, Neck:  Negative scars, masses, torticollis, lymphadenopathy, JVD Lungs: clear to auscultation bilateral, Negative wheezes or crackles Cardiovascular: Regular rate and rhythm without murmur gallop or rub normal S1 and S2 Abdomen: Negative abdominal pain, distended, positive soft, bowel sounds, no rebound, no ascites, no appreciable mass Extremities: No significant cyanosis, clubbing, or edema bilateral lower extremities Skin; patient has rash on back erythematous, nonraised, pruritic  Psychiatric:  Negative depression, negative anxiety, negative fatigue, negative mania  Neurologic:  Cranial nerves II through XII intact, tongue/uvula midline, all extremities muscle strength 5/5, sensation intact throughout, negative dysarthria, negative expressive aphasia, negative receptive aphasia.   Data Reviewed: Basic Metabolic Panel:  Recent Labs Lab 12/23/14 0423  12/24/14 0350  12/25/14 0534  12/26/14 0518 12/26/14 2007 12/27/14 0227 12/27/14 0620 12/28/14 0534  NA 166*  < > 166*  < > 162*  < > 155* 153* 152* 149* 143  K 3.1*  < > 3.2*  < > 3.3*  < > 3.0* 3.5 3.1* 3.2* 3.0*  CL 127*  < > 125*  < > 126*  < > 123* 121* 121* 119* 113*  CO2 28  < > 29  < > 27  < > 25 24 24 22 23   GLUCOSE 152*  < > 157*  < > 128*  < > 169* 130* 157* 211* 240*  BUN 66*  < > 57*  < > 42*  < > 38* 37* 34* 32* 25*  CREATININE 2.06*  <  > 1.99*  < > 1.61*  < > 1.47* 1.35* 1.37* 1.44* 1.39*  CALCIUM 7.6*  < > 7.6*  < > 7.8*  < > 7.8* 7.9* 7.5* 7.6* 7.4*  MG 2.2  --  2.1  --  2.1  --  1.9 1.9  --  1.9 1.7  PHOS 3.4  --  3.3  --  3.0  --  3.5  --  3.3  --   --   < > = values in this interval not displayed. Liver Function Tests:  Recent Labs Lab 12/25/14 0534 12/26/14 0518 12/27/14 0227 12/27/14 0620 12/28/14 0534  AST  --  33  --  43* 31  ALT  --  28  --  38 35  ALKPHOS  --  60  --  73 59  BILITOT  --  0.6  --  0.4 0.5  PROT  --  5.5*  --  5.5* 5.1*  ALBUMIN 1.6* 1.6* 1.6* 1.6* 1.6*    Recent Labs Lab 12/26/14 0518  LIPASE 22   No results for input(s): AMMONIA in the last 168 hours. CBC:  Recent Labs Lab 12/24/14 0533 12/25/14 0534 12/26/14 0518 12/27/14 0620 12/28/14 0534  WBC  14.5* 12.9* 11.4* 10.9* 7.4  NEUTROABS 13.2* 11.7* 10.2* 9.5* 6.2  HGB 10.4* 10.2* 9.7* 9.5* 8.7*  HCT 35.1* 35.0* 32.6* 30.9* 26.5*  MCV 96.4 96.7 95.3 93.1 89.5  PLT 183 178 186 190 129*   Cardiac Enzymes:  Recent Labs Lab 12/26/14 2007 12/27/14 0227 12/27/14 0620  TROPONINI 0.34* 0.26* 0.57*   BNP (last 3 results)  Recent Labs  12/25/14 1230  BNP 404.0*    ProBNP (last 3 results) No results for input(s): PROBNP in the last 8760 hours.  CBG:  Recent Labs Lab 12/27/14 1642 12/27/14 2024 12/28/14 0018 12/28/14 0407 12/28/14 0758  GLUCAP 145* 155* 158* 201* 173*    Recent Results (from the past 240 hour(s))  C difficile quick scan w PCR reflex     Status: None   Collection Time: 12/27/14  6:00 AM  Result Value Ref Range Status   C Diff antigen NEGATIVE NEGATIVE Final   C Diff toxin NEGATIVE NEGATIVE Final   C Diff interpretation Negative for toxigenic C. difficile  Final     Studies:  Recent x-ray studies have been reviewed in detail by the Attending Physician  Scheduled Meds:  Scheduled Meds: . antiseptic oral rinse  7 mL Mouth Rinse QID  . aspirin  325 mg Oral Daily  . atorvastatin   40 mg Oral q1800  . carvedilol  12.5 mg Oral BID WC  . chlorhexidine gluconate  15 mL Mouth Rinse BID  . furosemide  20 mg Intravenous Once  . heparin subcutaneous  5,000 Units Subcutaneous 3 times per day  . hydrocortisone cream   Topical BID  . insulin aspart  0-20 Units Subcutaneous 6 times per day  . insulin glargine  8 Units Subcutaneous Daily  . magnesium sulfate 1 - 4 g bolus IVPB  2 g Intravenous Once  . pantoprazole sodium  40 mg Per Tube Daily    Time spent on care of this patient: 40 mins   WOODS, Roselind Messier , MD  [facility] Office  [phone] Pager - [phone]  On-Call/Text Page:      [email or web address]      password [email or web address]  If 7PM-7AM, please contact night-coverage [email or web address] Password [email or web address] 12/28/2014, 2:00 PM   LOS: 14 days   Care during the described time interval was provided by me .  I have reviewed this patient's available data, including medical history, events of note, physical examination, and all test results as part of my evaluation. I have personally reviewed and interpreted all radiology studies.   Carolyne Littles, MD [phone] Pager

## 2014-12-28 NOTE — Plan of Care (Signed)
Problem: Safety: Goal: Ability to remain free from injury will improve Outcome: Progressing Variance: Patient Uncooperative Comments: Patient is noncompliant with safety regulations. Patient is very impulsive and continue get up without assistance.

## 2014-12-29 LAB — GLUCOSE, CAPILLARY
GLUCOSE-CAPILLARY: 195 mg/dL — AB (ref 65–99)
Glucose-Capillary: 229 mg/dL — ABNORMAL HIGH (ref 65–99)
Glucose-Capillary: 214 mg/dL — ABNORMAL HIGH (ref 65–99)
Glucose-Capillary: 304 mg/dL — ABNORMAL HIGH (ref 65–99)
Glucose-Capillary: 323 mg/dL — ABNORMAL HIGH (ref 65–99)
Glucose-Capillary: 348 mg/dL — ABNORMAL HIGH (ref 65–99)

## 2014-12-29 LAB — CBC WITH DIFFERENTIAL/PLATELET
BASOS PCT: 0 %
Basophils Absolute: 0 10*3/uL (ref 0.0–0.1)
EOS ABS: 0.1 10*3/uL (ref 0.0–0.7)
EOS PCT: 2 %
HCT: 26.2 % — ABNORMAL LOW (ref 39.0–52.0)
HEMOGLOBIN: 8.7 g/dL — AB (ref 13.0–17.0)
LYMPHS ABS: 1 10*3/uL (ref 0.7–4.0)
Lymphocytes Relative: 13 %
MCH: 29.4 pg (ref 26.0–34.0)
MCHC: 33.2 g/dL (ref 30.0–36.0)
MCV: 88.5 fL (ref 78.0–100.0)
MONO ABS: 0.2 10*3/uL (ref 0.1–1.0)
MONOS PCT: 3 %
Neutro Abs: 6.1 10*3/uL (ref 1.7–7.7)
Neutrophils Relative %: 82 %
PLATELETS: 155 10*3/uL (ref 150–400)
RBC: 2.96 MIL/uL — ABNORMAL LOW (ref 4.22–5.81)
RDW: 13.5 % (ref 11.5–15.5)
WBC: 7.4 10*3/uL (ref 4.0–10.5)

## 2014-12-29 LAB — COMPREHENSIVE METABOLIC PANEL
ALK PHOS: 63 U/L (ref 38–126)
ALT: 37 U/L (ref 17–63)
AST: 33 U/L (ref 15–41)
Albumin: 1.6 g/dL — ABNORMAL LOW (ref 3.5–5.0)
Anion gap: 5 (ref 5–15)
BUN: 21 mg/dL — AB (ref 6–20)
CALCIUM: 7.8 mg/dL — AB (ref 8.9–10.3)
CHLORIDE: 112 mmol/L — AB (ref 101–111)
CO2: 25 mmol/L (ref 22–32)
CREATININE: 1.3 mg/dL — AB (ref 0.61–1.24)
GFR calc Af Amer: >60 mL/min (ref >60)
Glucose, Bld: 195 mg/dL — ABNORMAL HIGH (ref 65–99)
Potassium: 3.6 mmol/L (ref 3.5–5.1)
Sodium: 142 mmol/L (ref 135–145)
Total Bilirubin: 0.3 mg/dL (ref 0.3–1.2)
Total Protein: 5.4 g/dL — ABNORMAL LOW (ref 6.5–8.1)

## 2014-12-29 LAB — MAGNESIUM: Magnesium: 1.9 mg/dL (ref 1.7–2.4)

## 2014-12-29 MED ORDER — SODIUM CHLORIDE 0.9 % IV SOLN
INTRAVENOUS | Status: DC
  Administered 2014-12-29: 17:00:00 via INTRAVENOUS

## 2014-12-29 MED ORDER — PANTOPRAZOLE SODIUM 40 MG PO TBEC
40.0000 mg | DELAYED_RELEASE_TABLET | Freq: Every day | ORAL | Status: DC
  Administered 2014-12-30 – 2015-01-04 (×6): 40 mg via ORAL
  Filled 2014-12-29 (×6): qty 1

## 2014-12-29 MED ORDER — OXYCODONE HCL 5 MG PO TABS
5.0000 mg | ORAL_TABLET | ORAL | Status: DC | PRN
  Administered 2014-12-29 (×2): 5 mg via ORAL
  Administered 2014-12-30 – 2015-01-03 (×19): 10 mg via ORAL
  Administered 2015-01-03: 5 mg via ORAL
  Administered 2015-01-04 (×2): 10 mg via ORAL
  Filled 2014-12-29 (×15): qty 2
  Filled 2014-12-29 (×2): qty 1
  Filled 2014-12-29 (×5): qty 2
  Filled 2014-12-29: qty 1

## 2014-12-29 MED ORDER — FENTANYL CITRATE (PF) 100 MCG/2ML IJ SOLN: 25.0000 ug | Freq: Four times a day (QID) | INTRAMUSCULAR | Status: DC | PRN

## 2014-12-29 MED ORDER — POLYVINYL ALCOHOL 1.4 % OP SOLN
1.0000 [drp] | OPHTHALMIC | Status: DC | PRN
  Administered 2014-12-29 – 2015-01-03 (×4): 1 [drp] via OPHTHALMIC
  Filled 2014-12-29 (×2): qty 15

## 2014-12-29 NOTE — Progress Notes (Signed)
Physical Therapy Treatment Patient Details Name: Brent Rasmussen MRN: [id number] MACBRIDE: 17-Jun-1960 Today's Date: 12/29/2014    History of Present Illness Patient is a 54 y/o male who presents with abdominal pain and found to have pancreatitis and be in DKA. s/p cardiac arrest 11/23, intubated x2, concern for NSTEMI, extubated 11/30. PMH includes T2DM, CAD (prior stent 5-10 years ago at [facility]) and asthma.     PT Comments    Pt admitted with above diagnosis. Pt currently with functional limitations due to balance and endurance deficits. Pt is impulsive which decreases his safety.  He states that his wife will not always be there at home.  Dizziness limiting him currently as well with low BP.  Feel that SNF is best option with PT/OT services to reach more I level at home.  Will need to use RW at home as well.  Pt will benefit from skilled PT to increase their independence and safety with mobility to allow discharge to the venue listed below.    Follow Up Recommendations  SNF     Equipment Recommendations  Other (comment) (TBA) RW   Recommendations for Other Services       Precautions / Restrictions Precautions Precautions: Fall Precaution Comments: impulsivity Restrictions Weight Bearing Restrictions: No    Mobility  Bed Mobility                  Transfers Overall transfer level: Needs assistance Equipment used: Rolling walker (2 wheeled) Transfers: Sit to/from Stand Sit to Stand: Min guard;Min assist;+2 safety/equipment         General transfer comment: Pt impulsively stands and without cues.  Somewhat unsteady needing controlled descent into chair as pt impulsive and does not ensure that he is centered at chair prior to sitting down.  Poor safety awareness overall.   Sits abruptly without warning however this decr safety.    Ambulation/Gait Ambulation/Gait assistance: Min assist;+2 safety/equipment;+2 physical assistance;Mod assist Ambulation Distance (Feet):  200 Feet (100 feet x 2) Assistive device: Rolling walker (2 wheeled) Gait Pattern/deviations: Step-through pattern;Decreased stride length;Narrow base of support   Gait velocity interpretation: <1.8 ft/sec, indicative of risk for recurrent falls General Gait Details: Pt needed one seated rest break.  Pt did c/o some dizziness with ambulation with low BP that was monitored.  Pt impulsive with movements with poor safety awareness.  Pt with pigeon toes with ambulation which is premorbid.  At times, pt was unsteady due to his narrow BOS and due to his impulsivity.    Stairs            Wheelchair Mobility    Modified Rankin (Stroke Patients Only)       Balance Overall balance assessment: Needs assistance Sitting-balance support: No upper extremity supported;Feet supported Sitting balance-Leahy Scale: Fair     Standing balance support: Bilateral upper extremity supported;During functional activity Standing balance-Leahy Scale: Poor Standing balance comment: Requires UE support bil UEs.                     Cognition Arousal/Alertness: Awake/alert Behavior During Therapy: Impulsive Overall Cognitive Status: Impaired/Different from baseline Area of Impairment: Safety/judgement;Problem solving         Safety/Judgement: Decreased awareness of safety   Problem Solving: Slow processing;Decreased initiation;Difficulty sequencing;Requires verbal cues;Requires tactile cues      Exercises General Exercises - Lower Extremity Ankle Circles/Pumps: Both;15 reps;Seated Quad Sets: Both;10 reps;Seated Long Arc Quad: AROM;Both;10 reps;Seated Heel Slides: AROM;Both;10 reps;Supine    General Comments General  comments (skin integrity, edema, etc.): BP on arrival was 87/64, 90/53 prior to standing with no dizziness.  Once pt stood and ambulated c/o dizziness and BP 89/65.  Once pt back inroom, BP 84/73.  Pt states dizziness ok once back in chair.  O296-100% on RA.  HRj 80- 100 bpm.        Pertinent Vitals/Pain Pain Assessment: No/denies painSee above for VS    Home Living                      Prior Function            PT Goals (current goals can now be found in the care plan section) Progress towards PT goals: Progressing toward goals    Frequency  Min 3X/week    PT Plan Current plan remains appropriate    Co-evaluation             End of Session Equipment Utilized During Treatment: Gait belt Activity Tolerance: Patient limited by fatigue Patient left: in chair;with call bell/phone within reach;with chair alarm set     Time: [id number] PT Time Calculation (min) (ACUTE ONLY): 24 min  Charges:  $Gait Training: 8-22 mins $Therapeutic Exercise: 8-22 mins                    G Codes:      WhiteAlvis Lemmings, Kizer Nobbe F 12/29/2014, 1:33 PM [employer] CorporationDawn Hamzah Savoca,PT Acute Rehabilitation [phone] (pager)

## 2014-12-29 NOTE — Progress Notes (Signed)
CSW spoke with pt concerning PT recommendation for SNF- pt is refusing SNF at this time.  States he lives home with wife and feels safe returning when ready for DC- agreeable to home health options  CSW signing off.  Merlyn LotJenna Holoman, LCSWA Clinical Social Worker [phone]

## 2014-12-29 NOTE — Care Management Note (Signed)
Case Management Note  Patient Details  Name: Brent ShamesDaniel J Modi MRN: [id number] Date of Birth: May 27, 1960  Subjective/Objective:    Pt lives with spouse, is adamant that he will discharge to home vs going to SNF for rehab.  Does agree to home health PT, [facility]                            Expected Discharge Plan:  Home w Home Health Services  Discharge planning Services  CM Consult  Post Acute Care Choice:  Home Health  [facility]  Status of Service:  In process, will continue to follow  Magdalene RiverMayo, Teigen Parslow T, RN 12/29/2014, 11:54 AM

## 2014-12-29 NOTE — Progress Notes (Signed)
       Patient Name: Brent ShamesDaniel J Bealer Date of Encounter: 12/29/2014    SUBJECTIVE: No events recorded overnight. Appetite is better. Labs stable. Creatinine is improved.  TELEMETRY:  Normal sinus rhythm on telemetry. Filed Vitals:   12/29/14 0355 12/29/14 0419 12/29/14 0815 12/29/14 0829  BP: 112/68  103/74 103/74  Pulse: 83  79 78  Temp: 98.8 F (37.1 C)  98.3 F (36.8 C)   TempSrc: Oral  Oral   Resp: 20  26   Height:      Weight:  242 lb 3.2 oz (109.861 kg)    SpO2:   100%     Intake/Output Summary (Last 24 hours) at 12/29/14 0955 Last data filed at 12/29/14 0929  Gross per 24 hour  Intake   1958 ml  Output   2250 ml  Net   -292 ml   LABS: Basic Metabolic Panel:  Recent Labs  40/98/1110/06/07 0227  12/28/14 0534 12/29/14 0241  NA 152*  < > 143 142  K 3.1*  < > 3.0* 3.6  CL 121*  < > 113* 112*  CO2 24  < > 23 25  GLUCOSE 157*  < > 240* 195*  BUN 34*  < > 25* 21*  CREATININE 1.37*  < > 1.39* 1.30*  CALCIUM 7.5*  < > 7.4* 7.8*  MG  --   < > 1.7 1.9  PHOS 3.3  --   --   --   < > = values in this interval not displayed. CBC:  Recent Labs  12/28/14 0534 12/29/14 0241  WBC 7.4 7.4  NEUTROABS 6.2 6.1  HGB 8.7* 8.7*  HCT 26.5* 26.2*  MCV 89.5 88.5  PLT 129* 155   Cardiac Enzymes:  Recent Labs  12/26/14 2007 12/27/14 0227 12/27/14 0620  TROPONINI 0.34* 0.26* 0.57*   BNP: Invalid input(s): POCBNP Hemoglobin A1C: No results for input(s): HGBA1C in the last 72 hours. Fasting Lipid Panel: No results for input(s): CHOL, HDL, LDLCALC, TRIG, CHOLHDL, LDLDIRECT in the last 72 hours. BNP    Component Value Date/Time   BNP 404.0* 12/25/2014 1230    ProBNP No results found for: PROBNP   Radiology/Studies:  No new data  Physical Exam: Blood pressure 103/74, pulse 78, temperature 98.3 F (36.8 C), temperature source Oral, resp. rate 26, height 5\' 10"  (1.778 m), weight 242 lb 3.2 oz (109.861 kg), SpO2 100 %. Weight change: 3 lb 3.5 oz (1.461 kg)  Wt  Readings from Last 3 Encounters:  12/29/14 242 lb 3.2 oz (109.861 kg)    Sitting up in chair. Lungs clear Cardiac exam reveals no gallop or rub, RRR No peripheral edema  ASSESSMENT:  1. Acute on possibly chronic systolic heart failure. 2. History of ischemic heart disease with significant troponin elevations during this hospital stay. No anginal complaints. 3. Acute kidney injury, improving to net baseline. 4. Pancreatitis, improving 5. DKA, improved  Plan:  1. Clincally improving. Will need coronary assessment. [facility]. Keep NPO p MN. I discussed risks/benefits/alternatives of the procedure with him and they agree to proceed. 2. Continue active anti-ischemic therapy including aspirin, beta blocker, and consider long-acting nitrates as tolerated by blood     pressure.  Chrystie NoseKenneth C. Hilty, MD, [facility] HospitalFACC Attending Cardiologist [facility]  Chrystie NoseKenneth C Hilty 12/29/2014, 9:55 AM

## 2014-12-29 NOTE — Progress Notes (Signed)
[place] TEAM 1 - Stepdown/ICU TEAM PROGRESS NOTE  Brent Rasmussen XBM:[id number] DOB: 1960-06-25 DOA: 12/14/2014 PCP: No primary care provider on file.  Admit HPI / Brief Narrative: 55 y.o. M Hx DM2, HLD, CAD, Asthma, and Bell's Palsy who presented to [facility] for abdominal pain. He was found to have pancreatitis and was in DKA. He was provided 3.5 L bolus. He was started on an insulin drip and was transferred to [facility].  Significant Events: 11/22 -Transferred to [facility] from [facility] 11/22 MRI brain: No acute processes 11/22 CT abdomen: Moderate to severe acute pancreatitis without fluid collection, abscess or pseudocyst. 11/23- with bowel movement became vaso-vagal, passed out, and reportedly had no pulse, cpr 2 min , ETT placed, on presors 11/24 early AM - intubated  11/25- extubated, escallating abdo pain 11/25 repeat CT: Worsening diffuse pancreatitis, no abscess, no cysts 11/26- resp failure, re intubated 11/26- concern NSTEMI, heparin started 11/28 TTE: EF 30-35% w/ severe hypokinesis & anterior wall motion abnormality 11/29 KUB: DHT tip in distal duodenum 11/30- extubated 12/01- radial arterial line removed 12/02 KUB: DHT w/ tip in distal duodenum or transverse segment 12/02- pulled out Dobbhoff Tube had to have it replaced  HPI/Subjective: The patient is reporting uncontrolled low back pain related to sitting in the chair in the bed throughout most of the day.  He denies nausea and vomiting and is tolerating his clear liquids without difficulty.  He is anxious to have his diet advanced.  He denies abdominal pain chest pain or shortness of breath.  Assessment/Plan:  Severe pancreatitis / Shock -Shock resolved -Clinically the patient is progressing well -Continue clear liquids with plan to advance to full liquids tomorrow after his cardiac cath -No clear etiology - pancreatitis is listed as a possible complication of both Invokana and Victoza treatment - the  patient was on both of these medications prior to his admission - he asked me if I believe this could be the cause of his pancreatitis and I informed him that it certainly appears that it could have been - we will not resume these medications at the time of his discharge  Acute hypoxic respiratory failure -Multifactorial to include severe shock, NSTEMI, fluid overload and acute systolic CHF -Resolved - now off supplemental oxygen  NSTEMI / demand ischemia  -Cardiology following - for cardiac cath in a.m.  Acute on ?chronic systolic CHF -Ejection fraction 30-35% -Cardiology directing care   Acute renal failure -crt has improved further to 1.3 today - keep hydrated for pending cardiac cath  Hypernatremia -Resolved with the resumption of oral liquid intake  Hypokalemia -Resolved  DM2  -A1c 10.9 - CBG currently reasonably controlled - follow w/o change    Toxic metabolic encephalopathy -Resolved  Diarrhea  -C diff negative - appears to have been related to tube feeding  Obesity   Code Status: FULL Family Communication: no family present at time of exam Disposition Plan: SDU until cardiac cath completed   Consultants: [facility]  PCCM  Antibiotics: None currently   DVT prophylaxis: SQ heparin   Objective: Blood pressure 101/63, pulse 76, temperature 99.1 F (37.3 C), temperature source Oral, resp. rate 11, height  (1.778 m), weight 109.861 kg (242 lb 3.2 oz), SpO2 98 %.  Intake/Output Summary (Last 24 hours) at 12/29/14 1541 Last data filed at 12/29/14 1501  Gross per 24 hour  Intake   1748 ml  Output   2200 ml  Net   -452 ml  Exam: General: No acute respiratory distress - alert and oriented  Lungs: Clear to auscultation bilaterally - no wheeze  Cardiovascular: Regular rate and rhythm - no murmur gallop or rub  Abdomen: Nontender, protuberent, soft, bowel sounds positive, no rebound, no ascites, no appreciable mass Extremities: No significant  cyanosis, or clubbing; 1+ edema bilateral lower extremities  Data Reviewed: Basic Metabolic Panel:  Recent Labs Lab 12/23/14 0423  12/24/14 0350  12/25/14 0534  12/26/14 0518 12/26/14 2007 12/27/14 0227 12/27/14 0620 12/28/14 0534 12/29/14 0241  NA 166*  < > 166*  < > 162*  < > 155* 153* 152* 149* 143 142  K 3.1*  < > 3.2*  < > 3.3*  < > 3.0* 3.5 3.1* 3.2* 3.0* 3.6  CL 127*  < > 125*  < > 126*  < > 123* 121* 121* 119* 113* 112*  CO2 28  < > 29  < > 27  < > GLUCOSE 152*  < > 157*  < > 128*  < > 169* 130* 157* 211* 240* 195*  BUN 66*  < > 57*  < > 42*  < > 38* 37* 34* 32* 25* 21*  CREATININE 2.06*  < > 1.99*  < > 1.61*  < > 1.47* 1.35* 1.37* 1.44* 1.39* 1.30*  CALCIUM 7.6*  < > 7.6*  < > 7.8*  < > 7.8* 7.9* 7.5* 7.6* 7.4* 7.8*  MG 2.2  --  2.1  --  2.1  --  1.9 1.9  --  1.9 1.7 1.9  PHOS 3.4  --  3.3  --  3.0  --  3.5  --  3.3  --   --   --   < > = values in this interval not displayed.  CBC:  Recent Labs Lab 12/25/14 0534 12/26/14 0518 12/27/14 0620 12/28/14 0534 12/29/14 0241  WBC 12.9* 11.4* 10.9* 7.4 7.4  NEUTROABS 11.7* 10.2* 9.5* 6.2 6.1  HGB 10.2* 9.7* 9.5* 8.7* 8.7*  HCT 35.0* 32.6* 30.9* 26.5* 26.2*  MCV 96.7 95.3 93.1 89.5 88.5  PLT 178 186 190 129* 155    Liver Function Tests:  Recent Labs Lab 12/26/14 0518 12/27/14 0227 12/27/14 0620 12/28/14 0534 12/29/14 0241  AST 33  --  43* 31 33  ALT 28  --  38 35 37  ALKPHOS 60  --  73 59 63  BILITOT 0.6  --  0.4 0.5 0.3  PROT 5.5*  --  5.5* 5.1* 5.4*  ALBUMIN 1.6* 1.6* 1.6* 1.6* 1.6*    Recent Labs Lab 12/26/14 0518  LIPASE 22    Cardiac Enzymes:  Recent Labs Lab 12/26/14 2007 12/27/14 0227 12/27/14 0620  TROPONINI 0.34* 0.26* 0.57*    CBG:  Recent Labs Lab 12/28/14 1718 12/28/14 1941 12/29/14 0008 12/29/14 0354 12/29/14 0833  GLUCAP 262* 263* 195* 229* 214*    Recent Results (from the past 240 hour(s))  C difficile quick scan w PCR reflex     Status: None     Collection Time: 12/27/14  6:00 AM  Result Value Ref Range Status   C Diff antigen NEGATIVE NEGATIVE Final   C Diff toxin NEGATIVE NEGATIVE Final   C Diff interpretation Negative for toxigenic C. difficile  Final     Studies:   Recent x-ray studies have been reviewed in detail by the Attending Physician  Scheduled Meds:  Scheduled Meds: . antiseptic oral rinse  7 mL Mouth Rinse QID  .  aspirin  325 mg Oral Daily  . atorvastatin  40 mg Oral q1800  . carvedilol  12.5 mg Oral BID WC  . chlorhexidine gluconate  15 mL Mouth Rinse BID  . furosemide  20 mg Intravenous Once  . heparin subcutaneous  5,000 Units Subcutaneous 3 times per day  . hydrocortisone cream   Topical BID  . insulin aspart  0-20 Units Subcutaneous 6 times per day  . insulin glargine  8 Units Subcutaneous Daily  . [START ON 12/30/2014] pantoprazole  40 mg Oral Daily    Time spent on care of this patient: 35 mins   Reyce Lubeck T , MD   [facility] Office  [phone] Pager - Text Page per Loretha [email or web address] as per below:  On-Call/Text Page:      [email or web address]      password [email or web address]  If 7PM-7AM, please contact night-coverage [email or web address] Password [email or web address] 12/29/2014, 3:41 PM   LOS: 15 days

## 2014-12-30 ENCOUNTER — Encounter (HOSPITAL_COMMUNITY)
Admission: EM | Disposition: A | Discharge status: Home / Self Care | Payer: BLUE CROSS/BLUE SHIELD | Source: Home / Self Care | Attending: Internal Medicine

## 2014-12-30 DIAGNOSIS — I5023 Acute on chronic systolic (congestive) heart failure: Secondary | ICD-10-CM | POA: Diagnosis present

## 2014-12-30 DIAGNOSIS — I2510 Atherosclerotic heart disease of native coronary artery without angina pectoris: Secondary | ICD-10-CM

## 2014-12-30 HISTORY — PX: CARDIAC CATHETERIZATION: SHX172

## 2014-12-30 LAB — PROTIME-INR
INR: 1.55 — AB (ref 0.00–1.49)
PROTHROMBIN TIME: 18.6 s — AB (ref 11.6–15.2)

## 2014-12-30 LAB — IRON AND TIBC
Iron: 15 ug/dL — ABNORMAL LOW (ref 45–182)
SATURATION RATIOS: 9 % — AB (ref 17.9–39.5)
TIBC: 158 ug/dL — ABNORMAL LOW (ref 250–450)
UIBC: 143 ug/dL

## 2014-12-30 LAB — GLUCOSE, CAPILLARY
GLUCOSE-CAPILLARY: 236 mg/dL — AB (ref 65–99)
GLUCOSE-CAPILLARY: 164 mg/dL — AB (ref 65–99)
Glucose-Capillary: 138 mg/dL — ABNORMAL HIGH (ref 65–99)
Glucose-Capillary: 158 mg/dL — ABNORMAL HIGH (ref 65–99)
Glucose-Capillary: 244 mg/dL — ABNORMAL HIGH (ref 65–99)

## 2014-12-30 LAB — BASIC METABOLIC PANEL
Anion gap: 9 (ref 5–15)
BUN: 16 mg/dL (ref 6–20)
CHLORIDE: 106 mmol/L (ref 101–111)
CO2: 23 mmol/L (ref 22–32)
CREATININE: 1.22 mg/dL (ref 0.61–1.24)
Calcium: 8 mg/dL — ABNORMAL LOW (ref 8.9–10.3)
GFR calc Af Amer: >60 mL/min (ref >60)
GFR calc non Af Amer: >60 mL/min (ref >60)
Glucose, Bld: 228 mg/dL — ABNORMAL HIGH (ref 65–99)
POTASSIUM: 3.7 mmol/L (ref 3.5–5.1)
Sodium: 138 mmol/L (ref 135–145)

## 2014-12-30 LAB — CBC
HEMATOCRIT: 26.4 % — AB (ref 39.0–52.0)
Hemoglobin: 8.5 g/dL — ABNORMAL LOW (ref 13.0–17.0)
MCH: 28.4 pg (ref 26.0–34.0)
MCHC: 32.2 g/dL (ref 30.0–36.0)
MCV: 88.3 fL (ref 78.0–100.0)
PLATELETS: 164 10*3/uL (ref 150–400)
RBC: 2.99 MIL/uL — AB (ref 4.22–5.81)
RDW: 13.4 % (ref 11.5–15.5)
WBC: 6.8 10*3/uL (ref 4.0–10.5)

## 2014-12-30 LAB — APTT: aPTT: 29 seconds (ref 24–37)

## 2014-12-30 LAB — FERRITIN: FERRITIN: 1366 ng/mL — AB (ref 24–336)

## 2014-12-30 LAB — TRANSFERRIN: Transferrin: 113 mg/dL — ABNORMAL LOW (ref 180–329)

## 2014-12-30 SURGERY — LEFT HEART CATH AND CORONARY ANGIOGRAPHY
Anesthesia: LOCAL

## 2014-12-30 MED ORDER — ACETAMINOPHEN 325 MG PO TABS: 650.0000 mg | ORAL_TABLET | ORAL | Status: DC | PRN

## 2014-12-30 MED ORDER — IOHEXOL 350 MG/ML SOLN
INTRAVENOUS | Status: DC | PRN
  Administered 2014-12-30: 90 mL via INTRA_ARTERIAL

## 2014-12-30 MED ORDER — SODIUM CHLORIDE 0.9 % IJ SOLN
3.0000 mL | Freq: Two times a day (BID) | INTRAMUSCULAR | Status: DC
  Administered 2014-12-30 – 2014-12-31 (×2): 3 mL via INTRAVENOUS

## 2014-12-30 MED ORDER — PHYTONADIONE 5 MG PO TABS
5.0000 mg | ORAL_TABLET | ORAL | Status: AC
  Administered 2014-12-30: 5 mg via ORAL
  Filled 2014-12-30: qty 1

## 2014-12-30 MED ORDER — SODIUM CHLORIDE 0.9 % IJ SOLN: 3.0000 mL | INTRAMUSCULAR | Status: DC | PRN

## 2014-12-30 MED ORDER — SODIUM CHLORIDE 0.9 % WEIGHT BASED INFUSION
3.0000 mL/kg/h | INTRAVENOUS | Status: AC
  Administered 2014-12-30: 3 mL/kg/h via INTRAVENOUS

## 2014-12-30 MED ORDER — NITROGLYCERIN 1 MG/10 ML FOR IR/CATH LAB
INTRA_ARTERIAL | Status: DC | PRN
  Administered 2014-12-30: 15:00:00

## 2014-12-30 MED ORDER — HEPARIN SODIUM (PORCINE) 1000 UNIT/ML IJ SOLN
INTRAMUSCULAR | Status: DC | PRN
  Administered 2014-12-30: 5000 [IU] via INTRAVENOUS

## 2014-12-30 MED ORDER — SODIUM CHLORIDE 0.9 % IJ SOLN: 3.0000 mL | Freq: Two times a day (BID) | INTRAMUSCULAR | Status: DC

## 2014-12-30 MED ORDER — MIDAZOLAM HCL 2 MG/2ML IJ SOLN
INTRAMUSCULAR | Status: AC
  Filled 2014-12-30: qty 2

## 2014-12-30 MED ORDER — HEPARIN (PORCINE) IN NACL 2-0.9 UNIT/ML-% IJ SOLN
INTRAMUSCULAR | Status: AC
  Filled 2014-12-30: qty 1000

## 2014-12-30 MED ORDER — FENTANYL CITRATE (PF) 100 MCG/2ML IJ SOLN
INTRAMUSCULAR | Status: AC
  Filled 2014-12-30: qty 2

## 2014-12-30 MED ORDER — FUROSEMIDE 10 MG/ML IJ SOLN
20.0000 mg | Freq: Two times a day (BID) | INTRAMUSCULAR | Status: DC
  Administered 2014-12-30: 20 mg via INTRAVENOUS

## 2014-12-30 MED ORDER — MORPHINE SULFATE (PF) 2 MG/ML IV SOLN: 2.0000 mg | INTRAVENOUS | Status: DC | PRN

## 2014-12-30 MED ORDER — NITROGLYCERIN 1 MG/10 ML FOR IR/CATH LAB
INTRA_ARTERIAL | Status: AC
Start: 2014-12-30 — End: 2014-12-30
  Filled 2014-12-30: qty 10

## 2014-12-30 MED ORDER — INSULIN ASPART 100 UNIT/ML ~~LOC~~ SOLN
0.0000 [IU] | Freq: Three times a day (TID) | SUBCUTANEOUS | Status: DC
  Administered 2014-12-30: 3 [IU] via SUBCUTANEOUS
  Administered 2014-12-31: 15 [IU] via SUBCUTANEOUS
  Administered 2014-12-31: 8 [IU] via SUBCUTANEOUS

## 2014-12-30 MED ORDER — SODIUM CHLORIDE 0.9 % IV SOLN: INTRAVENOUS | Status: DC

## 2014-12-30 MED ORDER — HYDROXYZINE HCL 10 MG PO TABS
10.0000 mg | ORAL_TABLET | Freq: Three times a day (TID) | ORAL | Status: DC | PRN
  Filled 2014-12-30: qty 1

## 2014-12-30 MED ORDER — ASPIRIN 81 MG PO CHEW
81.0000 mg | CHEWABLE_TABLET | Freq: Every day | ORAL | Status: DC
  Administered 2014-12-31 – 2015-01-04 (×5): 81 mg via ORAL
  Filled 2014-12-30 (×5): qty 1

## 2014-12-30 MED ORDER — LIDOCAINE HCL (PF) 1 % IJ SOLN
INTRAMUSCULAR | Status: AC
  Filled 2014-12-30: qty 30

## 2014-12-30 MED ORDER — INSULIN GLARGINE 100 UNIT/ML ~~LOC~~ SOLN
5.0000 [IU] | Freq: Every day | SUBCUTANEOUS | Status: DC
  Administered 2014-12-30: 5 [IU] via SUBCUTANEOUS
  Filled 2014-12-30 (×2): qty 0.05

## 2014-12-30 MED ORDER — VERAPAMIL HCL 2.5 MG/ML IV SOLN
INTRAVENOUS | Status: AC
Start: 2014-12-30 — End: 2014-12-30
  Filled 2014-12-30: qty 2

## 2014-12-30 MED ORDER — OXYCODONE HCL 5 MG PO TABS
ORAL_TABLET | ORAL | Status: AC
  Filled 2014-12-30: qty 2

## 2014-12-30 MED ORDER — MIDAZOLAM HCL 2 MG/2ML IJ SOLN
INTRAMUSCULAR | Status: DC | PRN
  Administered 2014-12-30: 1 mg via INTRAVENOUS

## 2014-12-30 MED ORDER — FENTANYL CITRATE (PF) 100 MCG/2ML IJ SOLN
INTRAMUSCULAR | Status: DC | PRN
  Administered 2014-12-30: 25 ug via INTRAVENOUS

## 2014-12-30 MED ORDER — NITROGLYCERIN IN D5W 200-5 MCG/ML-% IV SOLN
INTRAVENOUS | Status: AC
  Filled 2014-12-30: qty 250

## 2014-12-30 MED ORDER — SODIUM CHLORIDE 0.9 % IV SOLN: 250.0000 mL | INTRAVENOUS | Status: DC | PRN

## 2014-12-30 MED ORDER — VERAPAMIL HCL 2.5 MG/ML IV SOLN
INTRA_ARTERIAL | Status: DC | PRN
  Administered 2014-12-30: 4 mL via INTRA_ARTERIAL

## 2014-12-30 MED ORDER — ONDANSETRON HCL 4 MG/2ML IJ SOLN: 4.0000 mg | Freq: Four times a day (QID) | INTRAMUSCULAR | Status: DC | PRN

## 2014-12-30 SURGICAL SUPPLY — 12 items
CATH INFINITI 5FR ANG PIGTAIL (CATHETERS) ×2 IMPLANT
CATH OPTITORQUE TIG 4.0 5F (CATHETERS) ×2 IMPLANT
DEVICE RAD COMP TR BAND LRG (VASCULAR PRODUCTS) ×2 IMPLANT
GLIDESHEATH SLEND A-KIT 6F 22G (SHEATH) ×2 IMPLANT
KIT HEART LEFT (KITS) ×2 IMPLANT
PACK CARDIAC CATHETERIZATION (CUSTOM PROCEDURE TRAY) ×2 IMPLANT
SYR MEDRAD MARK V 150ML (SYRINGE) ×2 IMPLANT
TRANSDUCER W/STOPCOCK (MISCELLANEOUS) ×2 IMPLANT
TUBING CIL FLEX 10 FLL-RA (TUBING) ×2 IMPLANT
TUBING CONTRAST HIGH PRESS 20 (MISCELLANEOUS) ×2 IMPLANT
WIRE HI TORQ VERSACORE-J 145CM (WIRE) ×2 IMPLANT
WIRE SAFE-T 1.5MM-J .035X260CM (WIRE) ×2 IMPLANT

## 2014-12-30 NOTE — Progress Notes (Signed)
[place] TEAM 1 - Stepdown/ICU TEAM Progress Note  Brent Rasmussen ZOX:[id number] DOB: 04-28-1960 DOA: 12/14/2014 PCP: No primary care provider on file.  Admit HPI / Brief Narrative: Brent Rasmussen is a 54 y.o. WM PMHx Diabetes Type 2, HLD, CAD Native Artery, Asthma, Bell's Palsy   Presented to [facility] for abdominal pain. He was found to have pancreatitis and be in DKA. There he was provided 3.5 L bolus. He was started on an insulin drip and was transferred to [facility] for care.  HPI/Subjective: 12/8 A/O 4 , negative abdominal pain, negative N/V, negative SOB, negative DOB. States post cardiac catheterization able to ambulate around ward without symptoms. States back is still pruritic    Assessment/Plan: Severe pancreatitis/Shock: -Shock resolved - Likely secondary to a combination of DKA & hypertriglyceridemia. Completed 7 day course of imipenem.  -Advance to full liquids  -Continuing treatment of pain with fentanyl PRN  -Nutrition consult  Acute hypoxic respiratory failure: -Multifactorial to include severe shock, NSTEMI, fluid overload and acute systolic CHF - DC IV fluids at 2000 today -Continue albuterol QID PRN -12/4 PCXR; Lt Pleural Effusion? -Has resolved with diuresis  SOB -Improving w/ decrease in positive Volume load  -Monitor closely patient still volume positive. If required PRN Lasix -Resolved  NSTEMI/demand ischemia:  -Ejection fraction 30-35%. Known history of coronary artery disease. -Strict I&OSince admission; +9.8 L -Daily weight; on admission 104.3 kg            12/8 weight= 110 kg   -Coreg 12.5 mg BID -Aspirin 325 mg daily -Lipitor 40 mg daily -Avoiding IV heparin given potential for hemorrhagic transformation of pancreatitis. -Chest pain; resolved  Acute on Chronic systolic CHF -See NSTEMI -12/8S/P cardiac catheterization; multivessel high-grade stenosis; no intervention unknown chronicity see results below  Anasarca -Patient  with 3+ pitting edema to hips, enlarged girth of abdomen -Secondary to patient's soft BP will slowly diuresis, if patient tolerates will increase aggressiveness of diuresis -Lasix 20 mg BID  Acute renal failure:  -Slowly continuing to improve.  -Continuing to monitor renal output as well as trending daily BUN/creatinine.  Hypernatremia (peaked at 166):  -Resolved  Hypokalemia -Potassium goal> 4  Hypomagnesemia -Magnesium goal> 2  DM Type 2 Uncontrolled -12/3 Hemoglobin A1c= 10.9  -Lantus 5 units daily -Moderate SSI  HLD -Lipid panel not within ADA guidelines; elevated triglycerides   -Continue Lipitor 40 mg daily  Toxic metabolic encephalopathy:  -Resolved     Code Status: FULL Family Communication: no family present at time of exam Disposition Plan: Per cardiology    Consultants: Dr.Henry Malissa Hippo cardiology Dr.Jennings Harlon Ditty [facility]   Procedure/Significant Events: 11/22 MRI brain: No acute processes 11/22 CT abdomen: Moderate to severe acute pancreatitis without fluid collection, abscess or pseudocyst. 11/25 repeat CT: Worsening diffuse pancreatitis, no abscess, no cysts 11/28 TTE: EF 30-35% w/ severe hypokinesis & anterior wall motion abnormality. 11/29 KUB: DHT tip in distal duodenum 12/02 KUB: DHT w/ tip in distal duodenum or transverse segment  SIGNIFICANT EVENTS: 11/22 -Transferred to [facility] from [facility] 11/23- BM vasal vagals, cpr 2 min , ETT placed, on presors 11/24- aline placed, major improved pressors, volume given 11/25- extubated, escallating abdo pain 11/26- resp failure, re intubated 11/26- concern NSTEMI, heparin started 11/27- neg 6.4 liters 11/30- extubated 12/01- radial arterial line removed 12/02- pulled out Dobbhoff Tube had to have it replaced 12/8 left heart cath;-Ost Ramus to Ramus lesion, 95% stenosed. -Prox LAD lesion, 100% stenosed. Tx  previously with a stent (unknown type). -Ost 1st Mrg to 1st Mrg lesion, 90%  stenosed. -Mid Cx to Dist Cx lesion, 60% stenosed. -Mid RCA to Dist RCA lesion, 100% stenosed. -LVEF= 45-50% by visual estimate. Moderate low anteroapical hypokinesia  Culture BCx2 11/22: Negative   Antibiotics: Imipenem 11/24 - 11/30  DVT prophylaxis: Subcutaneous heparin and SCD   Devices    LINES / TUBES:      Continuous Infusions: . sodium chloride 60 mL/hr at 12/29/14 1709  . sodium chloride 3 mL/kg/hr (12/30/14 1645)    Objective: VITAL SIGNS: Temp: 98.8 F (37.1 C) (12/08 1252) Temp Source: Oral (12/08 1252) BP: 105/63 mmHg (12/08 1642) Pulse Rate: 150 (12/08 1642) SPO2; FIO2:   Intake/Output Summary (Last 24 hours) at 12/30/14 1828 Last data filed at 12/30/14 1800  Gross per 24 hour  Intake 1522.5 ml  Output   1075 ml  Net  447.5 ml     Exam: General: A/O 4, sitting in chair comfortably, negative acute respiratory distress  Eyes: Negative headache, eye pain, double vision,negative scleral hemorrhage ENT: Negative Runny nose, negative ear pain, negative gingival bleeding, Neck:  Negative scars, masses, torticollis, lymphadenopathy, JVD Lungs: clear to auscultation bilateral, Negative wheezes or crackles Cardiovascular: Regular rate and rhythm without murmur gallop or rub normal S1 and S2 Abdomen: Negative abdominal pain, distended, positive soft, bowel sounds, no rebound, no ascites, no appreciable mass Extremities: No significant cyanosis, clubbing. Bilateral edema to waist 3-4+ pitting,  Skin; patient has rash on back erythematous (clearing), nonraised, pruritic  Psychiatric:  Negative depression, negative anxiety, negative fatigue, negative mania  Neurologic:  Cranial nerves II through XII intact, tongue/uvula midline, all extremities muscle strength 5/5, sensation intact throughout, negative dysarthria, negative expressive aphasia, negative receptive aphasia.   Data Reviewed: Basic Metabolic Panel:  Recent Labs Lab 12/24/14 0350   12/25/14 0534  12/26/14 0518 12/26/14 2007 12/27/14 [id number] 12/27/14 [id number] 12/28/14 0534 12/29/14 0241 12/30/14 0625  NA 166*  < > 162*  < > 155* 153* 152* 149* 143 142 138  K 3.2*  < > 3.3*  < > 3.0* 3.5 3.1* 3.2* 3.0* 3.6 3.7  CL 125*  < > 126*  < > 123* 121* 121* 119* 113* 112* 106  CO2 29  < > 27  < > 25 24 24 22 23 25 23   GLUCOSE 157*  < > 128*  < > 169* 130* 157* 211* 240* 195* 228*  BUN 57*  < > 42*  < > 38* 37* 34* 32* 25* 21* 16  CREATININE 1.99*  < > 1.61*  < > 1.47* 1.35* 1.37* 1.44* 1.39* 1.30* 1.22  CALCIUM 7.6*  < > 7.8*  < > 7.8* 7.9* 7.5* 7.6* 7.4* 7.8* 8.0*  MG 2.1  --  2.1  --  1.9 1.9  --  1.9 1.7 1.9  --   PHOS 3.3  --  3.0  --  3.5  --  3.3  --   --   --   --   < > = values in this interval not displayed. Liver Function Tests:  Recent Labs Lab 12/26/14 0518 12/27/14 0227 12/27/14 0620 12/28/14 0534 12/29/14 0241  AST 33  --  43* 31 33  ALT 28  --  38 35 37  ALKPHOS 60  --  73 59 63  BILITOT 0.6  --  0.4 0.5 0.3  PROT 5.5*  --  5.5* 5.1* 5.4*  ALBUMIN 1.6* 1.6* 1.6* 1.6* 1.6*    Recent  Labs Lab 12/26/14 0518  LIPASE 22   No results for input(s): AMMONIA in the last 168 hours. CBC:  Recent Labs Lab 12/25/14 0534 12/26/14 0518 12/27/14 0620 12/28/14 0534 12/29/14 0241 12/30/14 0625  WBC 12.9* 11.4* 10.9* 7.4 7.4 6.8  NEUTROABS 11.7* 10.2* 9.5* 6.2 6.1  --   HGB 10.2* 9.7* 9.5* 8.7* 8.7* 8.5*  HCT 35.0* 32.6* 30.9* 26.5* 26.2* 26.4*  MCV 96.7 95.3 93.1 89.5 88.5 88.3  PLT 178 186 190 129* 155 164   Cardiac Enzymes:  Recent Labs Lab 12/26/14 2007 12/27/14 0227 12/27/14 0620  TROPONINI 0.34* 0.26* 0.57*   BNP (last 3 results)  Recent Labs  12/25/14 1230  BNP 404.0*    ProBNP (last 3 results) No results for input(s): PROBNP in the last 8760 hours.  CBG:  Recent Labs Lab 12/29/14 2006 12/30/14 0052 12/30/14 0359 12/30/14 0915 12/30/14 1644  GLUCAP 348* 138* 158* 236* 164*    Recent Results (from the past 240 hour(s))   C difficile quick scan w PCR reflex     Status: None   Collection Time: 12/27/14  6:00 AM  Result Value Ref Range Status   C Diff antigen NEGATIVE NEGATIVE Final   C Diff toxin NEGATIVE NEGATIVE Final   C Diff interpretation Negative for toxigenic C. difficile  Final     Studies:  Recent x-ray studies have been reviewed in detail by the Attending Physician  Scheduled Meds:  Scheduled Meds: . antiseptic oral rinse  7 mL Mouth Rinse QID  . [START ON 12/31/2014] aspirin  81 mg Oral Daily  . atorvastatin  40 mg Oral q1800  . carvedilol  12.5 mg Oral BID WC  . chlorhexidine gluconate  15 mL Mouth Rinse BID  . furosemide  20 mg Intravenous Once  . furosemide  20 mg Intravenous BID  . heparin subcutaneous  5,000 Units Subcutaneous 3 times per day  . hydrocortisone cream   Topical BID  . insulin aspart  0-15 Units Subcutaneous TID WC  . insulin glargine  5 Units Subcutaneous QHS  . pantoprazole  40 mg Oral Daily  . sodium chloride  3 mL Intravenous Q12H    Time spent on care of this patient: 40 mins   WOODS, Roselind Messier , MD  [facility] Office  [phone] Pager [phone]  On-Call/Text Page:      [email or web address]      password [email or web address]  If 7PM-7AM, please contact night-coverage [email or web address] Password [email or web address] 12/30/2014, 6:28 PM   LOS: 16 days   Care during the described time interval was provided by me .  I have reviewed this patient's available data, including medical history, events of note, physical examination, and all test results as part of my evaluation. I have personally reviewed and interpreted all radiology studies.   Carolyne Littles, MD [phone] Pager

## 2014-12-30 NOTE — Progress Notes (Addendum)
       Patient Name: Brent Rasmussen Date of Encounter: 12/30/2014    SUBJECTIVE: No events recorded overnight. Creatinine now to baseline. Has had stable anemia, no clear bleeding source. INR 1.5 - not on warfarin.  TELEMETRY:  Normal sinus rhythm on telemetry. Filed Vitals:   12/29/14 2000 12/30/14 0000 12/30/14 0414 12/30/14 0416  BP: 102/68   114/57  Pulse: 71     Temp: 99.5 F (37.5 C) 99.3 F (37.4 C)  98.8 F (37.1 C)  TempSrc: Oral Oral  Oral  Resp: 27     Height:      Weight:   242 lb 8.1 oz (110 kg)   SpO2: 98%       Intake/Output Summary (Last 24 hours) at 12/30/14 0824 Last data filed at 12/30/14 0100  Gross per 24 hour  Intake   1870 ml  Output   1551 ml  Net    319 ml   LABS: Basic Metabolic Panel:  Recent Labs  16/10/9610/06/16 0534 12/29/14 0241 12/30/14 0625  NA 143 142 138  K 3.0* 3.6 3.7  CL 113* 112* 106  CO2 23 25 23   GLUCOSE 240* 195* 228*  BUN 25* 21* 16  CREATININE 1.39* 1.30* 1.22  CALCIUM 7.4* 7.8* 8.0*  MG 1.7 1.9  --    CBC:  Recent Labs  12/28/14 0534 12/29/14 0241 12/30/14 0625  WBC 7.4 7.4 6.8  NEUTROABS 6.2 6.1  --   HGB 8.7* 8.7* 8.5*  HCT 26.5* 26.2* 26.4*  MCV 89.5 88.5 88.3  PLT 129* 155 164   Cardiac Enzymes: No results for input(s): CKTOTAL, CKMB, CKMBINDEX, TROPONINI in the last 72 hours. BNP: Invalid input(s): POCBNP Hemoglobin A1C: No results for input(s): HGBA1C in the last 72 hours. Fasting Lipid Panel: No results for input(s): CHOL, HDL, LDLCALC, TRIG, CHOLHDL, LDLDIRECT in the last 72 hours. BNP    Component Value Date/Time   BNP 404.0* 12/25/2014 1230    ProBNP No results found for: PROBNP   Radiology/Studies:  No new data  Physical Exam: Blood pressure 114/57, pulse 71, temperature 98.8 F (37.1 C), temperature source Oral, resp. rate 27, height 5\' 10"  (1.778 m), weight 242 lb 8.1 oz (110 kg), SpO2 98 %. Weight change: 4.9 oz (0.139 kg)  Wt Readings from Last 3 Encounters:  12/30/14 242  lb 8.1 oz (110 kg)    Sitting up in chair. Lungs clear Cardiac exam reveals no gallop or rub, RRR No peripheral edema  ASSESSMENT:  1. Acute on possibly chronic systolic heart failure. 2. History of ischemic heart disease with significant troponin elevations during this hospital stay. No anginal complaints. 3. Acute kidney injury, improving to net baseline. 4. Pancreatitis, improving 5. DKA, improved 6. Stable anemia  Plan:  1. Clincally improving. Will need coronary assessment[facility]  2. Continue active anti-ischemic therapy including aspirin, beta blocker, and consider long-acting nitrates as tolerated by blood     Pressure. 3. Will send iron studies and hemoccult this morning to work-up anemia further. INR 1,5 - likely due to dietary deficiency. Will administer 5 mg po vitamin K today.  Brent NoseKenneth C. Zaydyn Havey, MD, Northeast Rehabilitation HospitalFACC Attending Cardiologist [facility]  Brent Rasmussen 12/30/2014, 8:24 AM

## 2014-12-30 NOTE — H&P (View-Only) (Signed)
       Patient Name: Brent Rasmussen Date of Encounter: 12/30/2014    SUBJECTIVE: No events recorded overnight. Creatinine now to baseline. Has had stable anemia, no clear bleeding source. INR 1.5 - not on warfarin.  TELEMETRY:  Normal sinus rhythm on telemetry. Filed Vitals:   12/29/14 2000 12/30/14 0000 12/30/14 0414 12/30/14 0416  BP: 102/68   114/57  Pulse: 71     Temp: 99.5 F (37.5 C) 99.3 F (37.4 C)  98.8 F (37.1 C)  TempSrc: Oral Oral  Oral  Resp: 27     Height:      Weight:   242 lb 8.1 oz (110 kg)   SpO2: 98%       Intake/Output Summary (Last 24 hours) at 12/30/14 0824 Last data filed at 12/30/14 0100  Gross per 24 hour  Intake   1870 ml  Output   1551 ml  Net    319 ml   LABS: Basic Metabolic Panel:  Recent Labs  12/28/14 0534 12/29/14 0241 12/30/14 0625  NA 143 142 138  K 3.0* 3.6 3.7  CL 113* 112* 106  CO2 23 25 23  GLUCOSE 240* 195* 228*  BUN 25* 21* 16  CREATININE 1.39* 1.30* 1.22  CALCIUM 7.4* 7.8* 8.0*  MG 1.7 1.9  --    CBC:  Recent Labs  12/28/14 0534 12/29/14 0241 12/30/14 0625  WBC 7.4 7.4 6.8  NEUTROABS 6.2 6.1  --   HGB 8.7* 8.7* 8.5*  HCT 26.5* 26.2* 26.4*  MCV 89.5 88.5 88.3  PLT 129* 155 164   Cardiac Enzymes: No results for input(s): CKTOTAL, CKMB, CKMBINDEX, TROPONINI in the last 72 hours. BNP: Invalid input(s): POCBNP Hemoglobin A1C: No results for input(s): HGBA1C in the last 72 hours. Fasting Lipid Panel: No results for input(s): CHOL, HDL, LDLCALC, TRIG, CHOLHDL, LDLDIRECT in the last 72 hours. BNP    Component Value Date/Time   BNP 404.0* 12/25/2014 1230    ProBNP No results found for: PROBNP   Radiology/Studies:  No new data  Physical Exam: Blood pressure 114/57, pulse 71, temperature 98.8 F (37.1 C), temperature source Oral, resp. rate 27, height 5' 10" (1.778 m), weight 242 lb 8.1 oz (110 kg), SpO2 98 %. Weight change: 4.9 oz (0.139 kg)  Wt Readings from Last 3 Encounters:  12/30/14 242  lb 8.1 oz (110 kg)    Sitting up in chair. Lungs clear Cardiac exam reveals no gallop or rub, RRR No peripheral edema  ASSESSMENT:  1. Acute on possibly chronic systolic heart failure. 2. History of ischemic heart disease with significant troponin elevations during this hospital stay. No anginal complaints. 3. Acute kidney injury, improving to net baseline. 4. Pancreatitis, improving 5. DKA, improved 6. Stable anemia  Plan:  1. Clincally improving. Will need coronary assessment. Plan for [facility] today +/- PCI.  2. Continue active anti-ischemic therapy including aspirin, beta blocker, and consider long-acting nitrates as tolerated by blood     Pressure. 3. Will send iron studies and hemoccult this morning to work-up anemia further. INR 1,5 - likely due to dietary deficiency. Will administer 5 mg po vitamin K today.  Makaia Rappa C. Taylan Mayhan, MD, FACC Attending Cardiologist [facility]  Lometa Riggin C Lakresha Stifter 12/30/2014, 8:24 AM  

## 2014-12-30 NOTE — Interval H&P Note (Signed)
Cath Lab Visit (complete for each Cath Lab visit)  Clinical Evaluation Leading to the Procedure:   ACS: Yes.    Non-ACS:    Anginal Classification: CCS III  Anti-ischemic medical therapy: Minimal Therapy (1 class of medications)  Non-Invasive Test Results: No non-invasive testing performed  Prior CABG: No previous CABG      History and Physical Interval Note:  12/30/2014 2:08 PM  Brent Rasmussen  has presented today for surgery, with the diagnosis of cp  The various methods of treatment have been discussed with the patient and family. After consideration of risks, benefits and other options for treatment, the patient has consented to  Procedure(s): Left Heart Cath and Coronary Angiography (N/A) as a surgical intervention .  The patient's history has been reviewed, patient examined, no change in status, stable for surgery.  I have reviewed the patient's chart and labs.  Questions were answered to the patient's satisfaction.     Nanetta BattyBerry, Elener Custodio

## 2014-12-30 NOTE — Progress Notes (Signed)
Inpatient Diabetes Program Recommendations  AACE/ADA: New Consensus Statement on Inpatient Glycemic Control (2015)  Target Ranges:  Prepandial:   less than 140 mg/dL      Peak postprandial:   less than 180 mg/dL (1-2 hours)      Critically ill patients:  140 - 180 mg/dL   Review of Glycemic Control  Results for Brent Rasmussen, Brent Rasmussen (MRN [id number]) as of 12/30/2014 11:10  Ref. Range 12/25/2014 16:53  Hemoglobin A1C Latest Ref Range: 4.8-5.6 % 10.9 (H)  Results for Brent Rasmussen, Brent Rasmussen (MRN [id number]) as of 12/30/2014 11:10  Ref. Range 12/29/2014 16:39 12/29/2014 20:06 12/30/2014 00:52 12/30/2014 03:59 12/30/2014 09:15  Glucose-Capillary Latest Ref Range: 65-99 mg/dL 213323 (H) 086348 (H) 578138 (H) 158 (H) 236 (H)   May benefit from addition of basal insulin.  Recommendations: Add Lantus 15 units QHS.  Will continue to follow. Thank you. Ailene Ardshonda Cederick Broadnax, RD, LDN, CDE Inpatient Diabetes Coordinator [phone]

## 2014-12-31 ENCOUNTER — Encounter (HOSPITAL_COMMUNITY): Payer: Self-pay | Admitting: Cardiovascular Disease

## 2014-12-31 ENCOUNTER — Other Ambulatory Visit: Payer: Self-pay | Admitting: Student

## 2014-12-31 DIAGNOSIS — I5023 Acute on chronic systolic (congestive) heart failure: Secondary | ICD-10-CM

## 2014-12-31 DIAGNOSIS — D509 Iron deficiency anemia, unspecified: Secondary | ICD-10-CM | POA: Diagnosis present

## 2014-12-31 DIAGNOSIS — R079 Chest pain, unspecified: Secondary | ICD-10-CM

## 2014-12-31 LAB — BASIC METABOLIC PANEL
ANION GAP: 11 (ref 5–15)
BUN: 17 mg/dL (ref 6–20)
CO2: 22 mmol/L (ref 22–32)
Calcium: 8 mg/dL — ABNORMAL LOW (ref 8.9–10.3)
Chloride: 105 mmol/L (ref 101–111)
Creatinine, Ser: 1.37 mg/dL — ABNORMAL HIGH (ref 0.61–1.24)
GFR, EST NON AFRICAN AMERICAN: 57 mL/min — AB (ref >60)
GLUCOSE: 286 mg/dL — AB (ref 65–99)
POTASSIUM: 3.6 mmol/L (ref 3.5–5.1)
SODIUM: 138 mmol/L (ref 135–145)

## 2014-12-31 LAB — GLUCOSE, CAPILLARY
GLUCOSE-CAPILLARY: 266 mg/dL — AB (ref 65–99)
GLUCOSE-CAPILLARY: 353 mg/dL — AB (ref 65–99)
GLUCOSE-CAPILLARY: 245 mg/dL — AB (ref 65–99)
GLUCOSE-CAPILLARY: 246 mg/dL — AB (ref 65–99)
Glucose-Capillary: 245 mg/dL — ABNORMAL HIGH (ref 65–99)

## 2014-12-31 LAB — CBC
HCT: 25.7 % — ABNORMAL LOW (ref 39.0–52.0)
Hemoglobin: 8.7 g/dL — ABNORMAL LOW (ref 13.0–17.0)
MCH: 29.6 pg (ref 26.0–34.0)
MCHC: 33.9 g/dL (ref 30.0–36.0)
MCV: 87.4 fL (ref 78.0–100.0)
PLATELETS: 164 10*3/uL (ref 150–400)
RBC: 2.94 MIL/uL — AB (ref 4.22–5.81)
RDW: 13.5 % (ref 11.5–15.5)
WBC: 6.4 10*3/uL (ref 4.0–10.5)

## 2014-12-31 LAB — OCCULT BLOOD X 1 CARD TO LAB, STOOL: FECAL OCCULT BLD: NEGATIVE

## 2014-12-31 MED ORDER — ALBUTEROL SULFATE (2.5 MG/3ML) 0.083% IN NEBU: 2.5000 mg | INHALATION_SOLUTION | RESPIRATORY_TRACT | Status: DC | PRN

## 2014-12-31 MED ORDER — INSULIN ASPART 100 UNIT/ML ~~LOC~~ SOLN
0.0000 [IU] | Freq: Three times a day (TID) | SUBCUTANEOUS | Status: DC
  Administered 2014-12-31: 7 [IU] via SUBCUTANEOUS
  Administered 2015-01-01: 15 [IU] via SUBCUTANEOUS
  Administered 2015-01-01: 7 [IU] via SUBCUTANEOUS
  Administered 2015-01-01 – 2015-01-02 (×2): 11 [IU] via SUBCUTANEOUS
  Administered 2015-01-02 – 2015-01-03 (×5): 7 [IU] via SUBCUTANEOUS
  Administered 2015-01-04: 4 [IU] via SUBCUTANEOUS

## 2014-12-31 MED ORDER — FUROSEMIDE 20 MG PO TABS
20.0000 mg | ORAL_TABLET | Freq: Every day | ORAL | Status: DC
  Administered 2014-12-31 – 2015-01-01 (×2): 20 mg via ORAL
  Filled 2014-12-31 (×2): qty 1

## 2014-12-31 MED ORDER — ISOSORBIDE MONONITRATE ER 30 MG PO TB24
15.0000 mg | ORAL_TABLET | Freq: Every day | ORAL | Status: DC
  Administered 2014-12-31 – 2015-01-02 (×3): 15 mg via ORAL
  Filled 2014-12-31 (×3): qty 1

## 2014-12-31 MED ORDER — POLYSACCHARIDE IRON COMPLEX 150 MG PO CAPS
150.0000 mg | ORAL_CAPSULE | Freq: Two times a day (BID) | ORAL | Status: DC
  Administered 2014-12-31 – 2015-01-02 (×5): 150 mg via ORAL
  Filled 2014-12-31 (×5): qty 1

## 2014-12-31 MED ORDER — CHLORHEXIDINE GLUCONATE 0.12 % MT SOLN
OROMUCOSAL | Status: AC
  Filled 2014-12-31: qty 15

## 2014-12-31 MED ORDER — INSULIN ASPART 100 UNIT/ML ~~LOC~~ SOLN
0.0000 [IU] | Freq: Every day | SUBCUTANEOUS | Status: DC
  Administered 2014-12-31 – 2015-01-02 (×2): 2 [IU] via SUBCUTANEOUS

## 2014-12-31 MED ORDER — MORPHINE SULFATE (PF) 2 MG/ML IV SOLN: 2.0000 mg | INTRAVENOUS | Status: DC | PRN

## 2014-12-31 MED ORDER — INSULIN GLARGINE 100 UNIT/ML ~~LOC~~ SOLN
14.0000 [IU] | Freq: Every day | SUBCUTANEOUS | Status: DC
  Administered 2014-12-31: 14 [IU] via SUBCUTANEOUS
  Filled 2014-12-31 (×2): qty 0.14

## 2014-12-31 MED FILL — Nitroglycerin IV Soln 200 MCG/ML in D5W: INTRAVENOUS | Qty: 250 | Status: AC

## 2014-12-31 NOTE — Progress Notes (Signed)
[place] TEAM 1 - Stepdown/ICU TEAM PROGRESS NOTE  Brent Rasmussen ZOX:[id number] DOB: 1960-10-04 DOA: 12/14/2014 PCP: No primary care provider on file.  Admit HPI / Brief Narrative: 54 y.o. M Hx DM2, HLD, CAD, Asthma, and Bell's Palsy who presented to [facility] for abdominal pain. He was found to have pancreatitis and was in DKA. He was provided 3.5 L bolus. He was started on an insulin drip and was transferred to [facility].  Significant Events: 11/22 -Transferred to [facility] from [facility] 11/22 MRI brain: No acute processes 11/22 CT abdomen: Moderate to severe acute pancreatitis without fluid collection, abscess or pseudocyst. 11/23- with bowel movement became vaso-vagal, passed out, and reportedly had no pulse, cpr 2 min , ETT placed, on presors 11/24 early AM - intubated  11/25- extubated, escallating abdo pain 11/25 repeat CT: Worsening diffuse pancreatitis, no abscess, no cysts 11/26- resp failure, re intubated 11/26- concern NSTEMI, heparin started 11/28 TTE: EF 30-35% w/ severe hypokinesis & anterior wall motion abnormality 11/29 KUB: DHT tip in distal duodenum 11/30- extubated 12/01- radial arterial line removed 12/02 KUB: DHT w/ tip in distal duodenum or transverse segment 12/02- pulled out Dobbhoff Tube had to have it replaced 12/8 - cardiac cath - extensive multifocal CAD but not evidence of acute lesions - no intervention   HPI/Subjective: The patient is resting comfortably in a bedside chair.  He denies chest pain shortness breath fevers chills nausea or vomiting.  He is very anxious to be allowed to eat regular food.  Assessment/Plan:  Severe pancreatitis / Shock -Shock resolved -No clear etiology - pancreatitis is listed as a possible complication of both Invokana and Victoza treatment - the patient was on both of these medications prior to his admission - he asked me if I believe this could be the cause of his pancreatitis and I informed him that it  certainly appears that it could have been - we will not resume these medications at the time of his discharge -Clinically much improved/resolved - advance to regular diet today and follow  DM2  -A1c 10.9 - CBG now poorly controlled w/ advancing of diet - titrate lantus and follow trend   Acute hypoxic respiratory failure -Multifactorial to include severe shock, NSTEMI, fluid overload and acute systolic CHF -Resolved - now off supplemental oxygen  NSTEMI / demand ischemia  -Cardiology following - multivessel stenoses are noted with collaterals per cath 12/8 - outpt stress testing planned - to add long acting nitrates as BP allows   Acute on ?chronic systolic CHF - anasarca - acute pulmonary edema -Ejection fraction 30-35%, but improved to 40% at time of cath -Gentle diuresis as renal function allows  Acute renal failure -Creatinine has bumped up slightly again following cardiac cath and diuresis - recheck in a.m.  Hypernatremia -Resolved   Hypokalemia -Resolved  Toxic metabolic encephalopathy -Resolved  Diarrhea  -C diff negative - appears to have been related to tube feeding  Obesity   Code Status: FULL Family Communication: no family present at time of exam Disposition Plan: Possible discharge home in 24-48 hours depending upon progress - PT last suggested SNF  Consultants: [facility]  PCCM  Antibiotics: None currently   DVT prophylaxis: SQ heparin   Objective: Blood pressure 100/66, pulse 87, temperature 98.6 F (37 C), temperature source Oral, resp. rate 24, height  (1.778 m), weight 109.544 kg (241 lb 8 oz), SpO2 99 %.  Intake/Output Summary (Last 24 hours) at 12/31/14 1157 Last data filed  at 12/31/14 0839  Gross per 24 hour  Intake 1802.5 ml  Output   2950 ml  Net -1147.5 ml   Exam: General: No acute respiratory distress - alert  Lungs: Clear to auscultation bilaterally w/o wheeze  Cardiovascular: Regular rate and rhythm - no murmur  gallop or rub  Abdomen: Nontender, overweight, soft, bowel sounds positive, no rebound, no ascites, no appreciable mass Extremities: No significant cyanosis, or clubbing; 1+ pedal edema bilateral lower extremities  Data Reviewed: Basic Metabolic Panel:  Recent Labs Lab 12/25/14 0534  12/26/14 0518 12/26/14 2007 12/27/14 0227 12/27/14 0620 12/28/14 0534 12/29/14 0241 12/30/14 0625 12/31/14 0422  NA 162*  < > 155* 153* 152* 149* 143 142 138 138  K 3.3*  < > 3.0* 3.5 3.1* 3.2* 3.0* 3.6 3.7 3.6  CL 126*  < > 123* 121* 121* 119* 113* 112* 106 105  CO2 27  < > 25 24 24 22 23 25 23 22   GLUCOSE 128*  < > 169* 130* 157* 211* 240* 195* 228* 286*  BUN 42*  < > 38* 37* 34* 32* 25* 21* 16 17  CREATININE 1.61*  < > 1.47* 1.35* 1.37* 1.44* 1.39* 1.30* 1.22 1.37*  CALCIUM 7.8*  < > 7.8* 7.9* 7.5* 7.6* 7.4* 7.8* 8.0* 8.0*  MG 2.1  --  1.9 1.9  --  1.9 1.7 1.9  --   --   PHOS 3.0  --  3.5  --  3.3  --   --   --   --   --   < > = values in this interval not displayed.  CBC:  Recent Labs Lab 12/25/14 0534 12/26/14 0518 12/27/14 0620 12/28/14 0534 12/29/14 0241 12/30/14 0625 12/31/14 0422  WBC 12.9* 11.4* 10.9* 7.4 7.4 6.8 6.4  NEUTROABS 11.7* 10.2* 9.5* 6.2 6.1  --   --   HGB 10.2* 9.7* 9.5* 8.7* 8.7* 8.5* 8.7*  HCT 35.0* 32.6* 30.9* 26.5* 26.2* 26.4* 25.7*  MCV 96.7 95.3 93.1 89.5 88.5 88.3 87.4  PLT 178 186 190 129* 155 164 164    Liver Function Tests:  Recent Labs Lab 12/26/14 0518 12/27/14 0227 12/27/14 0620 12/28/14 0534 12/29/14 0241  AST 33  --  43* 31 33  ALT 28  --  38 35 37  ALKPHOS 60  --  73 59 63  BILITOT 0.6  --  0.4 0.5 0.3  PROT 5.5*  --  5.5* 5.1* 5.4*  ALBUMIN 1.6* 1.6* 1.6* 1.6* 1.6*    Recent Labs Lab 12/26/14 0518  LIPASE 22    Cardiac Enzymes:  Recent Labs Lab 12/26/14 2007 12/27/14 0227 12/27/14 0620  TROPONINI 0.34* 0.26* 0.57*    CBG:  Recent Labs Lab 12/30/14 0915 12/30/14 1644 12/30/14 2203 12/31/14 0737 12/31/14 1128    GLUCAP 236* 164* 244* 266* 353*    Recent Results (from the past 240 hour(s))  C difficile quick scan w PCR reflex     Status: None   Collection Time: 12/27/14  6:00 AM  Result Value Ref Range Status   C Diff antigen NEGATIVE NEGATIVE Final   C Diff toxin NEGATIVE NEGATIVE Final   C Diff interpretation Negative for toxigenic C. difficile  Final     Studies:   Recent x-ray studies have been reviewed in detail by the Attending Physician  Scheduled Meds:  Scheduled Meds: . antiseptic oral rinse  7 mL Mouth Rinse QID  . aspirin  81 mg Oral Daily  . atorvastatin  40 mg  Oral q1800  . carvedilol  12.5 mg Oral BID WC  . chlorhexidine      . chlorhexidine gluconate  15 mL Mouth Rinse BID  . furosemide  20 mg Oral Daily  . heparin subcutaneous  5,000 Units Subcutaneous 3 times per day  . hydrocortisone cream   Topical BID  . insulin aspart  0-15 Units Subcutaneous TID WC  . insulin glargine  14 Units Subcutaneous QHS  . iron polysaccharides  150 mg Oral BID  . pantoprazole  40 mg Oral Daily  . sodium chloride  3 mL Intravenous Q12H    Time spent on care of this patient: 25 mins   [facility] T , MD   [facility] Office  [phone] Pager - Text Page per Loretha Stapler as per below:  On-Call/Text Page:      [email or web address]      password [email or web address]  If 7PM-7AM, please contact night-coverage [email or web address] Password [email or web address] 12/31/2014, 11:57 AM   LOS: 17 days

## 2014-12-31 NOTE — Progress Notes (Signed)
Inpatient Diabetes Program Recommendations  AACE/ADA: New Consensus Statement on Inpatient Glycemic Control (2015)  Target Ranges:  Prepandial:   less than 140 mg/dL      Peak postprandial:   less than 180 mg/dL (1-2 hours)      Critically ill patients:  140 - 180 mg/dL   Review of Glycemic Control  Results for Brent Rasmussen, Lyndon J (MRN [id number]) as of 12/31/2014 09:49  Ref. Range 12/30/2014 09:15 12/30/2014 16:44 12/30/2014 22:03 12/31/2014 07:37  Glucose-Capillary Latest Ref Range: 65-99 mg/dL 409236 (H) 811164 (H) 914244 (H) 266 (H)    Inpatient Diabetes Program Recommendations:   Insulin - Basal: Fasting glucose 266 this am. Consider increasing Lantus to 15 units.  Thanks,  Christena DeemShannon Kenika Sahm RN, MSN, [facility] Inpatient Diabetes Coordinator Team Pager [phone] (8a-5p)

## 2014-12-31 NOTE — Discharge Instructions (Signed)
You have a Stress Test scheduled at [facility]. Your doctor has ordered this test to get a better idea of how your heart works.  Please arrive 15 minutes early for paperwork.   Location: [facility] [address] [place] [address] [phone]  Instructions:  No food/drink after midnight the night before.   No caffeine/decaf products 24 hours before, including medicines such as Excedrin or Goody Powders. Call if there are any questions.   Wear comfortable clothes and shoes.   It is OK to take your morning meds with a sip of water EXCEPT for those types of medicines listed below or otherwise instructed.  Special Medication Instructions:  Beta blockers such as metoprolol (Lopressor/Toprol XL), atenolol (Tenormin), carvedilol (Coreg), nebivolol (Bystolic), propranolol (Inderal) should not be taken for 24 hours before the test.  Calcium channel blockers such as diltiazem (Cardizem) or verapmil (Calan) should not be taken for 24 hours before the test.  Remove nitroglycerin patches and do not take nitrate preparations such as Imdur/isosorbide the day of your test.  No Persantine/Theophylline or Aggrenox medicines should be used within 24 hours of the test.   What To Expect: The whole test will take several hours. When you arrive in the lab, the technician will inject a small amount of radioactive tracer into your arm through an IV while you are resting quietly. This helps us to form pictures of your heart. You will likely only feel a sting from the IV. After a waiting period, resting pictures will be obtained under a big camera. These are the "before" pictures.  Next, you will be prepped for the stress portion of the test. This may include either walking on a treadmill or receiving a medicine that helps to dilate blood vessels in your heart to simulate the effect of exercise on your heart. If you are walking on a treadmill, you will walk at  different paces to try to get your heart rate to a goal number that is based on your age. If your doctor has chosen the pharmacologic test, then you will receive a medicine through your IV that may cause temporary nausea, flushing, shortness of breath and sometimes chest discomfort or vomiting. This is typically short-lived and usually resolves quickly. Your blood pressure and heart rate will be monitored, and we will be watching your EKG on a computer screen for any changes. During this portion of the test, the radiologist will inject another small amount of radioactive tracer into your IV. After a waiting period, you will undergo a second set of pictures. These are the "after" pictures.  The doctor reading the test will compare the before-and-after images to look for evidence of heart blockages or heart weakness. In certain instances, this test is done over 2 days but usually only takes 1 day to complete.

## 2014-12-31 NOTE — Progress Notes (Addendum)
Patient Name: Brent Rasmussen Date of Encounter: 12/31/2014    SUBJECTIVE: Cath results noted yesterday - reviewed films and discussed with Dr. Allyson Sabal. Culprit for NSTEMI may have been LAD, but it is now homo collateralized. He is not having angina at this time. EF is somewhat improved to 45-50%. Cath results are as follows:  Mr. Carton has an occluded LAD after the previously placed stent with left to left collaterals, codominant circumflex with moderate disease in the AV groove. He has high-grade small ramus branch stenosis and high-grade proximal and small first OM branch stenosis. His codominant right is occluded in the midportion with faint left-to-right collaterals. His EF is 45% with anteroapical wall motion abnormality, better than what the 2-D echo suggested and his LVEDP was low at 13. It is unclear the acuity of these blockages but they appear to be chronic and therefore no current indication to perform intervention.   TELEMETRY:  Normal sinus rhythm on telemetry. Filed Vitals:   12/30/14 1742 12/30/14 2129 12/31/14 0015 12/31/14 0400  BP: 113/71  93/63 115/70  Pulse: 93  91 88  Temp:  98.3 F (36.8 C) 98.3 F (36.8 C) 98.6 F (37 C)  TempSrc:  Oral Oral Oral  Resp: Height:      Weight:    241 lb 8 oz (109.544 kg)  SpO2: 97%  97% 97%    Intake/Output Summary (Last 24 hours) at 12/31/14 0747 Last data filed at 12/31/14 0454  Gross per 24 hour  Intake 1342.5 ml  Output   2200 ml  Net -857.5 ml   LABS: Basic Metabolic Panel:  Recent Labs  16/10/96 0241 12/30/14 0625 12/31/14 0422  NA 142 138 138  K 3.6 3.7 3.6  CL 112* 106 105  CO2 GLUCOSE 195* 228* 286*  BUN 21* 16 17  CREATININE 1.30* 1.22 1.37*  CALCIUM 7.8* 8.0* 8.0*  MG 1.9  --   --    CBC:  Recent Labs  12/29/14 0241 12/30/14 0625 12/31/14 0422  WBC 7.4 6.8 6.4  NEUTROABS 6.1  --   --   HGB 8.7* 8.5* 8.7*  HCT 26.2* 26.4* 25.7*  MCV 88.5 88.3 87.4  PLT 155  164 164   Cardiac Enzymes: No results for input(s): CKTOTAL, CKMB, CKMBINDEX, TROPONINI in the last 72 hours. BNP: Invalid input(s): POCBNP Hemoglobin A1C: No results for input(s): HGBA1C in the last 72 hours. Fasting Lipid Panel: No results for input(s): CHOL, HDL, LDLCALC, TRIG, CHOLHDL, LDLDIRECT in the last 72 hours. BNP    Component Value Date/Time   BNP 404.0* 12/25/2014 1230    ProBNP No results found for: PROBNP   Radiology/Studies:  No new data  Physical Exam: Blood pressure 115/70, pulse 88, temperature 98.6 F (37 C), temperature source Oral, resp. rate 24, height  (1.778 m), weight 241 lb 8 oz (109.544 kg), SpO2 97 %. Weight change: -1 lb 0.1 oz (-0.456 kg)  Wt Readings from Last 3 Encounters:  12/31/14 241 lb 8 oz (109.544 kg)    Sitting up in chair. Lungs clear Cardiac exam reveals no gallop or rub, RRR No peripheral edema  ASSESSMENT:  1. Acute on possibly chronic systolic heart failure - EF 30-35% on echo, now improved to 45-50% by cath. 2. Multivessel CAD by cath, occluded proximal LAD after stent - collateralized. No anginal complaints. 3. Acute kidney injury, improving to net baseline. 4. Pancreatitis, improving 5. DKA,  improved 6. Iron-deficient anemia  Plan:  1. NSTEMI - Multivessel stenoses are noted with collaterals. Burden of ischemia is not clear for intervention. Would recommend outpatient stress testing to evaluate for areas of significant ischemia that could be targets for intervention. Continue active anti-ischemic therapy including aspirin, beta blocker, and consider long-acting nitrates as tolerated by blood pressure 2. Acute systolic congestive HF: EF improved to 45-50%, change lasix to 20 mg po today. D/c IVF's. 3. Iron deficiency anemia- start Nu-Iron 150 mg BID. Stool card pending.  Cardiology follow-up in 5-7 days after discharge with MLP. I'm happy to see after stress testing to discuss possible interventional options if  necessary. Will sign-off. Call with questions.  Chrystie NoseKenneth C. Clorene Nerio, MD, [place] HouseFACC Attending Cardiologist [facility]  Chrystie NoseKenneth C Zayven Powe 12/31/2014, 7:47 AM

## 2014-12-31 NOTE — Clinical Social Work Note (Signed)
Clinical Social Worker met with patient at bedside to offer support and discuss patient needs at discharge.  Patient adamantly states that he will be returning home with his wife at discharge.  Patient is agreeable to home health with [facility] - CM and charge RN notified.  Patient with adequate transportation at discharge.  Clinical Social Worker will sign off for now as social work intervention is no longer needed. Please consult [place] again if new need arises.  Barbette Or, [place]

## 2014-12-31 NOTE — Progress Notes (Signed)
UR COMPLETED  

## 2014-12-31 NOTE — Progress Notes (Signed)
Nutrition Follow-up  DOCUMENTATION CODES:   Obesity unspecified  INTERVENTION:    Diet advancement per MD as tolerated.  Pancreatitis diet education provided.  NUTRITION DIAGNOSIS:   Increased nutrient needs related to acute illness as evidenced by estimated needs.  Ongoing  GOAL:   Patient will meet greater than or equal to 90% of their needs  Progressing, currently not met  MONITOR:   Diet advancement, PO intake, Labs, Weight trends  ASSESSMENT:   54 y/o male PMHx DM, CAD, Bell's Palsy who presented to hospital for abdominal pain and nausea. Found to have pancreatitis and be in DKA. Endorses c/n/v and poor PO intake. 11/23 code blue called. CPR x2 min with cpr rosc after 2 min. Intubated on 11/24 due to worsening respiratory status. Extubated 11/25. Reintubated 11/26. Cortrak placed 11/26  Diet has been advanced to full liquids, patient is hopeful for diet advancement to solid foods today. He is tolerating full liquids well and is glad that the feeding tube is out. Provided "Pancreatitis Nutrition Therapy" handout from the Academy of Nutrition and Dietetics. Reviewed ways to decrease fat intake and foods to avoid for pancreatitis. Answered patient's questions about CHO modified diet as well. Patient was very receptive and appreciative of RD education and handouts.  Diet Order:  Diet full liquid Room service appropriate?: Yes; Fluid consistency:: Thin  Skin:  Reviewed, no issues  Last BM:  12/7  Height:   Ht Readings from Last 1 Encounters:  12/15/14 _0  (1.778 m)    Weight:   Wt Readings from Last 1 Encounters:  12/31/14 241 lb 8 oz (109.544 kg)    Ideal Body Weight:  75.45 kg  BMI:  Body mass index is 34.65 kg/(m^2).  Estimated Nutritional Needs:   Kcal:  4068-4033  Protein:  110-125 gm  Fluid:  2 L  EDUCATION NEEDS:   Education needs addressed  Molli Barrows, [place], LDN, [place] Pager [phone] After Hours Pager [phone]

## 2014-12-31 NOTE — Progress Notes (Signed)
Physical Therapy Treatment Patient Details Name: Brent ShamesDaniel J Rasmussen MRN: [id number] DOB: 07-22-1960 Today's Date: 12/31/2014    History of Present Illness Patient is a 54 y/o male who presents with abdominal pain and found to have pancreatitis and be in DKA. s/p cardiac arrest 11/23, intubated x2, concern for NSTEMI, extubated 11/30. PMH includes T2DM, CAD ([facility]) and asthma.     PT Comments    Pt states he feels nauseous but is agreeable to therex in recliner chair. Pt and wife were educated on importance of continuing an exercise program at home. Pt understands therex included in HEP.  Follow Up Recommendations  SNF     Equipment Recommendations  Other (comment) (TBD)    Recommendations for Other Services       Precautions / Restrictions Precautions Precautions: Fall Restrictions Weight Bearing Restrictions: No    Mobility  Bed Mobility                  Transfers                    Ambulation/Gait                 Stairs            Wheelchair Mobility    Modified Rankin (Stroke Patients Only)       Balance                                    Cognition Arousal/Alertness: Awake/alert Behavior During Therapy: WFL for tasks assessed/performed Overall Cognitive Status: Within Functional Limits for tasks assessed                      Exercises Total Joint Exercises Towel Squeeze: Both;Other reps (comment);Strengthening (2 x 20) General Exercises - Lower Extremity Ankle Circles/Pumps: Other reps (comment);Both;Right (2 x 20) Quad Sets: Both;Other reps (comment);Strengthening (2 x 20) Short Arc Quad: Strengthening;Both;Other reps (comment) (2 x 20) Heel Slides: Strengthening;Both;Other reps (comment) (2 x 20) Hip ABduction/ADduction: Strengthening;Both;Other reps (comment) (2 x 20)    General Comments        Pertinent Vitals/Pain Pain Assessment: No/denies pain    Home  Living                      Prior Function            PT Goals (current goals can now be found in the care plan section) Progress towards PT goals: Progressing toward goals    Frequency  Min 3X/week    PT Plan Current plan remains appropriate    Co-evaluation             End of Session   Activity Tolerance:  (pt limited by nausea) Patient left: in chair;with call bell/phone within reach;with chair alarm set;with family/visitor present     Time: [id number] PT Time Calculation (min) (ACUTE ONLY): 24 min  Charges:  $Therapeutic Exercise: 23-37 mins                    G Codes:      Brent Rasmussen 12/31/2014, 1:54 PM

## 2015-01-01 LAB — COMPREHENSIVE METABOLIC PANEL
ALT: 28 U/L (ref 17–63)
ANION GAP: 9 (ref 5–15)
AST: 20 U/L (ref 15–41)
Albumin: 1.6 g/dL — ABNORMAL LOW (ref 3.5–5.0)
Alkaline Phosphatase: 59 U/L (ref 38–126)
BUN: 15 mg/dL (ref 6–20)
CHLORIDE: 101 mmol/L (ref 101–111)
CO2: 25 mmol/L (ref 22–32)
Calcium: 8 mg/dL — ABNORMAL LOW (ref 8.9–10.3)
Creatinine, Ser: 1.25 mg/dL — ABNORMAL HIGH (ref 0.61–1.24)
Glucose, Bld: 272 mg/dL — ABNORMAL HIGH (ref 65–99)
POTASSIUM: 3.6 mmol/L (ref 3.5–5.1)
Sodium: 135 mmol/L (ref 135–145)
Total Bilirubin: 0.7 mg/dL (ref 0.3–1.2)
Total Protein: 5.1 g/dL — ABNORMAL LOW (ref 6.5–8.1)

## 2015-01-01 LAB — GLUCOSE, CAPILLARY
GLUCOSE-CAPILLARY: 218 mg/dL — AB (ref 65–99)
GLUCOSE-CAPILLARY: 191 mg/dL — AB (ref 65–99)
Glucose-Capillary: 273 mg/dL — ABNORMAL HIGH (ref 65–99)
Glucose-Capillary: 315 mg/dL — ABNORMAL HIGH (ref 65–99)

## 2015-01-01 LAB — CBC
HEMATOCRIT: 25.2 % — AB (ref 39.0–52.0)
Hemoglobin: 8.2 g/dL — ABNORMAL LOW (ref 13.0–17.0)
MCH: 28.3 pg (ref 26.0–34.0)
MCHC: 32.5 g/dL (ref 30.0–36.0)
MCV: 86.9 fL (ref 78.0–100.0)
Platelets: 186 10*3/uL (ref 150–400)
RBC: 2.9 MIL/uL — AB (ref 4.22–5.81)
RDW: 13.3 % (ref 11.5–15.5)
WBC: 6 10*3/uL (ref 4.0–10.5)

## 2015-01-01 MED ORDER — FUROSEMIDE 40 MG PO TABS
40.0000 mg | ORAL_TABLET | Freq: Two times a day (BID) | ORAL | Status: DC
Start: 2015-01-01 — End: 2015-01-02
  Administered 2015-01-01 – 2015-01-02 (×2): 40 mg via ORAL
  Filled 2015-01-01 (×2): qty 1

## 2015-01-01 MED ORDER — INSULIN GLARGINE 100 UNIT/ML ~~LOC~~ SOLN
14.0000 [IU] | Freq: Two times a day (BID) | SUBCUTANEOUS | Status: DC
  Administered 2015-01-01 – 2015-01-02 (×2): 14 [IU] via SUBCUTANEOUS
  Filled 2015-01-01 (×3): qty 0.14

## 2015-01-01 MED ORDER — INSULIN ASPART 100 UNIT/ML ~~LOC~~ SOLN
4.0000 [IU] | Freq: Three times a day (TID) | SUBCUTANEOUS | Status: DC
  Administered 2015-01-01 – 2015-01-03 (×5): 4 [IU] via SUBCUTANEOUS

## 2015-01-01 MED ORDER — CARVEDILOL 6.25 MG PO TABS
6.2500 mg | ORAL_TABLET | Freq: Two times a day (BID) | ORAL | Status: DC
  Administered 2015-01-01 – 2015-01-04 (×6): 6.25 mg via ORAL
  Filled 2015-01-01 (×6): qty 1

## 2015-01-01 MED ORDER — INSULIN GLARGINE 100 UNIT/ML ~~LOC~~ SOLN
22.0000 [IU] | Freq: Every day | SUBCUTANEOUS | Status: DC
  Filled 2015-01-01: qty 0.22

## 2015-01-01 NOTE — Plan of Care (Signed)
Problem: Health Behavior/Discharge Planning: Goal: Ability to manage health-related needs will improve Outcome: Progressing Pt improving with the Knowledge of new medication regimen, Will continue to assess

## 2015-01-01 NOTE — Progress Notes (Signed)
[place] TEAM 1 - Stepdown/ICU TEAM PROGRESS NOTE  Brent Rasmussen ZOX:[id number] DOB: Apr 11, 1960 DOA: 12/14/2014 PCP: No primary care provider on file.  Admit HPI / Brief Narrative: 54 y.o. M Hx DM2, HLD, CAD, Asthma, and Bell's Palsy who presented to [facility] for abdominal pain. He was found to have pancreatitis and was in DKA. He was provided 3.5 L bolus. He was started on an insulin drip and was transferred to [facility].  Significant Events: 11/22 -Transferred to [facility] from [facility] 11/22 MRI brain: No acute processes 11/22 CT abdomen: Moderate to severe acute pancreatitis without fluid collection, abscess or pseudocyst. 11/23- with bowel movement became vaso-vagal, passed out, and reportedly had no pulse, cpr 2 min , ETT placed, on presors 11/24 early AM - intubated  11/25- extubated, escallating abdo pain 11/25 repeat CT: Worsening diffuse pancreatitis, no abscess, no cysts 11/26- resp failure, re intubated 11/26- concern NSTEMI, heparin started 11/28 TTE: EF 30-35% w/ severe hypokinesis & anterior wall motion abnormality 11/29 KUB: DHT tip in distal duodenum 11/30- extubated 12/01- radial arterial line removed 12/02 KUB: DHT w/ tip in distal duodenum or transverse segment 12/02- pulled out Dobbhoff Tube had to have it replaced 12/8 - cardiac cath - extensive multifocal CAD but not evidence of acute lesions - no intervention   HPI/Subjective: The patient is resting comfortably in a bedside chair.  He denies chest pain shortness breath fevers chills nausea or vomiting.  He is tolerating his current diet without any difficulty.  He has numerous questions, all of which I have taken the time to address.  Assessment/Plan:  Severe pancreatitis / Shock -Shock resolved -No clear etiology - pancreatitis is listed as a possible complication of both Invokana and Victoza treatment - the patient was on both of these medications prior to his admission - we will not resume these  medications at the time of his discharge -Clinically much improved/resolved - advanced to regular diet w/o difficulty   DM2  -A1c 10.9 - CBG remains poorly controlled - discussed patient's prior treatment regimens with him - will adjust Lantus and change its dosing to twice a day - continue sliding scale - add meal coverage and follow   Acute hypoxic respiratory failure -Multifactorial to include severe shock, NSTEMI, fluid overload and acute systolic CHF -Resolved - now off supplemental oxygen  NSTEMI / demand ischemia  -Cardiology has evaluated - multivessel stenoses were noted with collaterals per cath 12/8 - outpt stress testing planned - added long acting nitrate  Acute on ?chronic systolic CHF - anasarca - acute pulmonary edema -Ejection fraction 30-35%, but improved to 40% at time of cath -cont w/ attempts to gently diurese   Acute renal failure -Creatinine improving - resume diuretic and follow   Hypernatremia -Resolved   Hypokalemia -Resolved  Toxic metabolic encephalopathy -Resolved  Diarrhea  -C diff negative - appears to have been related to tube feeding  Obesity   Code Status: FULL Family Communication: no family present at time of exam Disposition Plan: Possible discharge home in 24-48 hours depending upon progress - PT last suggested SNF but pt has refused and will be going home w/ his wife   Consultants: [facility] Cardiology  PCCM  Antibiotics: None currently   DVT prophylaxis: SQ heparin   Objective: Blood pressure 101/54, pulse 86, temperature 98 F (36.7 C), temperature source Oral, resp. rate 18, height  (1.778 m), weight 110.315 kg (243 lb 3.2 oz), SpO2 97 %.  Intake/Output Summary (Last  24 hours) at 01/01/15 1332 Last data filed at 01/01/15 1227  Gross per 24 hour  Intake    720 ml  Output   3075 ml  Net  -2355 ml   Exam: General: No acute respiratory distress - alert and pleasant  Lungs: Clear to auscultation bilaterally w/o  wheeze  Cardiovascular: RRR w/o gallup or M Abdomen: Nontender, overweight, soft, bowel sounds positive, no rebound, no ascites, no appreciable mass Extremities: No significant cyanosis, or clubbing; 1+ pedal edema bilateral lower extremities persists  Data Reviewed: Basic Metabolic Panel:  Recent Labs Lab 12/26/14 0518 12/26/14 2007 12/27/14 0227 12/27/14 0620 12/28/14 0534 12/29/14 0241 12/30/14 0625 12/31/14 0422 01/01/15 0335  NA 155* 153* 152* 149* 143 142 138 138 135  K 3.0* 3.5 3.1* 3.2* 3.0* 3.6 3.7 3.6 3.6  CL 123* 121* 121* 119* 113* 112* 106 105 101  CO2 25 24 24 22 23 25 23 22 25   GLUCOSE 169* 130* 157* 211* 240* 195* 228* 286* 272*  BUN 38* 37* 34* 32* 25* 21* CREATININE 1.47* 1.35* 1.37* 1.44* 1.39* 1.30* 1.22 1.37* 1.25*  CALCIUM 7.8* 7.9* 7.5* 7.6* 7.4* 7.8* 8.0* 8.0* 8.0*  MG 1.9 1.9  --  1.9 1.7 1.9  --   --   --   PHOS 3.5  --  3.3  --   --   --   --   --   --     CBC:  Recent Labs Lab 12/26/14 0518 12/27/14 0620 12/28/14 0534 12/29/14 0241 12/30/14 0625 12/31/14 0422 01/01/15 0335  WBC 11.4* 10.9* 7.4 7.4 6.8 6.4 6.0  NEUTROABS 10.2* 9.5* 6.2 6.1  --   --   --   HGB 9.7* 9.5* 8.7* 8.7* 8.5* 8.7* 8.2*  HCT 32.6* 30.9* 26.5* 26.2* 26.4* 25.7* 25.2*  MCV 95.3 93.1 89.5 88.5 88.3 87.4 86.9  PLT 186 190 129* 155 164 164 186    Liver Function Tests:  Recent Labs Lab 12/26/14 0518 12/27/14 0227 12/27/14 0620 12/28/14 0534 12/29/14 0241 01/01/15 0335  AST 33  --  43* 31 33 20  ALT 28  --  38 35 37 28  ALKPHOS 60  --  73 59 63 59  BILITOT 0.6  --  0.4 0.5 0.3 0.7  PROT 5.5*  --  5.5* 5.1* 5.4* 5.1*  ALBUMIN 1.6* 1.6* 1.6* 1.6* 1.6* 1.6*    Recent Labs Lab 12/26/14 0518  LIPASE 22    Cardiac Enzymes:  Recent Labs Lab 12/26/14 2007 12/27/14 0227 12/27/14 0620  TROPONINI 0.34* 0.26* 0.57*    CBG:  Recent Labs Lab 12/31/14 1454 12/31/14 1644 12/31/14 2114 01/01/15 0721 01/01/15 1136  GLUCAP 245* 245* 246*  273* 315*    Recent Results (from the past 240 hour(s))  C difficile quick scan w PCR reflex     Status: None   Collection Time: 12/27/14  6:00 AM  Result Value Ref Range Status   C Diff antigen NEGATIVE NEGATIVE Final   C Diff toxin NEGATIVE NEGATIVE Final   C Diff interpretation Negative for toxigenic C. difficile  Final     Studies:   Recent x-ray studies have been reviewed in detail by the Attending Physician  Scheduled Meds:  Scheduled Meds: . antiseptic oral rinse  7 mL Mouth Rinse QID  . aspirin  81 mg Oral Daily  . atorvastatin  40 mg Oral q1800  . carvedilol  12.5 mg Oral BID WC  . chlorhexidine gluconate  15  mL Mouth Rinse BID  . furosemide  20 mg Oral Daily  . heparin subcutaneous  5,000 Units Subcutaneous 3 times per day  . hydrocortisone cream   Topical BID  . insulin aspart  0-20 Units Subcutaneous TID WC  . insulin aspart  0-5 Units Subcutaneous QHS  . insulin glargine  14 Units Subcutaneous QHS  . iron polysaccharides  150 mg Oral BID  . isosorbide mononitrate  15 mg Oral Daily  . pantoprazole  40 mg Oral Daily    Time spent on care of this patient: 25 mins   [facility] Center[facility]CCLUNG,Dalayah Deahl T , MD   [facility] Office  [phone] Pager - Text Page per Loretha StaplerAmion as per below:  On-Call/Text Page:      [email or web address]      password [email or web address]  If 7PM-7AM, please contact night-coverage [email or web address] Password [email or web address] 01/01/2015, 1:32 PM   LOS: 18 days

## 2015-01-02 LAB — COMPREHENSIVE METABOLIC PANEL
ALBUMIN: 1.6 g/dL — AB (ref 3.5–5.0)
ALT: 24 U/L (ref 17–63)
AST: 23 U/L (ref 15–41)
Alkaline Phosphatase: 58 U/L (ref 38–126)
Anion gap: 11 (ref 5–15)
BUN: 13 mg/dL (ref 6–20)
CHLORIDE: 98 mmol/L — AB (ref 101–111)
CO2: 26 mmol/L (ref 22–32)
Calcium: 7.9 mg/dL — ABNORMAL LOW (ref 8.9–10.3)
Creatinine, Ser: 1.28 mg/dL — ABNORMAL HIGH (ref 0.61–1.24)
GFR calc Af Amer: >60 mL/min (ref >60)
Glucose, Bld: 260 mg/dL — ABNORMAL HIGH (ref 65–99)
POTASSIUM: 3.1 mmol/L — AB (ref 3.5–5.1)
Sodium: 135 mmol/L (ref 135–145)
Total Bilirubin: 0.7 mg/dL (ref 0.3–1.2)
Total Protein: 5.2 g/dL — ABNORMAL LOW (ref 6.5–8.1)

## 2015-01-02 LAB — GLUCOSE, CAPILLARY
GLUCOSE-CAPILLARY: 249 mg/dL — AB (ref 65–99)
GLUCOSE-CAPILLARY: 238 mg/dL — AB (ref 65–99)
Glucose-Capillary: 271 mg/dL — ABNORMAL HIGH (ref 65–99)
Glucose-Capillary: 212 mg/dL — ABNORMAL HIGH (ref 65–99)

## 2015-01-02 LAB — CBC
HCT: 24.4 % — ABNORMAL LOW (ref 39.0–52.0)
Hemoglobin: 8.3 g/dL — ABNORMAL LOW (ref 13.0–17.0)
MCH: 29.2 pg (ref 26.0–34.0)
MCHC: 34 g/dL (ref 30.0–36.0)
MCV: 85.9 fL (ref 78.0–100.0)
PLATELETS: 185 10*3/uL (ref 150–400)
RBC: 2.84 MIL/uL — ABNORMAL LOW (ref 4.22–5.81)
RDW: 13.4 % (ref 11.5–15.5)
WBC: 6.3 10*3/uL (ref 4.0–10.5)

## 2015-01-02 LAB — LIPASE, BLOOD: LIPASE: 20 U/L (ref 11–51)

## 2015-01-02 MED ORDER — ISOSORBIDE MONONITRATE ER 30 MG PO TB24
15.0000 mg | ORAL_TABLET | Freq: Every day | ORAL | Status: DC
  Administered 2015-01-03 – 2015-01-04 (×2): 15 mg via ORAL
  Filled 2015-01-02 (×2): qty 1

## 2015-01-02 MED ORDER — INSULIN GLARGINE 100 UNIT/ML ~~LOC~~ SOLN
22.0000 [IU] | Freq: Two times a day (BID) | SUBCUTANEOUS | Status: DC
  Administered 2015-01-02 – 2015-01-04 (×4): 22 [IU] via SUBCUTANEOUS
  Filled 2015-01-02 (×5): qty 0.22

## 2015-01-02 MED ORDER — POTASSIUM CHLORIDE CRYS ER 20 MEQ PO TBCR
40.0000 meq | EXTENDED_RELEASE_TABLET | Freq: Once | ORAL | Status: AC
  Administered 2015-01-02: 40 meq via ORAL
  Filled 2015-01-02: qty 2

## 2015-01-02 MED ORDER — SODIUM CHLORIDE 0.9 % IV SOLN
510.0000 mg | Freq: Once | INTRAVENOUS | Status: AC
  Administered 2015-01-02: 510 mg via INTRAVENOUS
  Filled 2015-01-02: qty 17

## 2015-01-02 NOTE — Care Management Note (Signed)
Case Management Note  Patient Details  Name: Brent Rasmussen MRN: [id number] Date of Birth: 25-Feb-1960  Subjective/Objective:                  DKA, pancreatitis  Action/Plan: CM spoke with the patient at the bedside. States he does not want to go to SNF. Discussed plan for discharge home with home health services. [facility] Patient provided with a list of HHC agencies. He selected [facility]. States they are closest to him. [facility] Amy does not have the patient on their list for HHC. [facility]   Expected Discharge Date:  12/24/14               Expected Discharge Plan:  Home w Home Health Services  In-House Referral:     Discharge planning Services  CM Consult  Post Acute Care Choice:  Home Health Choice offered to:  Patient  DME Arranged:   [facility] DME Agency:   [facility]  HH Arranged:  PT HH Agency:  [facility]  Status of Service:  Completed, signed off  Medicare Important Message Given:    Date Medicare IM Given:    Medicare IM give by:    Date Additional Medicare IM Given:    Additional Medicare Important Message give by:     If discussed at Long Length of Stay Meetings, dates discussed:    Additional Comments:  Antony HasteBennett, Richie Vadala Harris, RN 01/02/2015, 11:56 AM

## 2015-01-02 NOTE — Progress Notes (Signed)
[place] TEAM 1 - Stepdown/ICU TEAM PROGRESS NOTE  Leotis ShamesDaniel J Shahan ZOX:[id number] DOB: Aug 15, 1960 DOA: 12/14/2014 PCP: No primary care provider on file.  Admit HPI / Brief Narrative: 54 y.o. M Hx DM2, HLD, CAD, Asthma, and Bell's Palsy who presented to [facility] for abdominal pain. He was found to have pancreatitis and was in DKA. He was provided 3.5 L bolus. He was started on an insulin drip and [facility]  Significant Events: 11/22 -[facility] 11/22 MRI brain: No acute processes 11/22 CT abdomen: Moderate to severe acute pancreatitis without fluid collection, abscess or pseudocyst. 11/23- with bowel movement became vaso-vagal, passed out, and reportedly had no pulse, cpr 2 min , ETT placed, on presors 11/24 early AM - intubated  11/25- extubated, escallating abdo pain 11/25 repeat CT: Worsening diffuse pancreatitis, no abscess, no cysts 11/26- resp failure, re intubated 11/26- concern NSTEMI, heparin started 11/28 TTE: EF 30-35% w/ severe hypokinesis & anterior wall motion abnormality 11/29 KUB: DHT tip in distal duodenum 11/30- extubated 12/01- radial arterial line removed 12/02 KUB: DHT w/ tip in distal duodenum or transverse segment 12/02- pulled out Dobbhoff Tube had to have it replaced 12/8 - cardiac cath - extensive multifocal CAD but not evidence of acute lesions - no intervention   HPI/Subjective: Pt c/o severe nausea, GI upset, and constipation related to oral Fe dosing.  He has many questions concerning his tx plan, all of which I have taken the time to answer at length.  He denies abdom pain, sob, ha, or cp.    Assessment/Plan:  Severe pancreatitis / Shock -Shock resolved -No clear etiology - pancreatitis is listed as a possible complication of both Invokana and Victoza treatment - the patient was on both of these medications prior to his admission - we will not resume these medications at the time of his  discharge -Clinically much improved/resolved - advanced to regular diet w/o difficulty   DM2  -A1c 10.9 - CBG remains poorly controlled - discussed patient's prior treatment regimens with him - will adjust Lantus again today - continue sliding scale and meal coverage and follow - counseled pt on need to keep strict journal of his CBGs at home and possibility of decreasing insulin needs as pancreas improves further w/ time   Acute hypoxic respiratory failure -Multifactorial to include severe shock, NSTEMI, fluid overload and acute systolic CHF -resolved - now off supplemental oxygen  NSTEMI / demand ischemia  -Cardiology has evaluated - multivessel stenoses were noted with collaterals per cath 12/8 - outpt stress testing planned - added long acting nitrate - BP marginal but acceptable for now   Acute on ?chronic systolic CHF - anasarca - acute pulmonary edema -Ejection fraction 30-35%, but improved to 40% at time of cath -hold attempts to diurese further in setting of hypotension   Acute renal failure -Creatinine climbing slightly - hold diuretic today and follow   Hypernatremia -Resolved   Hypokalemia -due to diuretic - supplement and follow   Toxic metabolic encephalopathy -Resolved  Diarrhea  -C diff negative - appears to have been related to tube feeding  Obesity - Body mass index is 34.49 kg/(m^2).  Code Status: FULL Family Communication: spoke w/ wife at bedside at length  Disposition Plan: Possible discharge home in 24-48 hours depending upon progress - PT last suggested SNF but pt has refused and will be going home w/ his wife   Consultants: [facility] Cardiology  PCCM  Antibiotics: None  currently   DVT prophylaxis: SQ heparin   Objective: Blood pressure 106/56, pulse 88, temperature 98.2 F (36.8 C), temperature source Oral, resp. rate 19, height  (1.778 m), weight 109.045 kg (240 lb 6.4 oz), SpO2 97 %.  Intake/Output Summary (Last 24 hours) at 01/02/15  1345 Last data filed at 01/02/15 1242  Gross per 24 hour  Intake   1920 ml  Output   3600 ml  Net  -1680 ml   Exam: General: No acute respiratory distress - alert  Lungs: Clear to auscultation bilaterally  Cardiovascular: RRR w/o gallup or M Abdomen: Nontender, obese, soft, bowel sounds positive, no rebound, no ascites, no appreciable mass Extremities: No significant cyanosis, or clubbing; 1+ pedal edema bilateral lower extremities persists  Data Reviewed: Basic Metabolic Panel:  Recent Labs Lab 12/26/14 2007 12/27/14 0227 12/27/14 0620 12/28/14 0534 12/29/14 0241 12/30/14 0625 12/31/14 0422 01/01/15 0335 01/02/15 0323  NA 153* 152* 149* 143 142 138 138 135 135  K 3.5 3.1* 3.2* 3.0* 3.6 3.7 3.6 3.6 3.1*  CL 121* 121* 119* 113* 112* 106 105 101 98*  CO2 24 24 22 23 25 23 22 25 26   GLUCOSE 130* 157* 211* 240* 195* 228* 286* 272* 260*  BUN 37* 34* 32* 25* 21* CREATININE 1.35* 1.37* 1.44* 1.39* 1.30* 1.22 1.37* 1.25* 1.28*  CALCIUM 7.9* 7.5* 7.6* 7.4* 7.8* 8.0* 8.0* 8.0* 7.9*  MG 1.9  --  1.9 1.7 1.9  --   --   --   --   PHOS  --  3.3  --   --   --   --   --   --   --     CBC:  Recent Labs Lab 12/27/14 0620 12/28/14 0534 12/29/14 0241 12/30/14 0625 12/31/14 0422 01/01/15 0335 01/02/15 0323  WBC 10.9* 7.4 7.4 6.8 6.4 6.0 6.3  NEUTROABS 9.5* 6.2 6.1  --   --   --   --   HGB 9.5* 8.7* 8.7* 8.5* 8.7* 8.2* 8.3*  HCT 30.9* 26.5* 26.2* 26.4* 25.7* 25.2* 24.4*  MCV 93.1 89.5 88.5 88.3 87.4 86.9 85.9  PLT 190 129* 155 164 164 186 185    Liver Function Tests:  Recent Labs Lab 12/27/14 0620 12/28/14 0534 12/29/14 0241 01/01/15 0335 01/02/15 0323  AST 43* 31 33 20 23  ALT 38 35 37 28 24  ALKPHOS 73 59 63 59 58  BILITOT 0.4 0.5 0.3 0.7 0.7  PROT 5.5* 5.1* 5.4* 5.1* 5.2*  ALBUMIN 1.6* 1.6* 1.6* 1.6* 1.6*    Recent Labs Lab 01/02/15 0323  LIPASE 20    Cardiac Enzymes:  Recent Labs Lab 12/26/14 2007 12/27/14 0227 12/27/14 0620   TROPONINI 0.34* 0.26* 0.57*    CBG:  Recent Labs Lab 01/01/15 1136 01/01/15 1657 01/01/15 2037 01/02/15 0733 01/02/15 1148  GLUCAP 315* 218* 191* 271* 249*    Recent Results (from the past 240 hour(s))  C difficile quick scan w PCR reflex     Status: None   Collection Time: 12/27/14  6:00 AM  Result Value Ref Range Status   C Diff antigen NEGATIVE NEGATIVE Final   C Diff toxin NEGATIVE NEGATIVE Final   C Diff interpretation Negative for toxigenic C. difficile  Final     Studies:   Recent x-ray studies have been reviewed in detail by the Attending Physician  Scheduled Meds:  Scheduled Meds: . antiseptic oral rinse  7 mL Mouth Rinse QID  . aspirin  81 mg Oral Daily  . atorvastatin  40 mg Oral q1800  . carvedilol  6.25 mg Oral BID WC  . chlorhexidine gluconate  15 mL Mouth Rinse BID  . furosemide  40 mg Oral BID  . heparin subcutaneous  5,000 Units Subcutaneous 3 times per day  . hydrocortisone cream   Topical BID  . insulin aspart  0-20 Units Subcutaneous TID WC  . insulin aspart  0-5 Units Subcutaneous QHS  . insulin aspart  4 Units Subcutaneous TID WC  . insulin glargine  14 Units Subcutaneous BID  . iron polysaccharides  150 mg Oral BID  . isosorbide mononitrate  15 mg Oral Daily  . pantoprazole  40 mg Oral Daily    Time spent on care of this patient: 25 mins   [facility] T , MD   [facility] Office  [phone] Pager - Text Page per Loretha Stapler as per below:  On-Call/Text Page:      [email or web address]      password [email or web address]  If 7PM-7AM, please contact night-coverage [email or web address] Password [email or web address] 01/02/2015, 1:45 PM   LOS: 19 days

## 2015-01-02 NOTE — Progress Notes (Signed)
Pt has papers for FMLA and Short Term Disability located on the front of the chart. Cecille Rubinhompson,Timm Bonenberger V, RN

## 2015-01-02 NOTE — Progress Notes (Signed)
Pt encouraged to walk throughout the shift. Pt states he will walk "Later". Will continue to monitor

## 2015-01-03 LAB — BASIC METABOLIC PANEL
Anion gap: 8 (ref 5–15)
BUN: 10 mg/dL (ref 6–20)
CALCIUM: 8.1 mg/dL — AB (ref 8.9–10.3)
CO2: 30 mmol/L (ref 22–32)
CREATININE: 1.11 mg/dL (ref 0.61–1.24)
Chloride: 97 mmol/L — ABNORMAL LOW (ref 101–111)
GFR calc Af Amer: >60 mL/min (ref >60)
Glucose, Bld: 226 mg/dL — ABNORMAL HIGH (ref 65–99)
POTASSIUM: 3.1 mmol/L — AB (ref 3.5–5.1)
SODIUM: 135 mmol/L (ref 135–145)

## 2015-01-03 LAB — CBC
HCT: 25.2 % — ABNORMAL LOW (ref 39.0–52.0)
Hemoglobin: 8.3 g/dL — ABNORMAL LOW (ref 13.0–17.0)
MCH: 28.5 pg (ref 26.0–34.0)
MCHC: 32.9 g/dL (ref 30.0–36.0)
MCV: 86.6 fL (ref 78.0–100.0)
PLATELETS: 211 10*3/uL (ref 150–400)
RBC: 2.91 MIL/uL — ABNORMAL LOW (ref 4.22–5.81)
RDW: 13.3 % (ref 11.5–15.5)
WBC: 5.4 10*3/uL (ref 4.0–10.5)

## 2015-01-03 LAB — GLUCOSE, CAPILLARY
GLUCOSE-CAPILLARY: 241 mg/dL — AB (ref 65–99)
GLUCOSE-CAPILLARY: 234 mg/dL — AB (ref 65–99)
Glucose-Capillary: 189 mg/dL — ABNORMAL HIGH (ref 65–99)
Glucose-Capillary: 152 mg/dL — ABNORMAL HIGH (ref 65–99)

## 2015-01-03 MED ORDER — SENNOSIDES-DOCUSATE SODIUM 8.6-50 MG PO TABS
1.0000 | ORAL_TABLET | Freq: Two times a day (BID) | ORAL | Status: DC
  Administered 2015-01-03 – 2015-01-04 (×2): 1 via ORAL
  Filled 2015-01-03 (×2): qty 1

## 2015-01-03 MED ORDER — POTASSIUM CHLORIDE CRYS ER 20 MEQ PO TBCR
40.0000 meq | EXTENDED_RELEASE_TABLET | Freq: Three times a day (TID) | ORAL | Status: AC
  Administered 2015-01-03 (×3): 40 meq via ORAL
  Filled 2015-01-03 (×3): qty 2

## 2015-01-03 MED ORDER — POLYETHYLENE GLYCOL 3350 17 G PO PACK
17.0000 g | PACK | Freq: Every day | ORAL | Status: DC
  Administered 2015-01-03: 17 g via ORAL
  Filled 2015-01-03: qty 1

## 2015-01-03 MED ORDER — INSULIN ASPART 100 UNIT/ML ~~LOC~~ SOLN
8.0000 [IU] | Freq: Three times a day (TID) | SUBCUTANEOUS | Status: DC
  Administered 2015-01-03 – 2015-01-04 (×2): 8 [IU] via SUBCUTANEOUS

## 2015-01-03 MED ORDER — INSULIN ASPART 100 UNIT/ML ~~LOC~~ SOLN
6.0000 [IU] | Freq: Three times a day (TID) | SUBCUTANEOUS | Status: DC
  Administered 2015-01-03: 6 [IU] via SUBCUTANEOUS

## 2015-01-03 MED ORDER — MAGNESIUM CITRATE PO SOLN
0.5000 | Freq: Once | ORAL | Status: AC | PRN
  Administered 2015-01-03: 0.5 via ORAL
  Filled 2015-01-03: qty 296

## 2015-01-03 MED ORDER — POLYETHYLENE GLYCOL 3350 17 G PO PACK
17.0000 g | PACK | Freq: Two times a day (BID) | ORAL | Status: DC
  Filled 2015-01-03: qty 1

## 2015-01-03 NOTE — Progress Notes (Signed)
[place] TEAM 1 - Stepdown/ICU TEAM PROGRESS NOTE  Brent Rasmussen ZOX:[id number] DOB: 1960-11-21 DOA: 12/14/2014 PCP: No primary care provider on file.  Admit HPI / Brief Narrative: 54 y.o. M Hx DM2, HLD, CAD, Asthma, and Bell's Palsy who presented to [facility]. He was found to have pancreatitis and was in DKA. He was provided 3.5 L bolus. He was started on an insulin drip and [facility]  Significant Events: 11/22 -[facility] 11/22 MRI brain: No acute processes 11/22 CT abdomen: Moderate to severe acute pancreatitis without fluid collection, abscess or pseudocyst. 11/23- with bowel movement became vaso-vagal, passed out, and reportedly had no pulse, cpr 2 min , ETT placed, on presors 11/24 early AM - intubated  11/25- extubated, escallating abdo pain 11/25 repeat CT: Worsening diffuse pancreatitis, no abscess, no cysts 11/26- resp failure, re intubated 11/26- concern NSTEMI, heparin started 11/28 TTE: EF 30-35% w/ severe hypokinesis & anterior wall motion abnormality 11/29 KUB: DHT tip in distal duodenum 11/30- extubated 12/01- radial arterial line removed 12/02 KUB: DHT w/ tip in distal duodenum or transverse segment 12/02- pulled out Dobbhoff Tube had to have it replaced 12/8 - cardiac cath - extensive multifocal CAD but not evidence of acute lesions - no intervention   HPI/Subjective: The pt is c/o severe constipation today.  He denies cp, sob, abdom pain, nausea, or vomiting.   Assessment/Plan:  Severe pancreatitis / Shock - resolved  -Shock resolved -No clear etiology for pancreatitis - pancreatitis is listed as a possible complication of both Invokana and Victoza treatment - the patient was on both of these medications prior to his admission - we will not resume these medications at the time of his discharge -Clinically much improved/resolved - advanced to regular diet w/o difficulty   DM2  -A1c 10.9  - CBG slowly improving but not yet at goal - would like to see consistently 200 or < prior to d/c home - counseled pt on need to keep strict journal of his CBGs at home and possibility of decreasing insulin needs as pancreas improves further w/ time   Acute hypoxic respiratory failure - resolved -Multifactorial to include severe shock, NSTEMI, fluid overload and acute systolic CHF -resolved - off supplemental oxygen  NSTEMI / demand ischemia  -Cardiology has evaluated - multivessel stenoses were noted with collaterals per cath 12/8 - outpt stress testing planned - added long acting nitrate - BP marginal but acceptable for now   Acute on ?chronic systolic CHF - anasarca - acute pulmonary edema -Ejection fraction 30-35%, but improved to 40% at time of cath -hold attempts to diurese further in setting of hypotension - no clinical evidence of volume overload at this time  Acute renal failure -Creatinine improved w/ holding of diuretic - recheck in AM   Hypernatremia -Resolved   Hypokalemia -due to diuretic - supplement and follow   Toxic metabolic encephalopathy -Resolved  Diarrhea  -C diff negative - appears to have been related to tube feeding  Constipation  -due to lack of mobility + narcotic use - stimulate bowels - will need to assure pt has a large bowel movement prior to d/c home   Obesity - Body mass index is 34.05 kg/(m^2).  Code Status: FULL Family Communication: no family present at time of exam today  Disposition Plan: Possible discharge home 12/13 - PT last suggested SNF but pt has refused and will be going home w/ his wife -  needs to have a BM, and CBG needs to be improved prior to clearing for d/c home   Consultants: [facility] Cardiology  PCCM  Antibiotics: None currently   DVT prophylaxis: SQ heparin   Objective: Blood pressure 106/57, pulse 88, temperature 97.9 F (36.6 C), temperature source Axillary, resp. rate 18, height  (1.778 m), weight 107.639  kg (237 lb 4.8 oz), SpO2 96 %.  Intake/Output Summary (Last 24 hours) at 01/03/15 0954 Last data filed at 01/03/15 0802  Gross per 24 hour  Intake   1840 ml  Output   2050 ml  Net   -210 ml   Exam: General: No acute respiratory distress - alert and conversant  Lungs: Clear to auscultation bilaterally - no wheeze  Cardiovascular: RRR  Abdomen: Nontender, obese, soft, bowel sounds positive, no rebound, no ascites, no appreciable mass Extremities: No significant cyanosis, or clubbing; no edema bilateral lower extremities   Data Reviewed: Basic Metabolic Panel:  Recent Labs Lab 12/28/14 0534 12/29/14 0241 12/30/14 0625 12/31/14 0422 01/01/15 0335 01/02/15 0323 01/03/15 0414  NA 143 142 138 138 135 135 135  K 3.0* 3.6 3.7 3.6 3.6 3.1* 3.1*  CL 113* 112* 106 105 101 98* 97*  CO2 GLUCOSE 240* 195* 228* 286* 272* 260* 226*  BUN 25* 21* CREATININE 1.39* 1.30* 1.22 1.37* 1.25* 1.28* 1.11  CALCIUM 7.4* 7.8* 8.0* 8.0* 8.0* 7.9* 8.1*  MG 1.7 1.9  --   --   --   --   --     CBC:  Recent Labs Lab 12/28/14 0534 12/29/14 0241 12/30/14 0625 12/31/14 0422 01/01/15 0335 01/02/15 0323 01/03/15 0414  WBC 7.4 7.4 6.8 6.4 6.0 6.3 5.4  NEUTROABS 6.2 6.1  --   --   --   --   --   HGB 8.7* 8.7* 8.5* 8.7* 8.2* 8.3* 8.3*  HCT 26.5* 26.2* 26.4* 25.7* 25.2* 24.4* 25.2*  MCV 89.5 88.5 88.3 87.4 86.9 85.9 86.6  PLT 129* 155 164 164 186 185 211    Liver Function Tests:  Recent Labs Lab 12/28/14 0534 12/29/14 0241 01/01/15 0335 01/02/15 0323  AST 31 33 20 23  ALT 35 37 28 24  ALKPHOS 59 63 59 58  BILITOT 0.5 0.3 0.7 0.7  PROT 5.1* 5.4* 5.1* 5.2*  ALBUMIN 1.6* 1.6* 1.6* 1.6*    Recent Labs Lab 01/02/15 0323  LIPASE 20   CBG:  Recent Labs Lab 01/02/15 0733 01/02/15 1148 01/02/15 1709 01/02/15 2113 01/03/15 0803  GLUCAP 271* 249* 238* 212* 241*    Recent Results (from the past 240 hour(s))  C difficile quick scan w PCR reflex      Status: None   Collection Time: 12/27/14  6:00 AM  Result Value Ref Range Status   C Diff antigen NEGATIVE NEGATIVE Final   C Diff toxin NEGATIVE NEGATIVE Final   C Diff interpretation Negative for toxigenic C. difficile  Final     Studies:   Recent x-ray studies have been reviewed in detail by the Attending Physician  Scheduled Meds:  Scheduled Meds: . aspirin  81 mg Oral Daily  . atorvastatin  40 mg Oral q1800  . carvedilol  6.25 mg Oral BID WC  . heparin subcutaneous  5,000 Units Subcutaneous 3 times per day  . hydrocortisone cream   Topical BID  . insulin aspart  0-20 Units Subcutaneous TID WC  . insulin aspart  0-5  Units Subcutaneous QHS  . insulin aspart  6 Units Subcutaneous TID WC  . insulin glargine  22 Units Subcutaneous BID  . isosorbide mononitrate  15 mg Oral Daily  . pantoprazole  40 mg Oral Daily    Time spent on care of this patient: 25 mins   [facility] T , MD   [facility] Office  [phone] Pager - Text Page per Loretha Stapler as per below:  On-Call/Text Page:      [email or web address]      password [email or web address]  If 7PM-7AM, please contact night-coverage [email or web address] Password [email or web address] 01/03/2015, 9:54 AM   LOS: 20 days

## 2015-01-03 NOTE — Plan of Care (Signed)
Problem: Skin Integrity: Goal: Risk for impaired skin integrity will decrease Outcome: Progressing Skin care products provided to patient, Red bottom. Encouraged pt application of cream on bottome, also encouraged pt activity.

## 2015-01-03 NOTE — Progress Notes (Signed)
PT Cancellation Note  Patient Details Name: Brent Rasmussen MRN: [id number] DOB: 07-14-1960   Cancelled Treatment:    Reason Eval/Treat Not Completed: Patient declined, no reason specified Pt refused to participate in mobility today secondary to "waiting on the doctor and going home." Reports ambulating this AM. Will follow up next available time.   Blake DivineShauna A Arlyne Brandes 01/03/2015, 10:31 AM Mylo RedShauna Vaneza Pickart, PT, DPT [phone]

## 2015-01-04 ENCOUNTER — Telehealth (HOSPITAL_COMMUNITY): Payer: Self-pay | Admitting: *Deleted

## 2015-01-04 LAB — BASIC METABOLIC PANEL
ANION GAP: 8 (ref 5–15)
BUN: 7 mg/dL (ref 6–20)
CALCIUM: 8.3 mg/dL — AB (ref 8.9–10.3)
CHLORIDE: 100 mmol/L — AB (ref 101–111)
CO2: 29 mmol/L (ref 22–32)
CREATININE: 1 mg/dL (ref 0.61–1.24)
GFR calc non Af Amer: >60 mL/min (ref >60)
GLUCOSE: 177 mg/dL — AB (ref 65–99)
Potassium: 4.2 mmol/L (ref 3.5–5.1)
Sodium: 137 mmol/L (ref 135–145)

## 2015-01-04 LAB — GLUCOSE, CAPILLARY: GLUCOSE-CAPILLARY: 166 mg/dL — AB (ref 65–99)

## 2015-01-04 LAB — MAGNESIUM: Magnesium: 1.6 mg/dL — ABNORMAL LOW (ref 1.7–2.4)

## 2015-01-04 MED ORDER — PANTOPRAZOLE SODIUM 40 MG PO TBEC: 40.0000 mg | DELAYED_RELEASE_TABLET | Freq: Every day | ORAL | Status: DC

## 2015-01-04 MED ORDER — POLYETHYLENE GLYCOL 3350 17 G PO PACK: 17.0000 g | PACK | Freq: Two times a day (BID) | ORAL | Status: DC | PRN

## 2015-01-04 MED ORDER — INSULIN ASPART 100 UNIT/ML FLEXPEN: 12.0000 [IU] | PEN_INJECTOR | Freq: Three times a day (TID) | SUBCUTANEOUS | Status: DC

## 2015-01-04 MED ORDER — INSULIN PEN NEEDLE 29G X 12MM MISC: 12.0000 [IU] | Freq: Three times a day (TID) | Status: DC

## 2015-01-04 MED ORDER — INSULIN ASPART 100 UNIT/ML FLEXPEN: 12.0000 [IU] | PEN_INJECTOR | Freq: Three times a day (TID) | SUBCUTANEOUS | Status: AC

## 2015-01-04 MED ORDER — HYDROXYZINE HCL 10 MG PO TABS: 10.0000 mg | ORAL_TABLET | Freq: Three times a day (TID) | ORAL | Status: DC | PRN

## 2015-01-04 MED ORDER — INSULIN GLARGINE 100 UNIT/ML SOLOSTAR PEN: 22.0000 [IU] | PEN_INJECTOR | Freq: Two times a day (BID) | SUBCUTANEOUS | Status: DC

## 2015-01-04 MED ORDER — ISOSORBIDE MONONITRATE ER 30 MG PO TB24: 15.0000 mg | ORAL_TABLET | Freq: Every day | ORAL | Status: DC

## 2015-01-04 MED ORDER — INSULIN ASPART 100 UNIT/ML ~~LOC~~ SOLN: 12.0000 [IU] | Freq: Three times a day (TID) | SUBCUTANEOUS | Status: DC

## 2015-01-04 MED ORDER — CARVEDILOL 6.25 MG PO TABS: 6.2500 mg | ORAL_TABLET | Freq: Two times a day (BID) | ORAL | Status: DC

## 2015-01-04 MED ORDER — ZOLPIDEM TARTRATE 5 MG PO TABS: 5.0000 mg | ORAL_TABLET | Freq: Every evening | ORAL | Status: DC | PRN

## 2015-01-04 MED ORDER — BLOOD GLUCOSE MONITOR KIT: PACK | Status: DC

## 2015-01-04 MED ORDER — INSULIN GLARGINE 100 UNIT/ML SOLOSTAR PEN: 22.0000 [IU] | PEN_INJECTOR | Freq: Two times a day (BID) | SUBCUTANEOUS | Status: AC

## 2015-01-04 MED ORDER — BLOOD GLUCOSE MONITOR KIT: PACK | Status: AC

## 2015-01-04 MED ORDER — POLYVINYL ALCOHOL 1.4 % OP SOLN: 1.0000 [drp] | OPHTHALMIC | Status: AC | PRN

## 2015-01-04 MED ORDER — POLYETHYLENE GLYCOL 3350 17 G PO PACK: 17.0000 g | PACK | Freq: Two times a day (BID) | ORAL | Status: AC | PRN

## 2015-01-04 MED ORDER — PANTOPRAZOLE SODIUM 40 MG PO TBEC: 40.0000 mg | DELAYED_RELEASE_TABLET | Freq: Every day | ORAL | Status: AC

## 2015-01-04 MED ORDER — INSULIN PEN NEEDLE 29G X 12MM MISC: 12.0000 [IU] | Freq: Three times a day (TID) | Status: AC

## 2015-01-04 MED ORDER — SYMBICORT 160-4.5 MCG/ACT IN AERO: 2.0000 | INHALATION_SPRAY | Freq: Two times a day (BID) | RESPIRATORY_TRACT | Status: AC | PRN

## 2015-01-04 MED ORDER — ATORVASTATIN CALCIUM 20 MG PO TABS
40.0000 mg | ORAL_TABLET | Freq: Every day | ORAL | Status: AC
Start: 2015-01-04 — End: ?

## 2015-01-04 MED ORDER — CARVEDILOL 6.25 MG PO TABS: 6.2500 mg | ORAL_TABLET | Freq: Two times a day (BID) | ORAL | Status: AC

## 2015-01-04 MED ORDER — HYDROCORTISONE 0.5 % EX CREA: TOPICAL_CREAM | Freq: Two times a day (BID) | CUTANEOUS | Status: AC

## 2015-01-04 NOTE — Discharge Summary (Signed)
Physician Discharge Summary  VASILIOS OTTAWAY ZOX:[id number] DOB: 06/04/1960 DOA: 12/14/2014  PCP: No primary care provider on file.  Admit date: 12/14/2014 Discharge date: 01/04/2015  Time spent: 40 minutes  Recommendations for Outpatient Follow-up: Severe pancreatitis/Shock: -Shock resolved - Likely secondary to a combination of DKA & hypertriglyceridemia. Completed 7 day course of imipenem.  -Patient discharged on a heart healthy/American diabetic Association diet. Counseled patient that over the holidays he needed to watch the amount of alcohol, fatty foods, spicy foods, sugary foods that he consumed as this would more than likely result in him becoming acutely ill again. Patient appears to have good understanding of disease process. -Patient has follow-up with his PCP in [place] on 01/06/2015  Acute hypoxic respiratory failure: -Multifactorial to include severe shock, NSTEMI, fluid overload and acute systolic CHF -12/4 PCXR; Lt Pleural Effusion, which resolved with diuresis  SOB -Resolved  NSTEMI/demand ischemia:  -Ejection fraction 30-35%. Known history of coronary artery disease. -Strict I&OSince admission; +5 L -Daily weight; on admission 104.3 kg 12/13  weight standing = 107.6 kg  -Coreg 6.25 mg BID -Aspirin 81 mg daily -Lipitor 40 mg daily -Patient will not be discharged on Lasix will allow cardiology to adjust as needed -Stress test scheduled for 01/06/2015 -Patient has follow-up with Dr. Wilburt Finlay cardiology on 01/10/2015  Acute on Chronic systolic CHF -See NSTEMI -12/8S/P cardiac catheterization; multivessel high-grade stenosis; no intervention unknown chronicity see results below -Weigh of self daily A.m. and maintain a logbook of weight  Acute renal failure:  -Resolved   Hypernatremia (peaked at 166):  -Resolved  Hypokalemia -Potassium goal> 4 -PCP and cardiology to monitor  Hypomagnesemia -Magnesium goal> 2 -PCP and  cardiology to monitor  DM Type 2 Uncontrolled -12/3 Hemoglobin A1c= 10.9  -Lantus 22 units BID -NovoLog 12 units prior to meals -Patient instructed to maintain FSBS/food log book. To take to each visit with PCP in order to make adjustments to medication  HLD -Lipid panel not within ADA guidelines; elevated triglycerides  -Continue Lipitor 40 mg daily  Toxic metabolic encephalopathy:  -Resolved    Discharge Diagnoses:  Active Problems:   DKA (diabetic ketoacidoses) (HCC)   Pancreatitis   Acute respiratory failure with hypoxia (HCC)   Pancreatitis, acute   Encounter for intubation   Encounter for orogastric (OG) tube placement   Encounter for nasogastric (NG) tube placement   NSVT (nonsustained ventricular tachycardia) (HCC)   Elevated troponin   Hypokalemia   Acute renal failure (HCC)   Acute systolic CHF (congestive heart failure) (HCC)   Hypernatremia   Pulmonary edema   Toxic metabolic encephalopathy   Demand ischemia (HCC)   NSTEMI (non-ST elevated myocardial infarction) (HCC)   Uncontrolled type 2 diabetes mellitus with complication (HCC)   Acute on chronic systolic CHF (congestive heart failure) (HCC)   Iron deficiency anemia   Discharge Condition: Stable  Diet recommendation: Heart healthy/American diabetic Association  Filed Weights   01/02/15 0429 01/03/15 0449 01/04/15 0551  Weight: 109.045 kg (240 lb 6.4 oz) 107.639 kg (237 lb 4.8 oz) 107.684 kg (237 lb 6.4 oz)    History of present illness:  Brent Rasmussen is a 54 y.o. WM PMHx Diabetes Type 2, HLD, CAD Native Artery, Asthma, Bell's Palsy   Presented to [facility] for abdominal pain. He was found to have pancreatitis and be in DKA. There he was provided 3.5 L bolus. He was started on an insulin drip and was transferred to [facility] for care. During his hospitalization patient  was treated for multitude of problems which initially started with severe pancreatitis/shock resulting in metabolic  encephalopathy. In addition patient suffered NSTEMI. Patient was treated for his severe pancreatitis initially with NPO, fluids and correction of electrolyte abnormalities. NOTE; patient diabetic with a hemoglobin A1c of 10.9. When stable patient received a left heart catheterization which showed multiple vessels with stenosis which secondary to unknown chronicity was decided to be treated medically by cardiology. Patient's stay was complicated by acute renal failure which has resolved.     Consultants: Dr.Henry Malissa HippoW Smith cardiology Dr.Jennings Harlon [facility]   Procedure/Significant Events: 11/22 MRI brain: No acute processes 11/22 CT abdomen: Moderate to severe acute pancreatitis without fluid collection, abscess or pseudocyst. 11/25 repeat CT: Worsening diffuse pancreatitis, no abscess, no cysts 11/28 TTE: EF 30-35% w/ severe hypokinesis & anterior wall motion abnormality. 11/29 KUB: DHT tip in distal duodenum 12/02 KUB: DHT w/ tip in distal duodenum or transverse segment  SIGNIFICANT EVENTS: 11/22 -[facility] 11/23- BM vasal vagals, cpr 2 min , ETT placed, on presors 11/24- aline placed, major improved pressors, volume given 11/25- extubated, escallating abdo pain 11/26- resp failure, re intubated 11/26- concern NSTEMI, heparin started 11/27- neg 6.4 liters 11/30- extubated 12/01- radial arterial line removed 12/02- pulled out Dobbhoff Tube had to have it replaced 12/8 left heart cath;-Ost Ramus to Ramus lesion, 95% stenosed. -Prox LAD lesion, 100% stenosed. Tx previously with a stent (unknown type). -Ost 1st Mrg to 1st Mrg lesion, 90% stenosed. -Mid Cx to Dist Cx lesion, 60% stenosed. -Mid RCA to Dist RCA lesion, 100% stenosed. -LVEF= 45-50% by visual estimate. Moderate low anteroapical hypokinesia  Culture BCx2 11/22: Negative   Antibiotics: Imipenem 11/24 - 11/30   Discharge Exam: Filed Vitals:   01/03/15 1301 01/03/15 1631  01/03/15 2037 01/04/15 0551  BP: 99/54 115/69 107/55 110/69  Pulse: 90  87   Temp: 98.6 F (37 C)  98.4 F (36.9 C) 97.6 F (36.4 C)  TempSrc: Oral  Oral Oral  Resp: 16  18 19   Height:      Weight:    107.684 kg (237 lb 6.4 oz)  SpO2: 99%  95% 96%    General: A/O 4, sitting in chair comfortably, negative acute respiratory distress  Eyes: Negative headache, eye pain, double vision,negative scleral hemorrhage ENT: Negative Runny nose, negative ear pain, negative gingival bleeding, Neck: Negative scars, masses, torticollis, lymphadenopathy, JVD Lungs: clear to auscultation bilateral, Negative wheezes or crackles Cardiovascular: Regular rate and rhythm without murmur gallop or rub normal S1 and S2 Abdomen: Negative abdominal pain, distended, positive soft, bowel sounds, no rebound, no ascites, no appreciable mass Extremities: No significant cyanosis, clubbing. Bilateral edema to waist 1-2+ pitting, to knee    Discharge Instructions     Medication List    ASK your doctor about these medications        atorvastatin 20 MG tablet  Commonly known as:  LIPITOR  Take 20 mg by mouth daily.     CIPRO 500 MG tablet  Generic drug:  ciprofloxacin  Take 500 mg by mouth 2 (two) times daily.  Ask about: Should I take this medication?     fenofibrate 145 MG tablet  Commonly known as:  TRICOR  Take 145 mg by mouth daily.     gabapentin 300 MG capsule  Commonly known as:  NEURONTIN  Take 300 mg by mouth every evening.     INVOKANA 300 MG Tabs tablet  Generic  drug:  canagliflozin  Take 300 mg by mouth daily.     levocetirizine 5 MG tablet  Commonly known as:  XYZAL  Take 5 mg by mouth at bedtime as needed for allergies.     losartan 50 MG tablet  Commonly known as:  COZAAR  Take 50 mg by mouth daily.     metFORMIN 1000 MG tablet  Commonly known as:  GLUCOPHAGE  Take 1,000 mg by mouth 2 (two) times daily.     metoprolol succinate 100 MG 24 hr tablet  Commonly known as:   TOPROL-XL  Take 100 mg by mouth daily.     PAZEO 0.7 % Soln  Generic drug:  Olopatadine HCl  Apply 1 drop to eye as needed (irritation).     SYMBICORT 160-4.5 MCG/ACT inhaler  Generic drug:  budesonide-formoterol  Inhale 2 puffs into the lungs 2 (two) times daily as needed (shortness of breath).     VASCEPA 1 G Caps  Generic drug:  Icosapent Ethyl  Take 2 g by mouth 2 (two) times daily.     VICTOZA 18 MG/3ML Sopn  Generic drug:  Liraglutide  ADM 1.8 MG  D       Allergies  Allergen Reactions  . Sulfa Antibiotics Other (See Comments)    Asthma attack       Follow-up Information    Follow up with [facility] On 01/06/2015.   Specialty:  Cardiology   Why:  Appointment for your Stress Test on 01/06/2015 at 7:30AM. Nothing to eat or drink after midnight leading up to your test except sips of water with medication.   Contact information:   [address] [place] [address] [phone]      Follow up with Wilburt Finlay, PA-C On 01/10/2015.   Specialties:  Physician Assistant, Radiology, Interventional Cardiology   Why:  Cardiology Hospital Follow-up on 01/10/2015 at 11:30AM   Contact information:   [address] [place] [address] [phone]       Follow up with [facility].   Why:  home health physical therapy and nurse   Contact information:   [address] [place] [address] [phone]        The results of significant diagnostics from this hospitalization (including imaging, microbiology, ancillary and laboratory) are listed below for reference.    Significant Diagnostic Studies: Ct Abdomen Pelvis Wo Contrast  12/18/2014  CLINICAL DATA:  Acute onset of generalized abdominal pain and distention. Respiratory stress. Initial encounter. EXAM: CT ABDOMEN AND PELVIS WITHOUT CONTRAST TECHNIQUE: Multidetector CT imaging of the abdomen and pelvis was performed following the  standard protocol without IV contrast. COMPARISON:  CT of the abdomen and pelvis performed 12/14/2014 FINDINGS: Trace right and small left pleural effusions are noted, with associated atelectasis. Scattered coronary artery calcifications are seen. New small volume ascites is seen within the abdomen and pelvis. There is diffusely worsened soft tissue edema and inflammation noted about the entirety of the pancreas. Evaluation for devascularization is limited without contrast. No pseudocyst formation is characterized at this time. Associated free fluid tracks about the adjacent stomach, and extends along Gerota's fascia bilaterally. The liver and spleen are unremarkable in appearance. The gallbladder is within normal limits. The pancreas and adrenal glands are unremarkable. Mild nonspecific perinephric stranding is noted bilaterally. The kidneys are otherwise unremarkable. There is no evidence of hydronephrosis. No renal or ureteral stones are identified. No free fluid is identified. The small bowel is  unremarkable in appearance. The stomach is within normal limits. No acute vascular abnormalities are seen. The patient's enteric tube is seen ending at the body of the stomach. The appendix is not definitely characterized; there is no evidence of appendicitis. The colon is largely decompressed and is unremarkable in appearance. The bladder is decompressed, with a Foley catheter in place. The prostate remains normal in size. No inguinal lymphadenopathy is seen. No acute osseous abnormalities are identified. IMPRESSION: 1. Worsening acute pancreatitis noted, with worsened soft tissue edema and inflammation noted about the entirety of the pancreas. Evaluation for devascularization is limited without contrast. No evidence of pseudocyst formation at this time. Associated free fluid tracks about the adjacent stomach, and extends along Gerota's fascia bilaterally. 2. New small volume ascites within the abdomen and pelvis. 3.  Trace right and small left pleural effusions, new from the prior study, with associated atelectasis. 4. Scattered coronary artery calcifications seen. Electronically Signed   By: Roanna Raider M.D.   On: 12/18/2014 01:58   Ct Abdomen Pelvis Wo Contrast  12/14/2014  CLINICAL DATA:  Altered mental status, reason UTI, hypothermia, diabetes, elevated lactate EXAM: CT ABDOMEN AND PELVIS WITHOUT CONTRAST TECHNIQUE: Multidetector CT imaging of the abdomen and pelvis was performed following the standard protocol without IV contrast. COMPARISON:  None. FINDINGS: Lower chest:  No acute findings. Hepatobiliary: No mass visualized on this un-enhanced exam. Pancreas: Diffuse peripancreatic stranding edema evident which also extends along the retroperitoneum, duodenum, and transverse mesocolon. Findings are compatible with moderate to severe acute pancreatitis. No associated fluid collection, pseudocyst or abscess. No ductal dilatation. Spleen: Within normal limits in size. Adrenals/Urinary Tract: Normal adrenal glands. Minor nonspecific perinephric strandy edema without obstructing urinary tract or ureteral calculus. Stomach/Bowel: Slight thickening of the duodenum adjacent to the pancreas, suspect mild secondary duodenitis. No associated obstruction, ileus, or free air. Vascular/Lymphatic: No adenopathy. Negative for aneurysm. Minor atherosclerosis of the bifurcation and iliac vessels. Reproductive: No mass or other significant abnormality. Other: No inguinal abnormality all are hernia. Musculoskeletal:  Minor degenerative changes of the lumbar spine. IMPRESSION: Moderate to severe acute pancreatitis without fluid collection, abscess or pseudocyst. Mild wall thickening of the adjacent duodenum, suspect reactive duodenitis secondary to the adjacent inflammatory process. No associated bowel obstruction or free air Electronically Signed   By: Judie Petit.  Shick M.D.   On: 12/14/2014 19:36   Dg Abd 1 View  12/22/2014  CLINICAL  DATA:  Feeding tube placement EXAM: ABDOMEN - 1 VIEW COMPARISON:  12/21/2014 FINDINGS: Feeding tube tip is in the distal duodenum as before. Mild gas distention of small bowel with air present in the colon distally. Monitor leads overlie the chest. No acute osseous finding or abnormal calcification. IMPRESSION: Feeding tube tip distal duodenum. Electronically Signed   By: Judie Petit.  Shick M.D.   On: 12/22/2014 17:27   Ct Head Wo Contrast  12/14/2014  CLINICAL DATA:  Patient with altered mental status. Recent diagnosis urinary tract infection. EXAM: CT HEAD WITHOUT CONTRAST TECHNIQUE: Contiguous axial images were obtained from the base of the skull through the vertex without intravenous contrast. COMPARISON:  MRI brain 01/25/2009. FINDINGS: Ventricles and sulci are appropriate for patient's age. No evidence for acute cortically based infarct, intracranial hemorrhage, mass lesion mass-effect. Orbits are unremarkable. Paranasal sinuses are well aerated. Mastoid air cells are unremarkable. Calvarium is intact. IMPRESSION: No acute intracranial process. Electronically Signed   By: Annia Belt M.D.   On: 12/14/2014 19:30   Dg Chest Port 1 View  12/26/2014  CLINICAL  DATA:  Pulmonary edema.  Respiratory failure. EXAM: PORTABLE CHEST 1 VIEW COMPARISON:  12/18/2014 and 12/20/2014. FINDINGS: 0534 hours. Interval extubation and removal of the nasogastric tube. Feeding tube projects below the diaphragm, tip not visualized. Left subclavian line appears unchanged at the lower SVC level. The heart size and mediastinal contours are stable. There are persistent low lung volumes with left-greater-than-right basilar airspace opacities and a possible left pleural effusion. No pneumothorax. IMPRESSION: No significant change in bibasilar airspace opacities following extubation. Remaining support system appears adequately positioned. Electronically Signed   By: Carey Bullocks M.D.   On: 12/26/2014 07:58   Dg Chest Port 1  View  12/20/2014  CLINICAL DATA:  Respiratory failure. EXAM: PORTABLE CHEST 1 VIEW COMPARISON:  12/18/2014 FINDINGS: The endotracheal tube tip is 4.4 cm above the carina. Nasogastric tube extends into the stomach. Left subclavian central line extends into the SVC. No pneumothorax. Mild left base consolidation persists without significant interval change. Small pleural effusions are probably present. IMPRESSION: Support equipment appears satisfactorily positioned. Unchanged left base consolidation and probable small bilateral effusions. Electronically Signed   By: Ellery Plunk M.D.   On: 12/20/2014 03:53   Dg Chest Port 1 View  12/18/2014  CLINICAL DATA:  Feeding tube placement EXAM: PORTABLE CHEST 1 VIEW COMPARISON:  12/18/2014 FINDINGS: Cardiomediastinal silhouette is stable. Persistent left base atelectasis or infiltrate. Endotracheal tube is unchanged in position. NG tube is unchanged in position. There is a second NG feeding tube with tip in mid stomach. No pneumothorax. Left subclavian central line is unchanged in position. IMPRESSION: Persistent left base atelectasis or infiltrate. Endotracheal tube is unchanged in position. NG tube is unchanged in position. There is a second NG feeding tube with tip in mid stomach. No pneumothorax. Left subclavian central line is unchanged in position. Electronically Signed   By: Natasha Mead M.D.   On: 12/18/2014 18:42   Dg Chest Port 1 View  12/18/2014  CLINICAL DATA:  Pancreatitis. EXAM: PORTABLE CHEST 1 VIEW COMPARISON:  12/17/2014 FINDINGS: Nasogastric tube has tip over the stomach in the left upper quadrant. Left subclavian central venous catheter unchanged with tip overlying the SVC. Interval extubation. Lungs are hypoinflated with slight worsening opacification in the left base likely small effusion with atelectasis. Mild stable cardiomegaly. Remainder of the exam is unchanged. IMPRESSION: Hypoinflation with slight worsening opacification left base likely  small effusion with atelectasis. Stable cardiomegaly. Tubes and lines as described. Electronically Signed   By: Elberta Fortis M.D.   On: 12/18/2014 09:34   Dg Chest Port 1 View  12/18/2014  CLINICAL DATA:  Post intubation.  Intentional self poisoning. EXAM: PORTABLE CHEST 1 VIEW COMPARISON:  12/18/2014 and 12/17/2014 FINDINGS: Nasogastric tube courses into the region of the stomach and off the film as the tip is not visualized. Left subclavian central venous catheter with tip overlying the SVC. Tip of endotracheal tube partially obscured by overlying EKG wires as tip appears to be approximately 2 cm above the carina. Lungs are hypoinflated with persistent left base opacification likely atelectasis/effusion. Minimal right perihilar opacification. Mild stable cardiomegaly. Remainder of the exam is unchanged. IMPRESSION: Hypoinflation. Mild left basilar opacification likely atelectasis and small effusion. Mild right perihilar opacification likely atelectasis. Tubes and lines as described. Electronically Signed   By: Elberta Fortis M.D.   On: 12/18/2014 09:19   Portable Chest Xray  12/17/2014  CLINICAL DATA:  Shortness of breath. EXAM: PORTABLE CHEST 1 VIEW COMPARISON:  12/16/2014. FINDINGS: Endotracheal tube and NG tube in stable  position. Mediastinum and hilar structures are normal. Stable cardiomegaly. Bibasilar subsegmental atelectasis again noted. Developing infiltrate left lower lobe cannot be excluded. No pleural effusion or pneumothorax. IMPRESSION: 1. Lines and tubes in stable position. 2. Persistent bibasilar subsegmental atelectasis. Developing left lower lobe infiltrate cannot be excluded . 3. Stable cardiomegaly . Electronically Signed   By: Maisie Fus  Register   On: 12/17/2014 07:38   Dg Chest Port 1 View  12/16/2014  CLINICAL DATA:  Pancreatitis EXAM: PORTABLE CHEST 1 VIEW COMPARISON:  Chest x-ray from earlier same day. FINDINGS: Endotracheal tube remains well positioned with tip just above the  level of the carina. Left subclavian central line has been placed in the interval with tip adequately positioned in the SVC. Enteric tube passes below the diaphragm. Pacer pad again noted over the mediastinum. Overall cardiomediastinal silhouette is stable in size and configuration. Mild atelectasis versus scarring within each lung, unchanged. No new lung findings. No pleural effusion seen. No pneumothorax seen. IMPRESSION: 1. Interval left-sided central line placement with tip well positioned in the SVC. 2. Otherwise stable chest x-ray. No new lung findings. No pneumothorax. Electronically Signed   By: Bary Richard M.D.   On: 12/16/2014 07:20   Portable Chest Xray  12/16/2014  CLINICAL DATA:  Endotracheal tube placement.  Initial encounter. EXAM: PORTABLE CHEST 1 VIEW COMPARISON:  Chest radiograph from 12/14/2014 FINDINGS: The patient's endotracheal tube is seen ending 2 cm above the carina. An enteric tube is noted extending below the diaphragm. The lungs are significantly hypoexpanded. Mild bibasilar atelectasis is noted. No pleural effusion or pneumothorax is seen. The cardiomediastinal silhouette is normal in size. No acute osseous abnormalities are seen. External pacing pads are noted. IMPRESSION: 1. Endotracheal tube seen ending 2 cm above the carina. 2. Lungs significantly hypoexpanded, with mild bibasilar atelectasis. Electronically Signed   By: Roanna Raider M.D.   On: 12/16/2014 01:15   Dg Chest Port 1 View  12/14/2014  CLINICAL DATA:  Altered Mental status, Elevated Blood/sugar/ Fever. HX: diabetes, CAD, Asthma, Cardiac Catherization EXAM: PORTABLE CHEST - 1 VIEW COMPARISON:  01/08/2010 FINDINGS: Low lung volumes. No focal infiltrate or overt edema. Heart size normal. No pneumothorax. No effusion. Visualized skeletal structures are unremarkable. IMPRESSION: Low lung volumes.  No acute cardiopulmonary disease. Electronically Signed   By: Corlis Leak M.D.   On: 12/14/2014 19:31   Dg Abd Portable  1v  12/24/2014  CLINICAL DATA:  Feeding tube placement EXAM: PORTABLE ABDOMEN - 1 VIEW COMPARISON:  Plain film of the abdomen dated 12/22/2014. FINDINGS: The tip of the feeding tube now appears to be in the descending duodenum or proximal transverse segment, slightly retracted in position compared to the earlier exam. IMPRESSION: IMPRESSION Feeding tube adequately positioned with tip in the distal portion of the descending duodenum or proximal transverse segment, previously within the distal duodenum. Electronically Signed   By: Bary Richard M.D.   On: 12/24/2014 14:32   Dg Abd Portable 1v  12/21/2014  CLINICAL DATA:  Orogastric tube removal.  Feeding tube adjustment. EXAM: PORTABLE ABDOMEN - 1 VIEW COMPARISON:  Yesterday FINDINGS: Feeding tube tip is at the level of the fourth portion duodenum. The nasogastric tube is been removed. No dilated bowel in the visualized abdomen. Bibasilar lung opacity, better evaluated on chest x-ray from yesterday. IMPRESSION: Feeding tube tip at the distal duodenum. Electronically Signed   By: Marnee Spring M.D.   On: 12/21/2014 13:59   Dg Abd Portable 1v  12/20/2014  CLINICAL DATA:  Nasogastric  tube and feeding tube. EXAM: PORTABLE ABDOMEN - 1 VIEW COMPARISON:  12/18/2014. FINDINGS: Nasogastric tube terminates in the body of the stomach. Feeding tube tip projects near the expected location of the ligament of Treitz. Location of the ligament of Treitz. IMPRESSION: Post pyloric feeding tube, near the ligament of Treitz. Electronically Signed   By: Leanna Battles M.D.   On: 12/20/2014 14:27   Dg Abd Portable 1v  12/18/2014  CLINICAL DATA:  Enteric tube placed EXAM: PORTABLE ABDOMEN - 1 VIEW COMPARISON:  Abdominal radiograph 1 day prior FINDINGS: Enteric tube terminates in the proximal stomach with the side port in the very proximal stomach. No dilated small bowel loops or appreciable colonic stool. No evidence of pneumatosis or pneumoperitoneum. IMPRESSION: Enteric  tube terminates in the proximal stomach. Electronically Signed   By: Delbert Phenix M.D.   On: 12/18/2014 10:22   Dg Abd Portable 1v  12/17/2014  CLINICAL DATA:  NG tube placement. EXAM: PORTABLE ABDOMEN - 1 VIEW COMPARISON:  Chest x-ray earlier today. FINDINGS: There has been placement of a nasogastric tube with tip and side-port over the region of the stomach in the left upper quadrant. Bowel gas pattern is nonobstructive. Minimal degenerative change of the spine. IMPRESSION: No acute findings. Nasogastric tube with tip and side-port over the stomach in the left upper quadrant. Electronically Signed   By: Elberta Fortis M.D.   On: 12/17/2014 16:45    Microbiology: Recent Results (from the past 240 hour(s))  C difficile quick scan w PCR reflex     Status: None   Collection Time: 12/27/14  6:00 AM  Result Value Ref Range Status   C Diff antigen NEGATIVE NEGATIVE Final   C Diff toxin NEGATIVE NEGATIVE Final   C Diff interpretation Negative for toxigenic C. difficile  Final     Labs: Basic Metabolic Panel:  Recent Labs Lab 12/29/14 0241  12/31/14 0422 01/01/15 0335 01/02/15 0323 01/03/15 0414 01/04/15 0612  NA 142  < > 138 135 135 135 137  K 3.6  < > 3.6 3.6 3.1* 3.1* 4.2  CL 112*  < > 105 101 98* 97* 100*  CO2 25  < > 22 25 26 30 29   GLUCOSE 195*  < > 286* 272* 260* 226* 177*  BUN 21*  < > 17 15 13 10 7   CREATININE 1.30*  < > 1.37* 1.25* 1.28* 1.11 1.00  CALCIUM 7.8*  < > 8.0* 8.0* 7.9* 8.1* 8.3*  MG 1.9  --   --   --   --   --  1.6*  < > = values in this interval not displayed. Liver Function Tests:  Recent Labs Lab 12/29/14 0241 01/01/15 0335 01/02/15 0323  AST 33 20 23  ALT 37 28 24  ALKPHOS 63 59 58  BILITOT 0.3 0.7 0.7  PROT 5.4* 5.1* 5.2*  ALBUMIN 1.6* 1.6* 1.6*    Recent Labs Lab 01/02/15 0323  LIPASE 20   No results for input(s): AMMONIA in the last 168 hours. CBC:  Recent Labs Lab 12/29/14 0241 12/30/14 0625 12/31/14 0422 01/01/15 0335  01/02/15 0323 01/03/15 0414  WBC 7.4 6.8 6.4 6.0 6.3 5.4  NEUTROABS 6.1  --   --   --   --   --   HGB 8.7* 8.5* 8.7* 8.2* 8.3* 8.3*  HCT 26.2* 26.4* 25.7* 25.2* 24.4* 25.2*  MCV 88.5 88.3 87.4 86.9 85.9 86.6  PLT 155 164 164 186 185 211   Cardiac Enzymes: No results  for input(s): CKTOTAL, CKMB, CKMBINDEX, TROPONINI in the last 168 hours. BNP: BNP (last 3 results)  Recent Labs  12/25/14 1230  BNP 404.0*    ProBNP (last 3 results) No results for input(s): PROBNP in the last 8760 hours.  CBG:  Recent Labs Lab 01/03/15 0803 01/03/15 1144 01/03/15 1624 01/03/15 2058 01/04/15 0729  GLUCAP 241* 234* 189* 152* 166*       Signed:  Carolyne Littles, MD [facility] [phone] pager

## 2015-01-04 NOTE — Progress Notes (Signed)
Pt discharge teaching and instructions reviewed. No further questions. VSS. Pt discharging home with wife

## 2015-01-04 NOTE — Telephone Encounter (Signed)
Left message on voicemail in reference to upcoming appointment scheduled for 01/06/15. Phone number given for a call back so details instructions can be given. Brent Rasmussen W   

## 2015-01-05 ENCOUNTER — Telehealth (HOSPITAL_COMMUNITY): Payer: Self-pay | Admitting: *Deleted

## 2015-01-05 NOTE — Telephone Encounter (Signed)
Left message on voicemail in reference to upcoming appointment scheduled for 01/06/15. Phone number given for a call back so details instructions can be given. Daneil DolinSharon S Brooks

## 2015-01-06 ENCOUNTER — Ambulatory Visit (HOSPITAL_COMMUNITY): Payer: BLUE CROSS/BLUE SHIELD

## 2015-01-06 ENCOUNTER — Telehealth (HOSPITAL_COMMUNITY): Payer: Self-pay | Admitting: *Deleted

## 2015-01-06 NOTE — Telephone Encounter (Signed)
Patient was scheduled for a MPI today at 0730. Patient was a no show and no Telephone call. Patient attempted to contact,no answer. I left a message for patient to call back if he wants to reschedule.Shakirra Buehler, Adelene IdlerCynthia W

## 2015-01-10 ENCOUNTER — Ambulatory Visit: Payer: BLUE CROSS/BLUE SHIELD | Admitting: Physician Assistant

## 2015-01-11 ENCOUNTER — Telehealth (HOSPITAL_COMMUNITY): Payer: Self-pay | Admitting: *Deleted

## 2015-01-11 NOTE — Telephone Encounter (Signed)
Left message on voicemail in reference to upcoming appointment scheduled for 01/14/15. Phone number given for a call back so details instructions can be given. Brent Rasmussen   

## 2015-01-12 ENCOUNTER — Telehealth (HOSPITAL_COMMUNITY): Payer: Self-pay | Admitting: *Deleted

## 2015-01-12 NOTE — Telephone Encounter (Signed)
Left message on voicemail in reference to upcoming appointment scheduled for 01/14/15. Phone number given for a call back so details instructions can be given. Peggy Loge J Avryl Roehm, RN 

## 2015-01-13 ENCOUNTER — Telehealth (HOSPITAL_COMMUNITY): Payer: Self-pay | Admitting: *Deleted

## 2015-01-13 NOTE — Telephone Encounter (Signed)
Left message on voicemail in reference to upcoming appointment scheduled for 01/14/15. Phone number given for a call back so details instructions can be given. Brent Rasmussen W   

## 2015-01-14 ENCOUNTER — Ambulatory Visit (HOSPITAL_COMMUNITY): Payer: BLUE CROSS/BLUE SHIELD | Attending: Cardiology

## 2015-01-14 DIAGNOSIS — R5383 Other fatigue: Secondary | ICD-10-CM | POA: Diagnosis not present

## 2015-01-14 DIAGNOSIS — R9439 Abnormal result of other cardiovascular function study: Secondary | ICD-10-CM | POA: Insufficient documentation

## 2015-01-14 DIAGNOSIS — R079 Chest pain, unspecified: Secondary | ICD-10-CM | POA: Diagnosis present

## 2015-01-14 DIAGNOSIS — E109 Type 1 diabetes mellitus without complications: Secondary | ICD-10-CM | POA: Diagnosis not present

## 2015-01-14 LAB — MYOCARDIAL PERFUSION IMAGING
CHL CUP NUCLEAR SSS: 26
CHL CUP RESTING HR STRESS: 89 {beats}/min
CSEPPHR: 96 {beats}/min
LV dias vol: 129 mL
LVSYSVOL: 69 mL
NUC STRESS TID: 1.05
RATE: 0.35
SDS: 9
SRS: 17

## 2015-01-14 MED ORDER — TECHNETIUM TC 99M SESTAMIBI GENERIC - CARDIOLITE
10.2000 | Freq: Once | INTRAVENOUS | Status: AC | PRN
  Administered 2015-01-14: 10 via INTRAVENOUS

## 2015-01-14 MED ORDER — TECHNETIUM TC 99M SESTAMIBI GENERIC - CARDIOLITE
33.0000 | Freq: Once | INTRAVENOUS | Status: AC | PRN
  Administered 2015-01-14: 33 via INTRAVENOUS

## 2015-01-14 MED ORDER — REGADENOSON 0.4 MG/5ML IV SOLN
0.4000 mg | Freq: Once | INTRAVENOUS | Status: AC
  Administered 2015-01-14: 0.4 mg via INTRAVENOUS

## 2015-01-18 ENCOUNTER — Encounter: Payer: Self-pay | Admitting: Physical Therapy

## 2015-01-18 ENCOUNTER — Ambulatory Visit: Payer: BLUE CROSS/BLUE SHIELD | Attending: Internal Medicine | Admitting: Physical Therapy

## 2015-01-18 DIAGNOSIS — R29898 Other symptoms and signs involving the musculoskeletal system: Secondary | ICD-10-CM | POA: Insufficient documentation

## 2015-01-18 DIAGNOSIS — R5381 Other malaise: Secondary | ICD-10-CM | POA: Diagnosis not present

## 2015-01-18 NOTE — Therapy (Signed)
[facility] [address] [place], [place], [address] Phone: [phone]   Fax:  [phone]  Physical Therapy Evaluation  Patient Details  Name: Leotis ShamesDaniel J Cragin MRN: [id number] Date of Birth: 1960-02-22 Referring Provider: Shanon Acevashti day  Encounter Date: 01/18/2015      PT End of Session - 01/18/15 1733    Visit Number 1   Date for PT Re-Evaluation 03/21/15   PT Start Time 1704   PT Stop Time 1753   PT Time Calculation (min) 49 min   Activity Tolerance Patient limited by fatigue   Behavior During Therapy [facility] for tasks assessed/performed      Past Medical History  Diagnosis Date  . Diabetes mellitus without complication (HCC)   . Coronary artery disease   . Asthma   . Bell's palsy   . DKA (diabetic ketoacidoses) (HCC)   . Pancreatitis     Past Surgical History  Procedure Laterality Date  . Cardiac catheterization    . Coronary angioplasty with stent placement    . Cardiac catheterization N/A 12/30/2014    Procedure: Left Heart Cath and Coronary Angiography;  Surgeon: Runell GessJonathan J Berry, MD;  [facility];  Service: Cardiovascular;  Laterality: N/A;  . Rotator cuff repair    . Trigger finger release      There were no vitals filed for this visit.  Visit Diagnosis:  Debility - Plan: PT plan of care cert/re-cert  Muscular deconditioning - Plan: PT plan of care cert/re-cert      Subjective Assessment - 01/18/15 1710    Subjective Patient reports that he was admitted to the hospital November 22nd until December 13th, reports that he was admitted due to pancreatitis, he also reports that he had a code and was on a vent for about a week.  He reports that he lost 30#, and reports that he is very week.   Diagnostic tests reports stress test on Friday was "close to normal"   Patient Stated Goals be active   Currently in Pain? No/denies            [facility] PT Assessment - 01/18/15 0001    Assessment   Medical Diagnosis debility/deconditioning   Referring Provider vashti day   Onset Date/Surgical Date 01/04/15   [facility]   Precautions   Precautions None   Restrictions   Other Position/Activity Restrictions Patient with O2 sats at 97%, HR at 100 bpm at rest.     Balance Screen   Has the patient fallen in the past 6 months No   Has the patient had a decrease in activity level because of a fear of falling?  No   Is the patient reluctant to leave their home because of a fear of falling?  No   Home Environment   Additional Comments he does yardwork and housework   Prior Function   Level of Independence Independent   Vocation Full time employment   Vocation Requirements mostly sitting at computer, he is off work now and would like to go back on the 3rd   Leisure would like to work out again   [employer]ure/Postural Control   Posture Comments fwd head, rounded shoulders   AROM   Overall AROM Comments Lumbar ROM WFL's, UE WFL's   Strength   Overall Strength Comments UE's 4-/5, LE's 4-/5   Flexibility   Soft Tissue Assessment /Muscle Length --  some tightness of the HS and  piriformis   Palpation   Palpation comment seems to have some atrophty of all mms   Ambulation/Gait   Gait Comments able to walk without assistive device 200 feet, reports that he has to rest when he goes up the stairs at home   High Level Balance   High Level Balance Comments unable to stand on one foot > 5 seconds                   [facility] Adult PT Treatment/Exercise - 01/18/15 0001    Exercises   Exercises Knee/Hip   Knee/Hip Exercises: Aerobic   Nustep Level 5 x 5 minutes   Other Aerobic UBE constant work 20 watts 4 minutes,   O2 sats to 94% and HR at 115 bpm   Knee/Hip Exercises: Machines for Strengthening   Cybex Knee Extension 10# 2x15   Cybex Knee Flexion 25# 2x15   Other Machine seated row 35#, lats 25# 2x10 each                  PT Short Term  Goals - 01/18/15 1741    PT SHORT TERM GOAL #1   Title have a good undetrstanding of his condition and his ability to exercise on own   Time 1   Period Weeks   Status New           PT Long Term Goals - 01/18/15 1742    PT LONG TERM GOAL #1   Title independent with advanced HEP/gym program   Time 6   Period Weeks   Status New   PT LONG TERM GOAL #2   Title report that he can climb stairs without taking a break   Time 8   Period Weeks   Status New   PT LONG TERM GOAL #3   Title ambulate around our building 2 laps without rest   Time 8   Period Weeks   Status New               Plan - 01/18/15 1736    Clinical Impression Statement Patient with a 3+ week hospital stay iwth a code and had to be intubated.  He has lost 30#, he is very weak and deconditioned.  He would like to return to his job next week.  His job is mostly sitting.  He reports that he has to rest after going up his stairs.  His concern is that his deductible starts over 01/23/15.  He would like to have a good understanding of how to do on own if he can.   Pt will benefit from skilled therapeutic intervention in order to improve on the following deficits Abnormal gait;Cardiopulmonary status limiting activity;Decreased activity tolerance;Decreased balance;Decreased endurance;Decreased mobility;Decreased strength;Difficulty walking   Rehab Potential Good   PT Frequency 3x / week  will decrease frequency after this week to 1-2x/week   PT Duration 8 weeks   PT Treatment/Interventions ADLs/Self Care Home Management;Stair training;Gait training;Functional mobility training;Therapeutic activities;Therapeutic exercise;Balance training;Patient/family education;Energy conservation   PT Next Visit Plan slowly add, continue to monitor O2 saturation and advance as tolerated.   Consulted and Agree with Plan of Care Patient         Problem List Patient Active Problem List   Diagnosis Date Noted  . Iron deficiency  anemia 12/31/2014  . Acute on chronic systolic CHF (congestive heart failure) (HCC)   . Uncontrolled type 2 diabetes mellitus with complication (HCC)   . Demand ischemia (HCC)   . NSTEMI (  non-ST elevated myocardial infarction) (HCC)   . Pulmonary edema   . Toxic metabolic encephalopathy   . Hypernatremia   . Acute systolic CHF (congestive heart failure) (HCC) 12/21/2014  . NSVT (nonsustained ventricular tachycardia) (HCC) 12/20/2014  . Elevated troponin 12/20/2014  . Hypokalemia   . Acute renal failure (HCC)   . Encounter for nasogastric (NG) tube placement   . Acute respiratory failure with hypoxia (HCC)   . Pancreatitis, acute   . Encounter for intubation   . Encounter for orogastric (OG) tube placement   . Pancreatitis   . DKA (diabetic ketoacidoses) (HCC) 12/14/2014    Jearld Lesch., PT 01/18/2015, 5:53 PM  [facility] [address] [address] [place], [place], [address] Phone: [phone]   Fax:  [phone]  Name: ARNETT DUDDY MRN: [id number] Date of Birth: 1961/01/08

## 2015-01-19 ENCOUNTER — Ambulatory Visit: Payer: BLUE CROSS/BLUE SHIELD | Admitting: Physical Therapy

## 2015-01-19 ENCOUNTER — Encounter: Payer: Self-pay | Admitting: Physical Therapy

## 2015-01-19 DIAGNOSIS — R5381 Other malaise: Secondary | ICD-10-CM

## 2015-01-19 DIAGNOSIS — R29898 Other symptoms and signs involving the musculoskeletal system: Secondary | ICD-10-CM

## 2015-01-19 NOTE — Therapy (Signed)
[facility] [address] [place], [place], [address] Phone: [phone]   Fax:  [phone]  Physical Therapy Treatment  Patient Details  Name: Brent Rasmussen MRN: [id number] Date of Birth: Aug 06, 1960 Referring Provider: Shanon Acevashti day  Encounter Date: 01/19/2015      PT End of Session - 01/19/15 1342    Visit Number 2   Date for PT Re-Evaluation 03/21/15   PT Start Time 1300   PT Stop Time 1344   PT Time Calculation (min) 44 min   Activity Tolerance Patient limited by fatigue   Behavior During Therapy [facility] for tasks assessed/performed      Past Medical History  Diagnosis Date  . Diabetes mellitus without complication (HCC)   . Coronary artery disease   . Asthma   . Bell's palsy   . DKA (diabetic ketoacidoses) (HCC)   . Pancreatitis     Past Surgical History  Procedure Laterality Date  . Cardiac catheterization    . Coronary angioplasty with stent placement    . Cardiac catheterization N/A 12/30/2014    Procedure: Left Heart Cath and Coronary Angiography;  Surgeon: Runell GessJonathan J Berry, MD;  [facility];  Service: Cardiovascular;  Laterality: N/A;  . Rotator cuff repair    . Trigger finger release      There were no vitals filed for this visit.  Visit Diagnosis:  Muscular deconditioning  Debility      Subjective Assessment - 01/19/15 1303    Subjective No change since eval. Pt reports that he wasn't tired or sore.   Currently in Pain? Yes   Pain Score 4    Pain Location Back   Pain Orientation Lower                         [facility] Adult PT Treatment/Exercise - 01/19/15 0001    Exercises   Exercises Knee/Hip   Knee/Hip Exercises: Aerobic   Nustep Level 4 x 7 minutes   Other Aerobic UBE constant work 20 watts 4 minutes,   SpO2 96%    Knee/Hip Exercises: Machines for Strengthening   Cybex Leg Press #30 2x15   Other Machine seated row 45#, lats 35# 2x10 each   Knee/Hip Exercises: Standing   Forward Step Up 2 sets;5 reps;Step Height: 8"   Other Standing Knee Exercises Standing straight arm pull downs #35 2x15    Other Standing Knee Exercises standing Rev grip ROW #45 2x15    Knee/Hip Exercises: Seated   Sit to Sand 2 sets;10 reps;without UE support  yellow weighted ball.                  PT Short Term Goals - 01/18/15 1741    PT SHORT TERM GOAL #1   Title have a good undetrstanding of his condition and his ability to exercise on own   Time 1   Period Weeks   Status New           PT Long Term Goals - 01/18/15 1742    PT LONG TERM GOAL #1   Title independent with advanced HEP/gym program   Time 6   Period Weeks   Status New   PT LONG TERM GOAL #2   Title report that he can climb stairs without taking a break   Time 8   Period Weeks   Status New   PT LONG TERM GOAL #3   Title ambulate  around our building 2 laps without rest   Time 8   Period Weeks   Status New               Plan - 01/19/15 1343    Clinical Impression Statement Pt able to progress to gym level interventions without any c/o pain. Pt does have issues transitioning from supine to standing regarding light headedness. No issue with O2 SATs.  Does fatigue easily and require multiple rest breaks.   Pt will benefit from skilled therapeutic intervention in order to improve on the following deficits Abnormal gait;Cardiopulmonary status limiting activity;Decreased activity tolerance;Decreased balance;Decreased endurance;Decreased mobility;Decreased strength;Difficulty walking   Rehab Potential Good   PT Next Visit Plan slowly add, continue to monitor O2 saturation and advance as tolerated.        Problem List Patient Active Problem List   Diagnosis Date Noted  . Iron deficiency anemia 12/31/2014  . Acute on chronic systolic CHF (congestive heart failure) (HCC)   . Uncontrolled type 2 diabetes mellitus with complication (HCC)   . Demand ischemia  (HCC)   . NSTEMI (non-ST elevated myocardial infarction) (HCC)   . Pulmonary edema   . Toxic metabolic encephalopathy   . Hypernatremia   . Acute systolic CHF (congestive heart failure) (HCC) 12/21/2014  . NSVT (nonsustained ventricular tachycardia) (HCC) 12/20/2014  . Elevated troponin 12/20/2014  . Hypokalemia   . Acute renal failure (HCC)   . Encounter for nasogastric (NG) tube placement   . Acute respiratory failure with hypoxia (HCC)   . Pancreatitis, acute   . Encounter for intubation   . Encounter for orogastric (OG) tube placement   . Pancreatitis   . DKA (diabetic ketoacidoses) (HCC) 12/14/2014    Grayce Sessions, PTA  01/19/2015, 1:45 PM  [facility] [place] [address] [place], [place], [address] Phone: [phone]   Fax:  [phone]  Name: Brent Rasmussen MRN: [id number] Date of Birth: [birth year]

## 2015-01-20 ENCOUNTER — Encounter: Payer: Self-pay | Admitting: Physical Therapy

## 2015-01-20 ENCOUNTER — Ambulatory Visit: Payer: BLUE CROSS/BLUE SHIELD | Admitting: Physical Therapy

## 2015-01-20 DIAGNOSIS — R5381 Other malaise: Secondary | ICD-10-CM | POA: Diagnosis not present

## 2015-01-20 DIAGNOSIS — R29898 Other symptoms and signs involving the musculoskeletal system: Secondary | ICD-10-CM

## 2015-01-20 NOTE — Therapy (Signed)
[facility] [address] [place], [place], [address] Phone: [phone]   Fax:  [phone]  Physical Therapy Treatment  Patient Details  Name: Brent Rasmussen MRN: [id number] Date of Birth: Mar 15, 1960 Referring Provider: Shanon Acevashti day  Encounter Date: 01/20/2015      PT End of Session - 01/20/15 1613    Visit Number 3   Date for PT Re-Evaluation 03/21/15   PT Start Time 1530   PT Stop Time 1610   PT Time Calculation (min) 40 min      Past Medical History  Diagnosis Date  . Diabetes mellitus without complication (HCC)   . Coronary artery disease   . Asthma   . Bell's palsy   . DKA (diabetic ketoacidoses) (HCC)   . Pancreatitis     Past Surgical History  Procedure Laterality Date  . Cardiac catheterization    . Coronary angioplasty with stent placement    . Cardiac catheterization N/A 12/30/2014    Procedure: Left Heart Cath and Coronary Angiography;  Surgeon: Runell GessJonathan J Berry, MD;  [facility];  Service: Cardiovascular;  Laterality: N/A;  . Rotator cuff repair    . Trigger finger release      There were no vitals filed for this visit.  Visit Diagnosis:  Muscular deconditioning  Debility      Subjective Assessment - 01/20/15 1532    Subjective worn out an dlight headed after last session   Currently in Pain? No/denies                         [facility]/Exercise - 01/20/15 0001    Knee/Hip Exercises: Aerobic   Nustep L 5 6 min  O2 97   Knee/Hip Exercises: Machines for Strengthening   Cybex Knee Extension 10# 2x15   Cybex Knee Flexion 25# 2x15   Cybex Leg Press #30 2x15   Knee/Hip Exercises: Standing   Other Standing Knee Exercises scap stab 3 way 15 times each  trunk rotation blue tband 15 times each way   Other Standing Knee Exercises green tband  hip 3 way                PT Education - 01/20/15 1613    Education provided Yes   Education Details tband ther ex as listed in session given as HEP   Person(s) Educated Patient   Methods Explanation;Demonstration;Handout   Comprehension Verbalized understanding;Returned demonstration          PT Short Term Goals - 01/18/15 1741    PT SHORT TERM GOAL #1   Title have a good undetrstanding of his condition and his ability to exercise on own   Time 1   Period Weeks   Status New           PT Long Term Goals - 01/18/15 1742    PT LONG TERM GOAL #1   Title independent with advanced HEP/gym program   Time 6   Period Weeks   Status New   PT LONG TERM GOAL #2   Title report that he can climb stairs without taking a break   Time 8   Period Weeks   Status New   PT LONG TERM GOAL #3   Title ambulate around our building 2 laps without rest   Time 8   Period Weeks   Status New  Plan - 01/20/15 1614    Clinical Impression Statement tolerated session with muti rest breaks,decreased activty as pt felt over did last session and coming back in the morning   PT Next Visit Plan slowly add, continue to monitor O2 saturation and advance as tolerated.        Problem List Patient Active Problem List   Diagnosis Date Noted  . Iron deficiency anemia 12/31/2014  . Acute on chronic systolic CHF (congestive heart failure) (HCC)   . Uncontrolled type 2 diabetes mellitus with complication (HCC)   . Demand ischemia (HCC)   . NSTEMI (non-ST elevated myocardial infarction) (HCC)   . Pulmonary edema   . Toxic metabolic encephalopathy   . Hypernatremia   . Acute systolic CHF (congestive heart failure) (HCC) 12/21/2014  . NSVT (nonsustained ventricular tachycardia) (HCC) 12/20/2014  . Elevated troponin 12/20/2014  . Hypokalemia   . Acute renal failure (HCC)   . Encounter for nasogastric (NG) tube placement   . Acute respiratory failure with hypoxia (HCC)   . Pancreatitis, acute   . Encounter for intubation   . Encounter for orogastric (OG) tube  placement   . Pancreatitis   . DKA (diabetic ketoacidoses) (HCC) 12/14/2014    PAYSEUR,ANGIE PTA 01/20/2015, 4:15 PM  [facility] [place] [facility] [address] [place], [place], [address] Phone: [phone]   Fax:  [phone]  Name: Brent Rasmussen MRN: [id number] Date of Birth: 1960-04-16

## 2015-01-21 ENCOUNTER — Ambulatory Visit: Payer: BLUE CROSS/BLUE SHIELD | Admitting: Physical Therapy

## 2015-01-25 ENCOUNTER — Ambulatory Visit: Payer: BLUE CROSS/BLUE SHIELD | Attending: Internal Medicine | Admitting: Physical Therapy

## 2015-01-25 ENCOUNTER — Encounter: Payer: Self-pay | Admitting: Physical Therapy

## 2015-01-25 DIAGNOSIS — R29898 Other symptoms and signs involving the musculoskeletal system: Secondary | ICD-10-CM | POA: Diagnosis present

## 2015-01-25 DIAGNOSIS — R5381 Other malaise: Secondary | ICD-10-CM | POA: Diagnosis present

## 2015-01-25 NOTE — Therapy (Signed)
[facility] [place] [address] Brent Rasmussen, Brent Rasmussen, Brent Rasmussen Phone: [phone]   Fax:  [phone]  Physical Therapy Treatment  Patient Details  Name: Brent Rasmussen MRN: [id number] Date of Birth: 1961-01-17 Referring Provider: Shanon Ace day  Encounter Date: 01/25/2015      PT End of Session - 01/25/15 1215    Visit Number 4   Date for PT Re-Evaluation 03/21/15   PT Start Time 1145   PT Stop Time 1215   PT Time Calculation (min) 30 min   Activity Tolerance Patient limited by fatigue   Behavior During Therapy Brent Rasmussen for tasks assessed/performed      Past Medical History  Diagnosis Date  . Diabetes mellitus without complication (HCC)   . Coronary artery disease   . Asthma   . Bell's palsy   . DKA (diabetic ketoacidoses) (HCC)   . Pancreatitis     Past Surgical History  Procedure Laterality Date  . Cardiac catheterization    . Coronary angioplasty with stent placement    . Cardiac catheterization N/A 12/30/2014    Procedure: Left Heart Cath and Coronary Angiography;  Surgeon: Runell Gess, MD;  Location: [facility] INVASIVE CV LAB;  Service: Cardiovascular;  Laterality: N/A;  . Rotator cuff repair    . Trigger finger release      There were no vitals filed for this visit.  Visit Diagnosis:  Debility  Muscular deconditioning      Subjective Assessment - 01/25/15 1147    Subjective Pt reports that he is doing fine, "Im getting stronger, Im suppose to go back to work today if my doctor sends a note to my job", " I need to leave a quarter after"   Currently in Pain? No/denies   Pain Score 0-No pain                         [facility] Adult PT Treatment/Exercise - 01/25/15 0001    Knee/Hip Exercises: Aerobic   Nustep L 5 6 min   Other Aerobic UBE constant work 20 watts 4 minutes,    Knee/Hip Exercises: Machines for Strengthening   Cybex Leg Press #30 2x15   Other Machine seated row 35#, lats 35#, Chest  press #35,  2x10 each   Knee/Hip Exercises: Standing   Other Standing Knee Exercises Prone planks 2x10 sec   Knee/Hip Exercises: Seated   Sit to Sand 2 sets;10 reps;without UE support                  PT Short Term Goals - 01/18/15 1741    PT SHORT TERM GOAL #1   Title have a good undetrstanding of his condition and his ability to exercise on own   Time 1   Period Weeks   Status New           PT Long Term Goals - 01/18/15 1742    PT LONG TERM GOAL #1   Title independent with advanced HEP/gym program   Time 6   Period Weeks   Status New   PT LONG TERM GOAL #2   Title report that he can climb stairs without taking a break   Time 8   Period Weeks   Status New   PT LONG TERM GOAL #3   Title ambulate around our building 2 laps without rest   Time 8   Period Weeks   Status New  Plan - 01/25/15 1215    Clinical Impression Statement Pt tolerated session well, required a few rest breaks, Pt request shorten PT session in order to go by his MD and get a note. Pt reports little dizziness transferring from prone to sitting.   Pt will benefit from skilled therapeutic intervention in order to improve on the following deficits Abnormal gait;Cardiopulmonary status limiting activity;Decreased activity tolerance;Decreased balance;Decreased endurance;Decreased mobility;Decreased strength;Difficulty walking   Rehab Potential Good   PT Frequency 3x / week   PT Duration 8 weeks   PT Treatment/Interventions ADLs/Self Care Home Management;Stair training;Gait training;Functional mobility training;Therapeutic activities;Therapeutic exercise;Balance training;Patient/family education;Energy conservation   PT Next Visit Plan slowly add, continue to monitor O2 saturation and advance as tolerated.        Problem List Patient Active Problem List   Diagnosis Date Noted  . Iron deficiency anemia 12/31/2014  . Acute on chronic systolic CHF (congestive heart failure)  (HCC)   . Uncontrolled type 2 diabetes mellitus with complication (HCC)   . Demand ischemia (HCC)   . NSTEMI (non-ST elevated myocardial infarction) (HCC)   . Pulmonary edema   . Toxic metabolic encephalopathy   . Hypernatremia   . Acute systolic CHF (congestive heart failure) (HCC) 12/21/2014  . NSVT (nonsustained ventricular tachycardia) (HCC) 12/20/2014  . Elevated troponin 12/20/2014  . Hypokalemia   . Acute renal failure (HCC)   . Encounter for nasogastric (NG) tube placement   . Acute respiratory failure with hypoxia (HCC)   . Pancreatitis, acute   . Encounter for intubation   . Encounter for orogastric (OG) tube placement   . Pancreatitis   . DKA (diabetic ketoacidoses) (HCC) 12/14/2014    Brent Rasmussen, Brent Rasmussen  01/25/2015, 12:17 PM  Brent Rasmussen, Brent Rasmussen, Brent Rasmussen Phone: [phone]   Fax:  [phone]  Name: Brent Rasmussen MRN: [id number] Date of Birth: 1960-08-26

## 2015-01-27 ENCOUNTER — Ambulatory Visit (INDEPENDENT_AMBULATORY_CARE_PROVIDER_SITE_OTHER): Payer: BLUE CROSS/BLUE SHIELD | Admitting: Physician Assistant

## 2015-01-27 ENCOUNTER — Encounter: Payer: Self-pay | Admitting: Physician Assistant

## 2015-01-27 VITALS — BP 102/64 | HR 92 | Ht 70.0 in | Wt 217.4 lb

## 2015-01-27 DIAGNOSIS — E785 Hyperlipidemia, unspecified: Secondary | ICD-10-CM | POA: Insufficient documentation

## 2015-01-27 DIAGNOSIS — I472 Ventricular tachycardia: Secondary | ICD-10-CM | POA: Diagnosis not present

## 2015-01-27 DIAGNOSIS — I5022 Chronic systolic (congestive) heart failure: Secondary | ICD-10-CM

## 2015-01-27 DIAGNOSIS — I4729 Other ventricular tachycardia: Secondary | ICD-10-CM

## 2015-01-27 MED ORDER — NITROGLYCERIN 0.4 MG SL SUBL: 0.4000 mg | SUBLINGUAL_TABLET | SUBLINGUAL | Status: AC | PRN

## 2015-01-27 NOTE — Patient Instructions (Signed)
Your physician has recommended you make the following change in your medication: STOP ISOSORBIDE (IMDUR)  FOLLOW UP WITH YOUR CARDIOLOGIST AT [facility] IN 2-3 MONTHS

## 2015-01-27 NOTE — Progress Notes (Signed)
Patient ID: Brent Rasmussen, male   DOB: 07-15-1960, 55 y.o.   MRN: [id number]    Date:  01/27/2015   ID:  Brent Rasmussen, DOB 09/10/1960, MRN [id number]  PCP:  No primary care provider on file.  Primary Cardiologist:  At [facility]  Patient presents with  . Follow-up    no chest pain, occassional shortness of breath, no edema, no pain in legs, no cramping in legs, occassional lightheadedness or dizziness     History of Present Illness: Brent Rasmussen is a 55 y.o. male with PMH of T2DM, CAD (prior stent 5-10 years ago at [facility]), Asthma, NSVT,  who developed abdominal pain with associated nausea/vomiting 11/19 with progressive pain that led him to seek urgent care at [facility] where he was diagnosed with acute pancreatitis with hypertriglyceridemia and DKA leading to transfer to [facility]. Cardiology consulted this evening given a run of NSVT (35 beats) in setting of renal failure and K 3.0 with troponin of 4.6. His ECG on 11/22 revealed sinus tachycardia with poor R-waves in V1-V2 consistent with possible previous anterior MI but his 11/26 ECG revealed diffuse anterolateral MI, with no R-waves across the entire precordium.   He was intubated.   Heart catheterization 12/30/2014 showed an occluded LAD after the previously placed stent with left to left collaterals, codominant circumflex with moderate disease in the AV groove. He has high-grade small ramus branch stenosis and high-grade proximal and small first OM branch stenosis. His codominant right is occluded in the midportion with faint left-to-right collaterals. His EF is 45% with anteroapical wall motion abnormality, better than what the 2-D echo suggested and his LVEDP was low at 13. It was unclear the acuity of these blockages but they appeared to be chronic and therefore no current indication to perform intervention.   It was recommended he have an outpatient stress test which was completed on December 23.   t was read as high risk due to multiple and extensive perfusion defects, but no reversible ischemia is identified.  EF of approximately 35%..    Patient presented for posthospital evaluation.  He reports feeling a lot stronger. Currently in [facility]. While he was in the hospital he is weight decreased from 2 or 50-217 pounds. He is currently eating a bland diet because of pancreatitis.  He does complain of constant headache right after taking the Imdur.  He currently denies nausea, vomiting, fever, chest pain, shortness of breath, orthopnea, dizziness, PND, cough, congestion, abdominal pain, hematochezia, melena, lower extremity edema, claudication.  Diagnostic Diagram          Wt Readings from Last 3 Encounters:  01/27/15 217 lb 7 oz (98.629 kg)  01/04/15 237 lb 6.4 oz (107.684 kg)     Past Medical History  Diagnosis Date  . Diabetes mellitus without complication ([place])   . Coronary artery disease   . Asthma   . Bell's palsy   . DKA (diabetic ketoacidoses) ([place])   . Pancreatitis     Current Outpatient Prescriptions  Medication Sig Dispense Refill  . atorvastatin (LIPITOR) 20 MG tablet Take 2 tablets (40 mg total) by mouth daily. 60 tablet 11  . blood glucose meter kit and supplies KIT Dispense based on patient and insurance preference. Use up to four times daily as directed. (FOR ICD-9 250.00, 250.01). 1 each 0  . carvedilol (COREG) 6.25 MG tablet Take 1 tablet (6.25 mg total) by mouth 2 (  two) times daily with a meal. 60 tablet 0  . desloratadine (CLARINEX) 5 MG tablet Take 5 mg by mouth daily. Take 1 tab daily as needed    . EPINEPHrine (EPIPEN 2-PAK) 0.3 mg/0.3 mL IJ SOAJ injection 1 each. Use as directed    . ferrous sulfate 325 (65 FE) MG tablet Take 325 mg by mouth daily. Take 1 tab daily    . glucose blood (ONE TOUCH ULTRA TEST) test strip 1 strip by Other route 4 (four) times daily as needed. Use 1 strip to check glucose 3-4 times a day    .  hydrocortisone cream 0.5 % Apply topically 2 (two) times daily. 30 g 0  . insulin aspart (NOVOLOG FLEXPEN) 100 UNIT/ML FlexPen Inject 12 Units into the skin 3 (three) times daily with meals. 15 mL 11  . Insulin Glargine (LANTUS SOLOSTAR) 100 UNIT/ML Solostar Pen Inject 22 Units into the skin 2 (two) times daily. 15 mL 11  . Insulin Pen Needle (AURORA PEN NEEDLES) 29G X 12MM MISC 12 Units by Does not apply route 3 (three) times daily before meals. 200 each 0  . levocetirizine (XYZAL) 5 MG tablet Take 5 mg by mouth at bedtime as needed for allergies.     . pantoprazole (PROTONIX) 40 MG tablet Take 1 tablet (40 mg total) by mouth daily. 30 tablet 0  . polyethylene glycol (MIRALAX / GLYCOLAX) packet Take 17 g by mouth 2 (two) times daily as needed for moderate constipation. 14 each 0  . polyvinyl alcohol (LIQUIFILM TEARS) 1.4 % ophthalmic solution Place 1 drop into both eyes as needed for dry eyes. 15 mL 0  . SYMBICORT 160-4.5 MCG/ACT inhaler Inhale 2 puffs into the lungs 2 (two) times daily as needed (shortness of breath). 1 Inhaler 10  . nitroGLYCERIN (NITROSTAT) 0.4 MG SL tablet Place 1 tablet (0.4 mg total) under the tongue every 5 (five) minutes as needed for chest pain. 25 tablet 3   No current facility-administered medications for this visit.    Allergies:    Allergies  Allergen Reactions  . Isotretinoin Other (See Comments)    Blurred vision, concern for possible angle closure but not witnessed by MD.  Caution with similar medications, sulfa, topomax in the future which can also cause angle closure.  Patient will need an eye exam if starts these medications.  . Other Hives    Shrimp  . Sulfa Antibiotics Other (See Comments)    Asthma attack    Social History:  The patient  reports that he has never smoked. He has never used smokeless tobacco. He reports that he drinks alcohol. He reports that he does not use illicit drugs.   Family history:  No family history on file.  ROS:  Please  see the history of present illness.  All other systems reviewed and negative.   PHYSICAL EXAM: VS:  BP 102/64 mmHg  Pulse 92  Ht _0  (1.778 m)  Wt 217 lb 7 oz (98.629 kg)  BMI 31.20 kg/m2 Obese, well developed, in no acute distress HEENT: Pupils are equal round react to light accommodation extraocular movements are intact.  Neck: no JVDNo cervical lymphadenopathy. Cardiac: Regular rate and rhythm without murmurs rubs or gallops. Lungs:  clear to auscultation bilaterally, no wheezing, rhonchi or rales Abd: soft, nontender, positive bowel sounds all quadrants, no hepatosplenomegaly Ext: no lower extremity edema.  2+ radial and dorsalis pedis pulses. Skin: warm and dry Neuro:  Grossly normal      ASSESSMENT  AND PLAN:  Problem List Items Addressed This Visit    NSVT (nonsustained ventricular tachycardia) (HCC) - Primary   Relevant Medications   EPINEPHrine (EPIPEN 2-PAK) 0.3 mg/0.3 mL IJ SOAJ injection   nitroGLYCERIN (NITROSTAT) 0.4 MG SL tablet   Hyperlipidemia   Relevant Medications   EPINEPHrine (EPIPEN 2-PAK) 0.3 mg/0.3 mL IJ SOAJ injection   nitroGLYCERIN (NITROSTAT) 0.4 MG SL tablet   Chronic systolic heart failure (HCC)   Relevant Medications   EPINEPHrine (EPIPEN 2-PAK) 0.3 mg/0.3 mL IJ SOAJ injection   nitroGLYCERIN (NITROSTAT) 0.4 MG SL tablet     Patient appears to be doing very well. 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 Does continue to have headaches daily after taking Imdur. Discontinue and see how he does.  I have added some sublingual nitroglycerin to take an emergency. He does have a blood pressure on the low side, so if he does need to take it, I told him to monitor his blood pressure carefully.  He wishes to follow-up with his cardiologist at [place].

## 2015-01-28 ENCOUNTER — Ambulatory Visit: Payer: BLUE CROSS/BLUE SHIELD | Admitting: Physical Therapy

## 2015-01-28 ENCOUNTER — Encounter: Payer: Self-pay | Admitting: Physical Therapy

## 2015-01-28 DIAGNOSIS — R5381 Other malaise: Secondary | ICD-10-CM | POA: Diagnosis not present

## 2015-01-28 DIAGNOSIS — R29898 Other symptoms and signs involving the musculoskeletal system: Secondary | ICD-10-CM

## 2015-01-28 NOTE — Therapy (Signed)
[facility] [address] [place], [place], [address] Phone: [phone]   Fax:  [phone]  Physical Therapy Treatment  Patient Details  Name: Brent ShamesDaniel J Rasmussen MRN: [id number] Date of Birth: 04-26-1960 Referring Provider: Shanon Acevashti day  Encounter Date: 01/28/2015      PT End of Session - 01/28/15 1051    Visit Number 5   Date for PT Re-Evaluation 03/21/15   PT Start Time 1015   PT Stop Time 1101   PT Time Calculation (min) 46 min   Activity Tolerance Patient limited by fatigue   Behavior [facility] for tasks assessed/performed      Past Medical History  Diagnosis Date  . Diabetes mellitus without complication (HCC)   . Coronary artery disease   . Asthma   . Bell's palsy   . DKA (diabetic ketoacidoses) (HCC)   . Pancreatitis     Past Surgical History  Procedure Laterality Date  . Cardiac catheterization    . Coronary angioplasty with stent placement    . Cardiac catheterization N/A 12/30/2014    Procedure: Left Heart Cath and Coronary Angiography;  Surgeon: Runell GessJonathan J Berry, MD;  [facility] LAB;  Service: Cardiovascular;  Laterality: N/A;  . Rotator cuff repair    . Trigger finger release      There were no vitals filed for this visit.  Visit Diagnosis:  Debility  Muscular deconditioning      Subjective Assessment - 01/28/15 1027    Subjective I am feeling better, went to work for 4 hours yesterday and did okay.   Currently in Pain? No/denies                         [facility] Treatment/Exercise - 01/28/15 0001    Knee/Hip Exercises: Aerobic   Stationary Bike level 2 x 5-6 minutes   Other Aerobic UBE constant work 25 watts 4 minutes,    Knee/Hip Exercises: Machines for Strengthening   Cybex Knee Extension 10# 2x15   Cybex Knee Flexion 25# 2x15   Cybex Leg Press #50 2x15   Hip Cybex 5# hip abduction and extension 2x15   Other Machine seated row 35#,  lats 35#, Chest press #35,  2x10 each   Knee/Hip Exercises: Seated   Sit to Sand 2 sets;10 reps;without UE support                  PT Short Term Goals - 01/18/15 1741    PT SHORT TERM GOAL #1   Title have a good undetrstanding of his condition and his ability to exercise on own   Time 1   Period Weeks   Status New           PT Long Term Goals - 01/18/15 1742    PT LONG TERM GOAL #1   Title independent with advanced HEP/gym program   Time 6   Period Weeks   Status New   PT LONG TERM GOAL #2   Title report that he can climb stairs without taking a break   Time 8   Period Weeks   Status New   PT LONG TERM GOAL #3   Title ambulate around our building 2 laps without rest   Time 8   Period Weeks   Status New               Plan - 01/28/15 1052    Clinical  Impression Statement Doing very well overall with less difficulty, still reporting fatigue after the sessions here.  But was able to    PT Next Visit Plan Multiple joint exercises may help as we go   Consulted and Agree with Plan of Care Patient        Problem List Patient Active Problem List   Diagnosis Date Noted  . Chronic systolic heart failure (HCC) 01/27/2015  . Hyperlipidemia 01/27/2015  . Iron deficiency anemia 12/31/2014  . Acute on chronic systolic CHF (congestive heart failure) (HCC)   . Uncontrolled type 2 diabetes mellitus with complication (HCC)   . Demand ischemia (HCC)   . NSTEMI (non-ST elevated myocardial infarction) (HCC)   . Pulmonary edema   . Toxic metabolic encephalopathy   . Hypernatremia   . Acute systolic CHF (congestive heart failure) (HCC) 12/21/2014  . NSVT (nonsustained ventricular tachycardia) (HCC) 12/20/2014  . Elevated troponin 12/20/2014  . Hypokalemia   . Acute renal failure (HCC)   . Encounter for nasogastric (NG) tube placement   . Acute respiratory failure with hypoxia (HCC)   . Pancreatitis, acute   . Encounter for intubation   . Encounter for  orogastric (OG) tube placement   . Pancreatitis   . DKA (diabetic ketoacidoses) (HCC) 12/14/2014    Jearld Lesch., PT 01/28/2015, 10:56 AM  [facility] [place] [facility] [address] [place], [place], [address] Phone: [phone]   Fax:  [phone]  Name: Brent Rasmussen MRN: [id number] Date of Birth: Dec 31, 1960

## 2015-01-31 ENCOUNTER — Encounter: Payer: Self-pay | Admitting: Physical Therapy

## 2015-01-31 ENCOUNTER — Ambulatory Visit: Payer: BLUE CROSS/BLUE SHIELD | Admitting: Physical Therapy

## 2015-01-31 DIAGNOSIS — R29898 Other symptoms and signs involving the musculoskeletal system: Secondary | ICD-10-CM

## 2015-01-31 DIAGNOSIS — R5381 Other malaise: Secondary | ICD-10-CM | POA: Diagnosis not present

## 2015-01-31 NOTE — Therapy (Signed)
[facility] [place] [facility] [address] [place], [place], [address] Phone: [phone]   Fax:  [phone]  Physical Therapy Treatment  Patient Details  Name: Brent Rasmussen MRN: [id number] Date of Birth: February 08, 1960 Referring Provider: Shanon Ace day  Encounter Date: 01/31/2015      PT End of Session - 01/31/15 1456    Visit Number 6   Date for PT Re-Evaluation 03/21/15   PT Start Time 1359   PT Stop Time 1455   PT Time Calculation (min) 56 min   Activity Tolerance Patient tolerated treatment well   Behavior During Therapy [facility] for tasks assessed/performed      Past Medical History  Diagnosis Date  . Diabetes mellitus without complication (HCC)   . Coronary artery disease   . Asthma   . Bell's palsy   . DKA (diabetic ketoacidoses) (HCC)   . Pancreatitis     Past Surgical History  Procedure Laterality Date  . Cardiac catheterization    . Coronary angioplasty with stent placement    . Cardiac catheterization N/A 12/30/2014    Procedure: Left Heart Cath and Coronary Angiography;  Surgeon: Runell Gess, MD;  Location: [facility] INVASIVE CV LAB;  Service: Cardiovascular;  Laterality: N/A;  . Rotator cuff repair    . Trigger finger release      There were no vitals filed for this visit.  Visit Diagnosis:  Debility  Muscular deconditioning      Subjective Assessment - 01/31/15 1403    Subjective I am doing well.  Not too bad.  Will see MD on Friday, may try to go back to work full time after Friday.   Currently in Pain? No/denies                         [facility] Adult PT Treatment/Exercise - 01/31/15 0001    Knee/Hip Exercises: Machines for Strengthening   Cybex Knee Extension 10# 2x15   Cybex Knee Flexion 25# 2x15   Cybex Leg Press 50# 2x15   Hip Cybex 5# hip abduction and extension 2x15   Other Machine seated row 35#, lats 35#, Chest press #35,  2x10 each. UE circuit with 5# weight                   PT Short Term Goals - 01/18/15 1741    PT SHORT TERM GOAL #1   Title have a good undetrstanding of his condition and his ability to exercise on own   Time 1   Period Weeks   Status New           PT Long Term Goals - 01/18/15 1742    PT LONG TERM GOAL #1   Title independent with advanced HEP/gym program   Time 6   Period Weeks   Status New   PT LONG TERM GOAL #2   Title report that he can climb stairs without taking a break   Time 8   Period Weeks   Status New   PT LONG TERM GOAL #3   Title ambulate around our building 2 laps without rest   Time 8   Period Weeks   Status New               Plan - 01/31/15 1457    Clinical Impression Statement Pt is doing well and was not limited by fatigue this visit. Pt was able to work up to a multi-joint UE circuit  workout. He stated he was "almost out of energy" at the end of the session.   Pt will benefit from skilled therapeutic intervention in order to improve on the following deficits Abnormal gait;Cardiopulmonary status limiting activity;Decreased activity tolerance;Decreased balance;Decreased endurance;Decreased mobility;Decreased strength;Difficulty walking   Rehab Potential Good   PT Frequency 3x / week   PT Duration 8 weeks   PT Treatment/Interventions ADLs/Self Care Home Management;Stair training;Gait training;Functional mobility training;Therapeutic activities;Therapeutic exercise;Balance training;Patient/family education;Energy conservation   PT Next Visit Plan multiple joint exercises for UE and LE   Consulted and Agree with Plan of Care Patient        Problem List Patient Active Problem List   Diagnosis Date Noted  . Chronic systolic heart failure (HCC) 01/27/2015  . Hyperlipidemia 01/27/2015  . Iron deficiency anemia 12/31/2014  . Acute on chronic systolic CHF (congestive heart failure) (HCC)   . Uncontrolled type 2 diabetes mellitus with complication (HCC)   . Demand ischemia  (HCC)   . NSTEMI (non-ST elevated myocardial infarction) (HCC)   . Pulmonary edema   . Toxic metabolic encephalopathy   . Hypernatremia   . Acute systolic CHF (congestive heart failure) (HCC) 12/21/2014  . NSVT (nonsustained ventricular tachycardia) (HCC) 12/20/2014  . Elevated troponin 12/20/2014  . Hypokalemia   . Acute renal failure (HCC)   . Encounter for nasogastric (NG) tube placement   . Acute respiratory failure with hypoxia (HCC)   . Pancreatitis, acute   . Encounter for intubation   . Encounter for orogastric (OG) tube placement   . Pancreatitis   . DKA (diabetic ketoacidoses) (HCC) 12/14/2014    Vicie MuttersMorgan Raesha Coonrod, SPT 01/31/2015, 3:18 PM  [facility] [address] [place], [place], [address] Phone: [phone]   Fax:  [phone]  Name: Brent Rasmussen MRN: [id number] Date of Birth: 04/12/1960

## 2015-02-01 ENCOUNTER — Telehealth: Payer: Self-pay | Admitting: *Deleted

## 2015-02-01 NOTE — Telephone Encounter (Signed)
01/31/2015  Signed orders for SN /PT faxed to [facility].

## 2015-02-03 ENCOUNTER — Ambulatory Visit: Payer: BLUE CROSS/BLUE SHIELD | Admitting: Physical Therapy

## 2015-02-08 ENCOUNTER — Ambulatory Visit: Payer: BLUE CROSS/BLUE SHIELD | Admitting: Physical Therapy

## 2015-02-08 DIAGNOSIS — R29898 Other symptoms and signs involving the musculoskeletal system: Secondary | ICD-10-CM

## 2015-02-08 DIAGNOSIS — R5381 Other malaise: Secondary | ICD-10-CM | POA: Diagnosis not present

## 2015-02-08 NOTE — Therapy (Signed)
[facility] [place] [address] [place], [place], [address] Phone: [phone]   Fax:  [phone]  Physical Therapy Treatment  Patient Details  Name: Brent Rasmussen MRN: [id number] Date of Birth: 1960-05-17 Referring Provider: Shanon Ace day  Encounter Date: 02/08/2015      PT End of Session - 02/08/15 0841    Visit Number 7   Date for PT Re-Evaluation 03/21/15   PT Start Time 0805   PT Stop Time 0840   PT Time Calculation (min) 35 min   Activity Tolerance Patient tolerated treatment well   Behavior During Therapy [facility] for tasks assessed/performed      Past Medical History  Diagnosis Date  . Diabetes mellitus without complication (HCC)   . Coronary artery disease   . Asthma   . Bell's palsy   . DKA (diabetic ketoacidoses) (HCC)   . Pancreatitis     Past Surgical History  Procedure Laterality Date  . Cardiac catheterization    . Coronary angioplasty with stent placement    . Cardiac catheterization N/A 12/30/2014    Procedure: Left Heart Cath and Coronary Angiography;  Surgeon: Runell Gess, MD;  Location: [facility] INVASIVE CV LAB;  Service: Cardiovascular;  Laterality: N/A;  . Rotator cuff repair    . Trigger finger release      There were no vitals filed for this visit.  Visit Diagnosis:  Debility  Muscular deconditioning      Subjective Assessment - 02/08/15 0807    Subjective Pt reprts he was sick last week. He says he saw the MD last week and was cleared to go back to work full time. He worked a full day yesterday and states he did not have any problems. He said he would need to leave early today to get to work on time. Pt states he would like to gain back his strength and stamina.   Currently in Pain? No/denies                         [facility] Adult PT Treatment/Exercise - 02/08/15 0001    Knee/Hip Exercises: Aerobic   Elliptical I0 R2 x 5 minutes   Other Aerobic UBE constant work 25  watts 4 minutes,    Knee/Hip Exercises: Machines for Strengthening   Cybex Knee Extension 10# 2x15   Cybex Knee Flexion 25# 2x15   Cybex Leg Press 50# 2x15   Hip Cybex 5# hip abduction and extension 2x15   Other Machine UE circuit with 5# weight, lung with bicep curl 5# x 10                  PT Short Term Goals - 01/18/15 1741    PT SHORT TERM GOAL #1   Title have a good undetrstanding of his condition and his ability to exercise on own   Time 1   Period Weeks   Status New           PT Long Term Goals - 01/18/15 1742    PT LONG TERM GOAL #1   Title independent with advanced HEP/gym program   Time 6   Period Weeks   Status New   PT LONG TERM GOAL #2   Title report that he can climb stairs without taking a break   Time 8   Period Weeks   Status New   PT LONG TERM GOAL #3   Title ambulate around our building 2  laps without rest   Time 8   Period Weeks   Status New               Plan - 02/08/15 0843    Clinical Impression Statement Pt tolerated all exercises well. Starting to do more multi-joint UE exercises but Pt states he would like to continue strengthening his LEs.    Pt will benefit from skilled therapeutic intervention in order to improve on the following deficits Abnormal gait;Cardiopulmonary status limiting activity;Decreased activity tolerance;Decreased balance;Decreased endurance;Decreased mobility;Decreased strength;Difficulty walking   Rehab Potential Good   PT Frequency 3x / week   PT Duration 8 weeks   PT Treatment/Interventions ADLs/Self Care Home Management;Stair training;Gait training;Functional mobility training;Therapeutic activities;Therapeutic exercise;Balance training;Patient/family education;Energy conservation   PT Next Visit Plan multi-joint UE exercises   Consulted and Agree with Plan of Care Patient        Problem List Patient Active Problem List   Diagnosis Date Noted  . Chronic systolic heart failure (HCC) 01/27/2015   . Hyperlipidemia 01/27/2015  . Iron deficiency anemia 12/31/2014  . Acute on chronic systolic CHF (congestive heart failure) (HCC)   . Uncontrolled type 2 diabetes mellitus with complication (HCC)   . Demand ischemia (HCC)   . NSTEMI (non-ST elevated myocardial infarction) (HCC)   . Pulmonary edema   . Toxic metabolic encephalopathy   . Hypernatremia   . Acute systolic CHF (congestive heart failure) (HCC) 12/21/2014  . NSVT (nonsustained ventricular tachycardia) (HCC) 12/20/2014  . Elevated troponin 12/20/2014  . Hypokalemia   . Acute renal failure (HCC)   . Encounter for nasogastric (NG) tube placement   . Acute respiratory failure with hypoxia (HCC)   . Pancreatitis, acute   . Encounter for intubation   . Encounter for orogastric (OG) tube placement   . Pancreatitis   . DKA (diabetic ketoacidoses) (HCC) 12/14/2014   AP[place]seur PTA Vicie Mutters, SPT 02/08/2015, 8:46 AM  [facility] [place] [address] [place], [place], [address] Phone: [phone]   Fax:  [phone]  Name: Brent Rasmussen MRN: [id number] Date of Birth: 08-30-1960

## 2015-02-15 ENCOUNTER — Ambulatory Visit: Payer: BLUE CROSS/BLUE SHIELD | Admitting: Physical Therapy

## 2015-02-17 ENCOUNTER — Ambulatory Visit: Payer: BLUE CROSS/BLUE SHIELD | Admitting: Physical Therapy

## 2015-02-17 ENCOUNTER — Encounter: Payer: Self-pay | Admitting: Physical Therapy

## 2015-02-17 DIAGNOSIS — R5381 Other malaise: Secondary | ICD-10-CM | POA: Diagnosis not present

## 2015-02-17 DIAGNOSIS — R29898 Other symptoms and signs involving the musculoskeletal system: Secondary | ICD-10-CM

## 2015-02-17 NOTE — Therapy (Signed)
[facility] [place] [address] [place], [place], [address] Phone: [phone]   Fax:  [phone]  Physical Therapy Treatment  Patient Details  Name: Brent Rasmussen MRN: [id number] Date of Birth: Sep 05, 1960 Referring Provider: Shanon Ace day  Encounter Date: 02/17/2015      PT End of Session - 02/17/15 0916    Visit Number 8   Date for PT Re-Evaluation 03/21/15   PT Start Time 0842   PT Stop Time 0930   PT Time Calculation (min) 48 min      Past Medical History  Diagnosis Date  . Diabetes mellitus without complication (HCC)   . Coronary artery disease   . Asthma   . Bell's palsy   . DKA (diabetic ketoacidoses) (HCC)   . Pancreatitis     Past Surgical History  Procedure Laterality Date  . Cardiac catheterization    . Coronary angioplasty with stent placement    . Cardiac catheterization N/A 12/30/2014    Procedure: Left Heart Cath and Coronary Angiography;  Surgeon: Runell Gess, MD;  Location: [facility] INVASIVE CV LAB;  Service: Cardiovascular;  Laterality: N/A;  . Rotator cuff repair    . Trigger finger release      There were no vitals filed for this visit.  Visit Diagnosis:  Debility  Muscular deconditioning      Subjective Assessment - 02/17/15 0845    Subjective saw cardiologist yesterday- wants me to focus on cardio, heart only 50% back. I think coming 1 time week is fine and I will start to transition to [employer]. MD wants me tibe at 30 min cardio 5 days a week.   Currently in Pain? No/denies                         [facility] Adult PT Treatment/Exercise - 02/17/15 0001    Knee/Hip Exercises: Aerobic   Elliptical 3 fwd/3back I 6 R 4   Recumbent Bike 6 min L2   Nustep L 5 6 min   Other Aerobic UBE L3 2 fwd/2 backward   Knee/Hip Exercises: Machines for Strengthening   Cybex Knee Extension 15# 2 sets 15   Cybex Knee Flexion 35# 2x15   Cybex Leg Press 60# 2x15   Other Machine lat  pull and seated row 25# 2 sets 15                  PT Short Term Goals - 01/18/15 1741    PT SHORT TERM GOAL #1   Title have a good undetrstanding of his condition and his ability to exercise on own   Time 1   Period Weeks   Status New           PT Long Term Goals - 02/17/15 0915    PT LONG TERM GOAL #1   Title independent with advanced HEP/gym program   Status On-going   PT LONG TERM GOAL #2   Title report that he can climb stairs without taking a break   Status On-going   PT LONG TERM GOAL #3   Title ambulate around our building 2 laps without rest   Baseline 1 rest needed   Status On-going               Plan - 02/17/15 0917    Clinical Impression Statement pt tolerated 22 min of cardio with 1 break ( 1-2 min). Discussed cardio progression at home.  PT Next Visit Plan Cardio        Problem List Patient Active Problem List   Diagnosis Date Noted  . Chronic systolic heart failure (HCC) 01/27/2015  . Hyperlipidemia 01/27/2015  . Iron deficiency anemia 12/31/2014  . Acute on chronic systolic CHF (congestive heart failure) (HCC)   . Uncontrolled type 2 diabetes mellitus with complication (HCC)   . Demand ischemia (HCC)   . NSTEMI (non-ST elevated myocardial infarction) (HCC)   . Pulmonary edema   . Toxic metabolic encephalopathy   . Hypernatremia   . Acute systolic CHF (congestive heart failure) (HCC) 12/21/2014  . NSVT (nonsustained ventricular tachycardia) (HCC) 12/20/2014  . Elevated troponin 12/20/2014  . Hypokalemia   . Acute renal failure (HCC)   . Encounter for nasogastric (NG) tube placement   . Acute respiratory failure with hypoxia (HCC)   . Pancreatitis, acute   . Encounter for intubation   . Encounter for orogastric (OG) tube placement   . Pancreatitis   . DKA (diabetic ketoacidoses) (HCC) 12/14/2014    Annalie Wenner,ANGIE PTA 02/17/2015, 9:22 AM  [facility] [place] [address] [place], [place], [address] Phone: [phone]   Fax:  [phone]  Name: Brent Rasmussen MRN: [id number] Date of Birth: [birth year]

## 2015-02-24 ENCOUNTER — Ambulatory Visit: Payer: BLUE CROSS/BLUE SHIELD | Admitting: Physical Therapy

## 2015-03-02 ENCOUNTER — Ambulatory Visit: Payer: BLUE CROSS/BLUE SHIELD | Attending: Internal Medicine | Admitting: Physical Therapy

## 2015-03-02 ENCOUNTER — Encounter: Payer: Self-pay | Admitting: Physical Therapy

## 2015-03-02 DIAGNOSIS — R5381 Other malaise: Secondary | ICD-10-CM | POA: Insufficient documentation

## 2015-03-02 DIAGNOSIS — R29898 Other symptoms and signs involving the musculoskeletal system: Secondary | ICD-10-CM | POA: Insufficient documentation

## 2015-03-02 NOTE — Therapy (Signed)
[facility] [place] [address] [place], [place], [address] Phone: [phone]   Fax:  [phone]  Physical Therapy Treatment  Patient Details  Name: Brent Rasmussen MRN: [id number] Date of Birth: Jan 05, 1961 Referring Provider: Shanon Ace day  Encounter Date: 03/02/2015      PT End of Session - 03/02/15 1739    Visit Number 9   Date for PT Re-Evaluation 03/21/15   PT Start Time 1703   PT Stop Time 1746   PT Time Calculation (min) 43 min   Activity Tolerance Patient tolerated treatment well   Behavior During Therapy [facility] for tasks assessed/performed      Past Medical History  Diagnosis Date  . Diabetes mellitus without complication (HCC)   . Coronary artery disease   . Asthma   . Bell's palsy   . DKA (diabetic ketoacidoses) (HCC)   . Pancreatitis     Past Surgical History  Procedure Laterality Date  . Cardiac catheterization    . Coronary angioplasty with stent placement    . Cardiac catheterization N/A 12/30/2014    Procedure: Left Heart Cath and Coronary Angiography;  Surgeon: Runell Gess, MD;  Location: [facility] INVASIVE CV LAB;  Service: Cardiovascular;  Laterality: N/A;  . Rotator cuff repair    . Trigger finger release      There were no vitals filed for this visit.  Visit Diagnosis:  Debility  Muscular deconditioning      Subjective Assessment - 03/02/15 1704    Subjective No problems after last visit, MD wants me to focus on cardio and do less weights.   Currently in Pain? No/denies                         [facility] Adult PT Treatment/Exercise - 03/02/15 0001    Knee/Hip Exercises: Aerobic   Elliptical 3 fwd/3back I 6 R 4   Tread Mill 3 minutes 6% incline at 2.5 mph, backward 1.5 mph at an 8% incline, side stepping 1 minute each side   Other Aerobic UBE constant work 30 watts x 4 minutes   Knee/Hip Exercises: IT consultant UE circuit 6#, LE  circuit 2 rounds of each                  PT Short Term Goals - 01/18/15 1741    PT SHORT TERM GOAL #1   Title have a good undetrstanding of his condition and his ability to exercise on own   Time 1   Period Weeks   Status New           PT Long Term Goals - 03/02/15 1746    PT LONG TERM GOAL #2   Title report that he can climb stairs without taking a break   Status Achieved               Plan - 03/02/15 1745    Clinical Impression Statement Patient very fatigued "in legs" after the LE circuit.  Tolerated some new things well, fatigue in legs with the tmill work as well.   PT Next Visit Plan Cardio   Consulted and Agree with Plan of Care Patient        Problem List Patient Active Problem List   Diagnosis Date Noted  . Chronic systolic heart failure (HCC) 01/27/2015  . Hyperlipidemia 01/27/2015  . Iron deficiency anemia 12/31/2014  . Acute on chronic systolic CHF (  congestive heart failure) (HCC)   . Uncontrolled type 2 diabetes mellitus with complication (HCC)   . Demand ischemia (HCC)   . NSTEMI (non-ST elevated myocardial infarction) (HCC)   . Pulmonary edema   . Toxic metabolic encephalopathy   . Hypernatremia   . Acute systolic CHF (congestive heart failure) (HCC) 12/21/2014  . NSVT (nonsustained ventricular tachycardia) (HCC) 12/20/2014  . Elevated troponin 12/20/2014  . Hypokalemia   . Acute renal failure (HCC)   . Encounter for nasogastric (NG) tube placement   . Acute respiratory failure with hypoxia (HCC)   . Pancreatitis, acute   . Encounter for intubation   . Encounter for orogastric (OG) tube placement   . Pancreatitis   . DKA (diabetic ketoacidoses) (HCC) 12/14/2014    Jearld Lesch., PT 03/02/2015, 5:48 PM  [facility] [place] [address] [place], [place], [address] Phone: [phone]   Fax:  [phone]  Name: ANGELLO CHIEN MRN: [id number] Date of Birth:  1960/11/13

## 2015-03-09 ENCOUNTER — Ambulatory Visit: Payer: BLUE CROSS/BLUE SHIELD | Admitting: Physical Therapy

## 2015-03-17 ENCOUNTER — Ambulatory Visit: Payer: BLUE CROSS/BLUE SHIELD | Admitting: Physical Therapy

## 2015-03-17 DIAGNOSIS — R5381 Other malaise: Secondary | ICD-10-CM | POA: Diagnosis not present

## 2015-03-17 NOTE — Therapy (Signed)
[place], [place], [address] Phone: [phone]   Fax:  [phone]  Physical Therapy Treatment  Patient Details  Name: Brent Rasmussen MRN: [id number] Date of Birth: [birth year] Referring Provider: Jenne Pane day  Encounter Date: 03/17/2015      PT End of Session - 03/17/15 1741    Visit Number 10   Date for PT Re-Evaluation 03/21/15   PT Start Time 1702   PT Stop Time 1742   PT Time Calculation (min) 40 min      Past Medical History  Diagnosis Date  . Diabetes mellitus without complication ([place])   . Coronary artery disease   . Asthma   . Bell's palsy   . DKA (diabetic ketoacidoses) ([place])   . Pancreatitis     Past Surgical History  Procedure Laterality Date  . Cardiac catheterization    . Coronary angioplasty with stent placement    . Cardiac catheterization N/A 12/30/2014    Procedure: Left Heart Cath and Coronary Angiography;  Surgeon: Lorretta Harp, MD;  Location: [place] CV LAB;  Service: Cardiovascular;  Laterality: N/A;  . Rotator cuff repair    . Trigger finger release      There were no vitals filed for this visit.  Visit Diagnosis:  Debility      Subjective Assessment - 03/17/15 1712    Subjective 20-30 on TM at home, doing some ex we did in PT. Being more active   Currently in Pain? No/denies                         [facility] Adult PT Treatment/Exercise - 03/17/15 0001    Knee/Hip Exercises: Aerobic   Elliptical 3 fwd/3back I 6 R 4   Other Aerobic UBE constant work 30 watts x 6 minutes   Knee/Hip Exercises: Field seismologist for Teacher, English as a foreign language UE circuit 8#   Knee/Hip Exercises: Standing   Other Standing Knee Exercises alt 8inch taps,wall push ups, step up 6in and 10# bicep curls 15 reps 2 sets  side shuffle with wt ball toss   Other Standing Knee Exercises 30 sec- alt lunge,jacks,squat OH, high knees 2 min  O2 97 after                   PT Short Term Goals - 03/17/15 1742    PT SHORT TERM GOAL #1   Title have a good undetrstanding of his condition and his ability to exercise on own   Status Partially Met           PT Long Term Goals - 03/17/15 1742    PT LONG TERM GOAL #1   Title independent with advanced HEP/gym program   Status On-going   PT LONG TERM GOAL #2   Title report that he can climb stairs without taking a break   Status Achieved   PT LONG TERM GOAL #3   Title ambulate around our building 2 laps without rest   Status On-going               Plan - 03/17/15 1742    Clinical Impression Statement significant improvement in ability to do cardio at a higher level with less recovery time. Still fatigues with combo exercises.    PT Next Visit Plan Cardio with Strength ex as pt weak in UE and LE        Problem List Patient Active  Problem List   Diagnosis Date Noted  . Chronic systolic heart failure ([place]) 01/27/2015  . Hyperlipidemia 01/27/2015  . Iron deficiency anemia 12/31/2014  . Acute on chronic systolic CHF (congestive heart failure) ([place])   . Uncontrolled type 2 diabetes mellitus with complication ([place])   . Demand ischemia ([place])   . NSTEMI (non-ST elevated myocardial infarction) ([place])   . Pulmonary edema   . Toxic metabolic encephalopathy   . Hypernatremia   . Acute systolic CHF (congestive heart failure) ([place]) 12/21/2014  . NSVT (nonsustained ventricular tachycardia) ([place]) 12/20/2014  . Elevated troponin 12/20/2014  . Hypokalemia   . Acute renal failure ([place])   . Encounter for nasogastric (NG) tube placement   . Acute respiratory failure with hypoxia ([place])   . Pancreatitis, acute   . Encounter for intubation   . Encounter for orogastric (OG) tube placement   . Pancreatitis   . DKA (diabetic ketoacidoses) ([place]) 12/14/2014    Kaye Mitro,ANGIE PTA 03/17/2015, 5:44 PM  [place] [address] [place], [place], [address] Phone: [phone]   Fax:  [phone]  Name: BREYLEN AGYEMAN MRN: [id number] Date of Birth: May 10, 1960

## 2015-03-30 ENCOUNTER — Ambulatory Visit: Payer: BLUE CROSS/BLUE SHIELD | Attending: Internal Medicine | Admitting: Physical Therapy

## 2015-03-30 ENCOUNTER — Encounter: Payer: Self-pay | Admitting: Physical Therapy

## 2015-03-30 DIAGNOSIS — R29898 Other symptoms and signs involving the musculoskeletal system: Secondary | ICD-10-CM | POA: Insufficient documentation

## 2015-03-30 DIAGNOSIS — R5381 Other malaise: Secondary | ICD-10-CM | POA: Insufficient documentation

## 2015-03-30 NOTE — Therapy (Addendum)
[place], [place], [address] Phone: [phone]   Fax:  [phone]  Physical Therapy Treatment  Patient Details  Name: Brent Rasmussen MRN: [id number] Date of Birth: [birth year] Referring Provider: Jenne Pane day  Encounter Date: 03/30/2015      PT End of Session - 03/30/15 1753    Visit Number 11   Date for PT Re-Evaluation 04/30/15   PT Start Time 1702   PT Stop Time 1755   PT Time Calculation (min) 53 min   Activity Tolerance Patient tolerated treatment well   Behavior During Therapy [facility] for tasks assessed/performed      Past Medical History  Diagnosis Date  . Diabetes mellitus without complication ([place])   . Coronary artery disease   . Asthma   . Bell's palsy   . DKA (diabetic ketoacidoses) ([place])   . Pancreatitis     Past Surgical History  Procedure Laterality Date  . Cardiac catheterization    . Coronary angioplasty with stent placement    . Cardiac catheterization N/A 12/30/2014    Procedure: Left Heart Cath and Coronary Angiography;  Surgeon: Lorretta Harp, MD;  Location: [place] CV LAB;  Service: Cardiovascular;  Laterality: N/A;  . Rotator cuff repair    . Trigger finger release      There were no vitals filed for this visit.  Visit Diagnosis:  Debility - Plan: PT plan of care cert/re-cert  Muscular deconditioning - Plan: PT plan of care cert/re-cert      Subjective Assessment - 03/30/15 1704    Subjective Reports that he got out in the yard over the past weekend and was able to do some work without it really wiping me out   Currently in Pain? No/denies            [facility] PT Assessment - 03/30/15 0001    Strength   Overall Strength Comments 4+/5 for UE and LE's, cannot do a full squat with weight and cannot do a regular pushup so functional strength remains limited                     [facility] Adult PT Treatment/Exercise - 03/30/15 0001    Knee/Hip Exercises: Aerobic   Elliptical 4 minutes at 85 steps per minute, then 2 minutes trying to stay above 115 steps a minute   Knee/Hip Exercises: Machines for Strengthening   Cybex Leg Press 80#, 100# and 110# 10x each   Total Gym Leg Press single leg 30#   Other Machine UE circuit 8#   Knee/Hip Exercises: Standing   Other Standing Knee Exercises core circuit   Other Standing Knee Exercises tabata modified pushups 15 s on/ 15 s off                  PT Short Term Goals - 03/30/15 1758    PT SHORT TERM GOAL #1   Title have a good undetrstanding of his condition and his ability to exercise on own   Status Achieved           PT Long Term Goals - 03/30/15 1758    PT LONG TERM GOAL #1   Title independent with advanced HEP/gym program   Status Partially achieved   PT LONG TERM GOAL #2   Title report that he can climb stairs without taking a break   Status Achieved   PT LONG TERM GOAL #3   Title ambulate around  our building 2 laps without rest   Status partially achieved                Plan - 03/30/15 1756    Clinical Impression Statement Patient's MMT is 4+/5, however functional strength remains limited, unable to do a full squat with 20#, and unabl eto do a push-up, tolerance to exercise is much improved without drop in O2 saturation.   PT Next Visit Plan We have decreased treatment to 1x every other week with him doing some at home and after this I think 1x/month with him going to a gym would be appropriate to get him back to the gym safely   Consulted and Agree with Plan of Care Patient        Problem List Patient Active Problem List   Diagnosis Date Noted  . Chronic systolic heart failure ([place]) 01/27/2015  . Hyperlipidemia 01/27/2015  . Iron deficiency anemia 12/31/2014  . Acute on chronic systolic CHF (congestive heart failure) ([place])   . Uncontrolled type 2 diabetes mellitus with complication ([place])   . Demand ischemia ([place])   . NSTEMI (non-ST  elevated myocardial infarction) ([place])   . Pulmonary edema   . Toxic metabolic encephalopathy   . Hypernatremia   . Acute systolic CHF (congestive heart failure) ([place]) 12/21/2014  . NSVT (nonsustained ventricular tachycardia) ([place]) 12/20/2014  . Elevated troponin 12/20/2014  . Hypokalemia   . Acute renal failure ([place])   . Encounter for nasogastric (NG) tube placement   . Acute respiratory failure with hypoxia ([place])   . Pancreatitis, acute   . Encounter for intubation   . Encounter for orogastric (OG) tube placement   . Pancreatitis   . DKA (diabetic ketoacidoses) ([place]) 12/14/2014    PHYSICAL THERAPY DISCHARGE SUMMARY    Plan: Patient agrees to discharge.  Patient goals were partially met. Patient is being discharged due to not returning since the last visit.  ?????        Sumner Boast., PT 03/30/2015, 6:00 PM  [place], [place], [address] Phone: [phone]   Fax:  [phone]  Name: Brent Rasmussen MRN: [id number] Date of Birth: 10-11-1960

## 2015-04-14 ENCOUNTER — Ambulatory Visit: Payer: BLUE CROSS/BLUE SHIELD | Admitting: Physical Therapy

## 2015-04-27 ENCOUNTER — Ambulatory Visit: Payer: BLUE CROSS/BLUE SHIELD | Admitting: Physical Therapy

## 2016-08-16 IMAGING — CR DG ABDOMEN 1V
1 series · 1 of 1 positions shown · non-contrast
Comparison: 12/21/2014

CLINICAL DATA: Feeding tube placement

EXAM:
ABDOMEN - 1 VIEW

[AP]
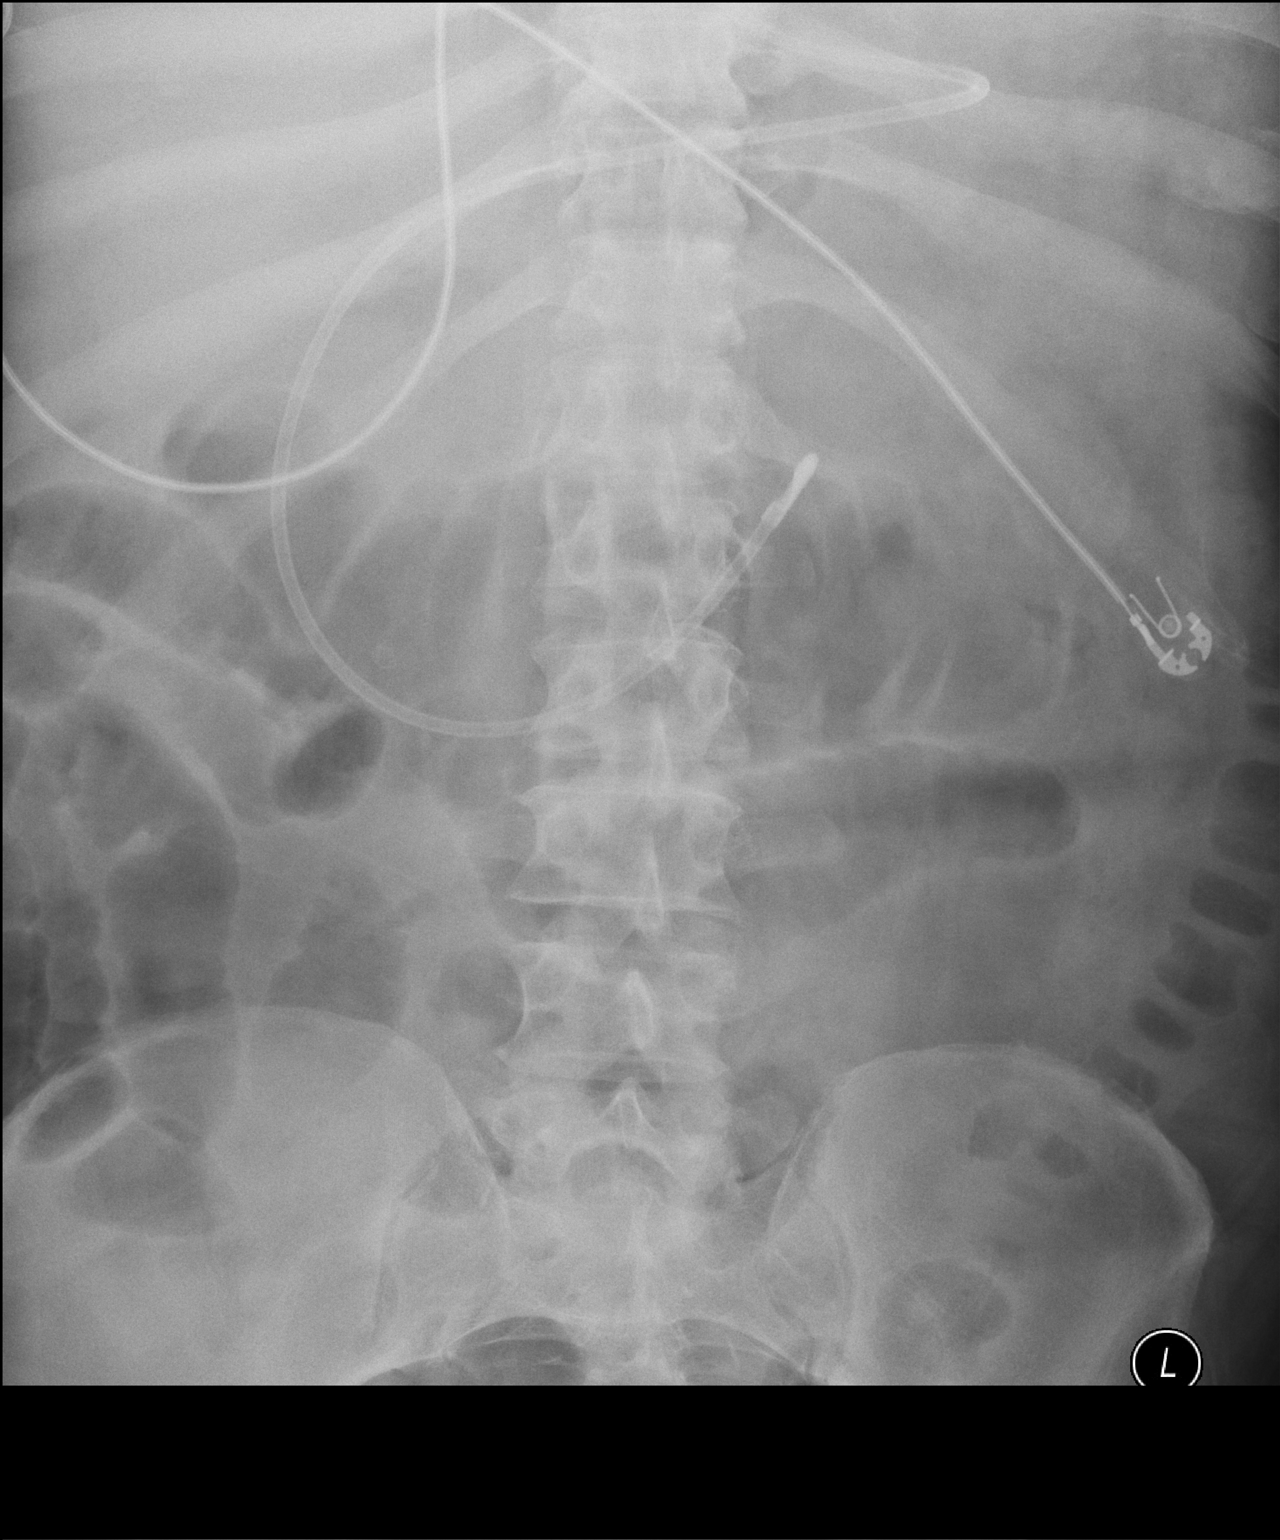

[1 of 1 positions shown; findings below may reference images not displayed]

FINDINGS: Feeding tube tip is in the distal duodenum as before. Mild gas
distention of small bowel with air present in the colon distally.
Monitor leads overlie the chest. No acute osseous finding or
abnormal calcification.
IMPRESSION: Feeding tube tip distal duodenum.

## 2016-08-18 IMAGING — CR DG ABD PORTABLE 1V
1 series · 1 of 1 positions shown · non-contrast
Comparison: Plain film of the abdomen dated 12/22/2014.

CLINICAL DATA: Feeding tube placement

EXAM:
PORTABLE ABDOMEN - 1 VIEW

[AP]
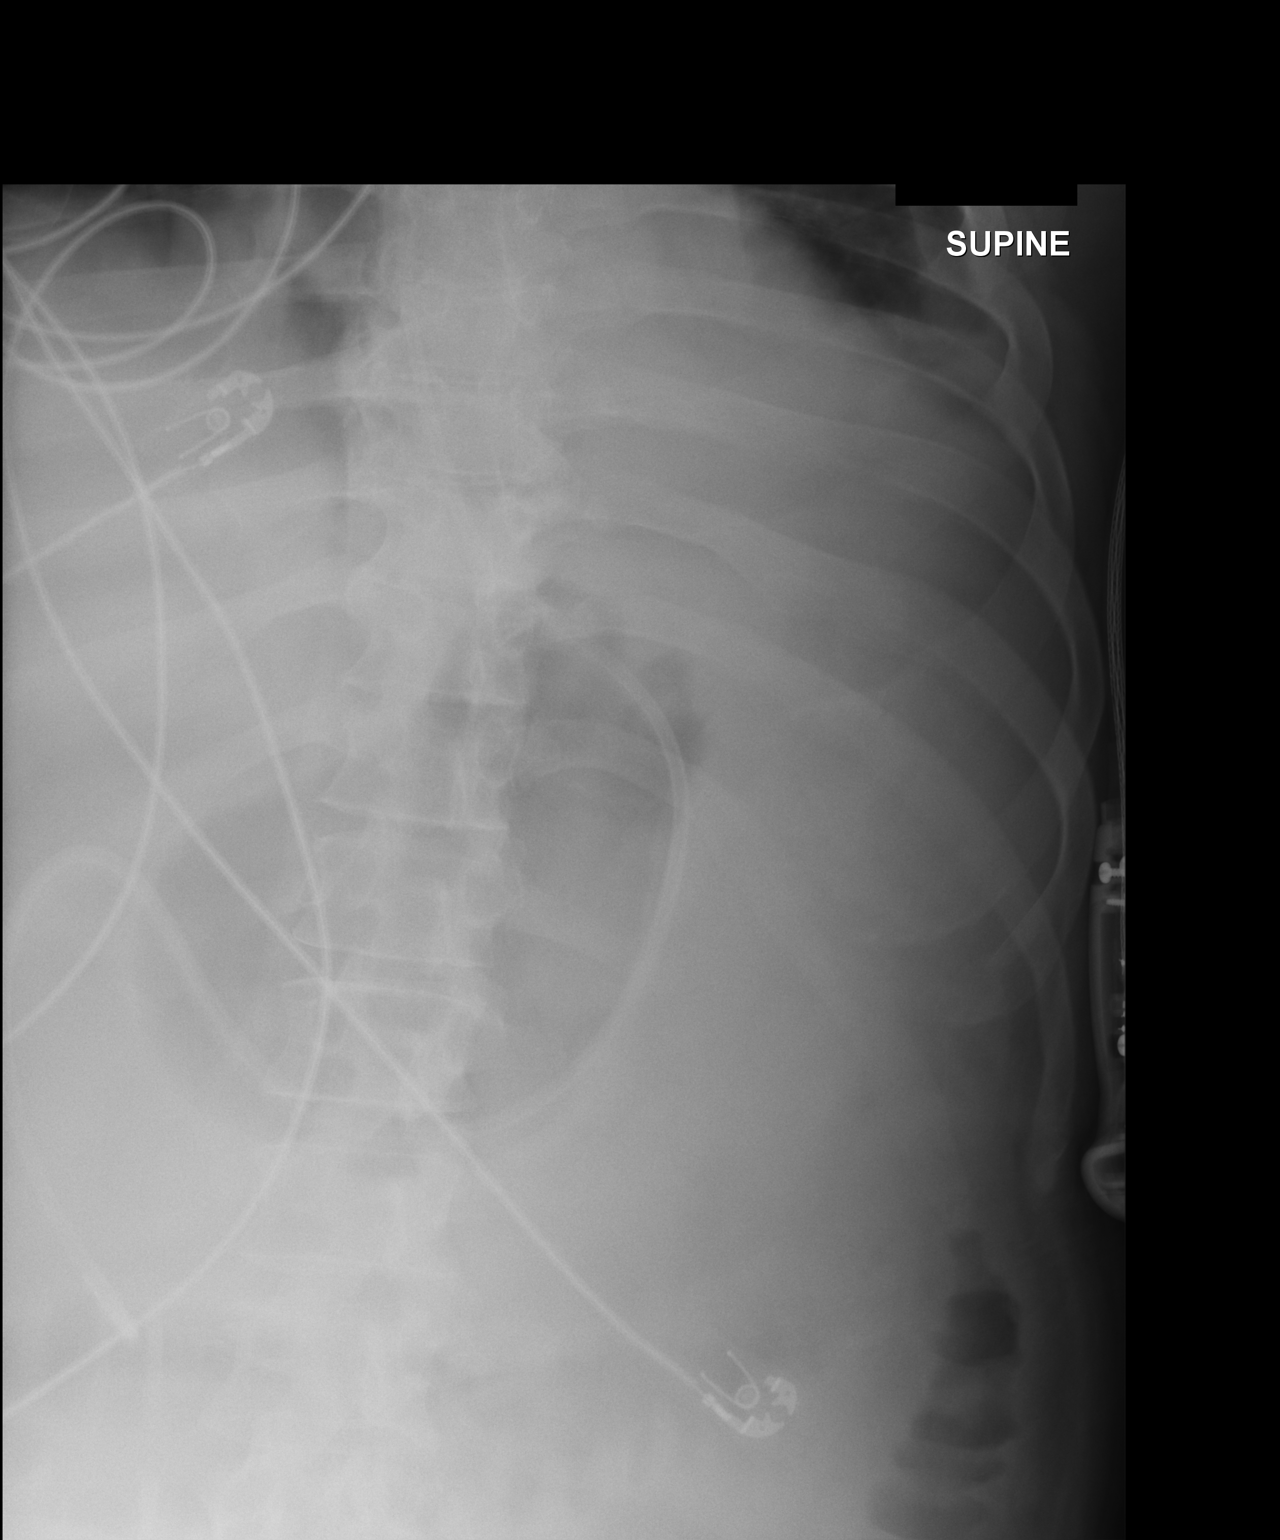

[1 of 1 positions shown; findings below may reference images not displayed]

FINDINGS: The tip of the feeding tube now appears to be in the descending
duodenum or proximal transverse segment, slightly retracted in
position compared to the earlier exam.
IMPRESSION: IMPRESSION
Feeding tube adequately positioned with tip in the distal portion of
the descending duodenum or proximal transverse segment, previously
within the distal duodenum.

## 2016-10-17 ENCOUNTER — Encounter (HOSPITAL_BASED_OUTPATIENT_CLINIC_OR_DEPARTMENT_OTHER): Payer: Self-pay

## 2016-10-17 ENCOUNTER — Emergency Department (HOSPITAL_BASED_OUTPATIENT_CLINIC_OR_DEPARTMENT_OTHER)
Admission: EM | Admit: 2016-10-17 | Discharge: 2016-10-18 | Disposition: A | Discharge status: Home / Self Care | Payer: BLUE CROSS/BLUE SHIELD | Attending: Emergency Medicine | Admitting: Emergency Medicine

## 2016-10-17 ENCOUNTER — Emergency Department (HOSPITAL_BASED_OUTPATIENT_CLINIC_OR_DEPARTMENT_OTHER): Payer: BLUE CROSS/BLUE SHIELD

## 2016-10-17 DIAGNOSIS — E1110 Type 2 diabetes mellitus with ketoacidosis without coma: Secondary | ICD-10-CM | POA: Insufficient documentation

## 2016-10-17 DIAGNOSIS — J45909 Unspecified asthma, uncomplicated: Secondary | ICD-10-CM | POA: Insufficient documentation

## 2016-10-17 DIAGNOSIS — Z79899 Other long term (current) drug therapy: Secondary | ICD-10-CM | POA: Insufficient documentation

## 2016-10-17 DIAGNOSIS — W228XXA Striking against or struck by other objects, initial encounter: Secondary | ICD-10-CM | POA: Insufficient documentation

## 2016-10-17 DIAGNOSIS — I509 Heart failure, unspecified: Secondary | ICD-10-CM | POA: Diagnosis not present

## 2016-10-17 DIAGNOSIS — Y9389 Activity, other specified: Secondary | ICD-10-CM | POA: Insufficient documentation

## 2016-10-17 DIAGNOSIS — G510 Bell's palsy: Secondary | ICD-10-CM | POA: Diagnosis not present

## 2016-10-17 DIAGNOSIS — Y999 Unspecified external cause status: Secondary | ICD-10-CM | POA: Insufficient documentation

## 2016-10-17 DIAGNOSIS — Z23 Encounter for immunization: Secondary | ICD-10-CM | POA: Insufficient documentation

## 2016-10-17 DIAGNOSIS — S62639B Displaced fracture of distal phalanx of unspecified finger, initial encounter for open fracture: Secondary | ICD-10-CM

## 2016-10-17 DIAGNOSIS — S61211A Laceration without foreign body of left index finger without damage to nail, initial encounter: Secondary | ICD-10-CM | POA: Diagnosis not present

## 2016-10-17 DIAGNOSIS — S62631B Displaced fracture of distal phalanx of left index finger, initial encounter for open fracture: Secondary | ICD-10-CM | POA: Diagnosis not present

## 2016-10-17 DIAGNOSIS — I2510 Atherosclerotic heart disease of native coronary artery without angina pectoris: Secondary | ICD-10-CM | POA: Diagnosis not present

## 2016-10-17 DIAGNOSIS — Y929 Unspecified place or not applicable: Secondary | ICD-10-CM | POA: Diagnosis not present

## 2016-10-17 DIAGNOSIS — S6992XA Unspecified injury of left wrist, hand and finger(s), initial encounter: Secondary | ICD-10-CM | POA: Diagnosis present

## 2016-10-17 HISTORY — DX: Heart failure, unspecified: I50.9

## 2016-10-17 MED ORDER — CEPHALEXIN 500 MG PO CAPS: 500.0000 mg | ORAL_CAPSULE | Freq: Two times a day (BID) | ORAL | 0 refills | Status: AC

## 2016-10-17 MED ORDER — LIDOCAINE HCL 2 % IJ SOLN
10.0000 mL | Freq: Once | INTRAMUSCULAR | Status: AC
  Administered 2016-10-17: 20 mg
  Filled 2016-10-17: qty 20

## 2016-10-17 MED ORDER — TETANUS-DIPHTH-ACELL PERTUSSIS 5-2.5-18.5 LF-MCG/0.5 IM SUSP
0.5000 mL | Freq: Once | INTRAMUSCULAR | Status: AC
  Administered 2016-10-17: 0.5 mL via INTRAMUSCULAR
  Filled 2016-10-17: qty 0.5

## 2016-10-17 MED ORDER — CEPHALEXIN 250 MG PO CAPS
500.0000 mg | ORAL_CAPSULE | Freq: Once | ORAL | Status: AC
  Administered 2016-10-17: 500 mg via ORAL
  Filled 2016-10-17: qty 2

## 2016-10-17 NOTE — ED Provider Notes (Signed)
[facility] Provider Note   CSN: [id number] Arrival date & time: 10/17/16  2043     History   Chief Complaint Chief Complaint  Patient presents with  . Finger Injury    HPI Brent Rasmussen is a 56 y.o. male.  HPI  56 year old male presents with a left index finger laceration. This occurred earlier this evening. Inks his last tetanus immunization was 5 or 6 years ago. He was helping clean a blender blade but did not realize that it was still plugged in. He actually press the start button and it spun around, lacerating his finger. Started on the pad of his finger but was very superficial and then wrapped around his finger. Does not involve his nail. He has no numbness or weakness in the finger. Pain is moderate.  Past Medical History:  Diagnosis Date  . Asthma   . Bell's palsy   . CHF (congestive heart failure) ([place])   . Coronary artery disease   . Diabetes mellitus without complication ([place])   . DKA (diabetic ketoacidoses) ([place])   . Pancreatitis     Patient Active Problem List   Diagnosis Date Noted  . Chronic systolic heart failure ([place]) 01/27/2015  . Hyperlipidemia 01/27/2015  . Iron deficiency anemia 12/31/2014  . Acute on chronic systolic CHF (congestive heart failure) ([place])   . Uncontrolled type 2 diabetes mellitus with complication ([place])   . Demand ischemia ([place])   . NSTEMI (non-ST elevated myocardial infarction) ([place])   . Pulmonary edema   . Toxic metabolic encephalopathy   . Hypernatremia   . Acute systolic CHF (congestive heart failure) ([place]) 12/21/2014  . NSVT (nonsustained ventricular tachycardia) ([place]) 12/20/2014  . Elevated troponin 12/20/2014  . Hypokalemia   . Acute renal failure ([place])   . Encounter for nasogastric (NG) tube placement   . Acute respiratory failure with hypoxia ([place])   . Pancreatitis, acute   . Encounter for intubation   . Encounter for orogastric (OG) tube placement   . Pancreatitis   . DKA (diabetic ketoacidoses)  ([place]) 12/14/2014    Past Surgical History:  Procedure Laterality Date  . CARDIAC CATHETERIZATION    . CARDIAC CATHETERIZATION N/A 12/30/2014   Procedure: Left Heart Cath and Coronary Angiography;  Surgeon: Lorretta Harp, MD;  Location: [facility];  Service: Cardiovascular;  Laterality: N/A;  . CORONARY ANGIOPLASTY WITH STENT PLACEMENT    . ROTATOR CUFF REPAIR    . TRIGGER FINGER RELEASE         Home Medications    Prior to Admission medications   Medication Sig Start Date End Date Taking? Authorizing Provider  atorvastatin (LIPITOR) 20 MG tablet Take 2 tablets (40 mg total) by mouth daily. 01/04/15   Allie Bossier, MD  blood glucose meter kit and supplies KIT Dispense based on patient and insurance preference. Use up to four times daily as directed. (FOR ICD-9 250.00, 250.01). 01/04/15   Allie Bossier, MD  carvedilol (COREG) 6.25 MG tablet Take 1 tablet (6.25 mg total) by mouth 2 (two) times daily with a meal. 01/04/15   Allie Bossier, MD  cephALEXin (KEFLEX) 500 MG capsule Take 1 capsule (500 mg total) by mouth 2 (two) times daily. 10/17/16 10/22/16  Sherwood Gambler, MD  desloratadine (CLARINEX) 5 MG tablet Take 5 mg by mouth daily. Take 1 tab daily as needed 11/04/13   [provider]  EPINEPHrine (EPIPEN 2-PAK) 0.3 mg/0.3 mL IJ SOAJ injection 1 each. Use as directed  09/11/12   [provider]  ferrous sulfate 325 (65 FE) MG tablet Take 325 mg by mouth daily. Take 1 tab daily 01/11/15 02/10/15  [provider]  glucose blood (ONE TOUCH ULTRA TEST) test strip 1 strip by Other route 4 (four) times daily as needed. Use 1 strip to check glucose 3-4 times a day 10/29/13   [provider]  hydrocortisone cream 0.5 % Apply topically 2 (two) times daily. 01/04/15   Allie Bossier, MD  insulin aspart (NOVOLOG FLEXPEN) 100 UNIT/ML FlexPen Inject 12 Units into the skin 3 (three) times daily with meals. 01/04/15   Allie Bossier, MD  Insulin  Glargine (LANTUS SOLOSTAR) 100 UNIT/ML Solostar Pen Inject 22 Units into the skin 2 (two) times daily. 01/04/15   Allie Bossier, MD  Insulin Pen Needle (AURORA PEN NEEDLES) 29G X 12MM MISC 12 Units by Does not apply route 3 (three) times daily before meals. 01/04/15   Allie Bossier, MD  levocetirizine (XYZAL) 5 MG tablet Take 5 mg by mouth at bedtime as needed for allergies.  12/01/14   [provider]  nitroGLYCERIN (NITROSTAT) 0.4 MG SL tablet Place 1 tablet (0.4 mg total) under the tongue every 5 (five) minutes as needed for chest pain. 01/27/15   Brett Canales, PA-C  pantoprazole (PROTONIX) 40 MG tablet Take 1 tablet (40 mg total) by mouth daily. 01/04/15   Allie Bossier, MD  polyethylene glycol [facility] / Floria Raveling) packet Take 17 g by mouth 2 (two) times daily as needed for moderate constipation. 01/04/15   Allie Bossier, MD  polyvinyl alcohol (LIQUIFILM TEARS) 1.4 % ophthalmic solution Place 1 drop into both eyes as needed for dry eyes. 01/04/15   Allie Bossier, MD  SYMBICORT 160-4.5 MCG/ACT inhaler Inhale 2 puffs into the lungs 2 (two) times daily as needed (shortness of breath). 01/04/15   Allie Bossier, MD    Family History No family history on file.  Social History Social History  Substance Use Topics  . Smoking status: Never Smoker  . Smokeless tobacco: Never Used  . Alcohol use Yes     Comment: occ     Allergies   Isotretinoin; Lisinopril; Other; Sulfa antibiotics; and Vicodin [hydrocodone-acetaminophen]   Review of Systems Review of Systems  Musculoskeletal: Positive for arthralgias.  Skin: Positive for wound.  Neurological: Negative for weakness and numbness.  All other systems reviewed and are negative.    Physical Exam Updated Vital Signs BP 112/68 (BP Location: Right Arm)   Pulse 60   Temp 99.1 F (37.3 C) (Oral)   Resp 18   Ht 5' 9"  (1.753 m)   Wt 110.7 kg (244 lb)   SpO2 98%   BMI 36.03 kg/m   Physical Exam  Constitutional: He  appears well-developed and well-nourished. No distress.  HENT:  Head: Normocephalic and atraumatic.  Eyes: Right eye exhibits no discharge. Left eye exhibits no discharge.  Pulmonary/Chest: Effort normal.  Musculoskeletal:       Left hand: He exhibits tenderness and laceration. Normal sensation noted. Normal strength noted.       Hands: Neurological: He is alert.  Skin: Skin is warm and dry. He is not diaphoretic.  Nursing note and vitals reviewed.    ED Treatments / Results  Labs (all labs ordered are listed, but only abnormal results are displayed) Labs Reviewed - No data to display  EKG  EKG Interpretation None       Radiology Dg Finger  Index Left  Result Date: 10/17/2016 CLINICAL DATA:  Deep laceration EXAM: LEFT INDEX FINGER 2+V COMPARISON:  None. FINDINGS: Soft tissue injury at the tip of the distal index finger. Slight cortical deformity of the dorsal distal phalanx/tuft. No subluxation or radiopaque foreign body IMPRESSION: Minimal deformity/suspected fracture of the dorsal tuft of the second distal phalanx with overlying soft tissue injury Electronically Signed   By: Donavan Foil M.D.   On: 10/17/2016 23:05    Procedures .Marland KitchenLaceration Repair Date/Time: 10/17/2016 11:38 PM Performed by: Sherwood Gambler Authorized by: Sherwood Gambler   Consent:    Consent obtained:  Verbal   Consent given by:  Patient   Risks discussed:  Infection, need for additional repair, pain, poor cosmetic result and poor wound healing Anesthesia (see MAR for exact dosages):    Anesthesia method:  Nerve block   Block location:  Digital finger   Block outcome:  Anesthesia achieved Laceration details:    Location:  Finger   Finger location:  L index finger   Length (cm):  1.5 Repair type:    Repair type:  Simple Pre-procedure details:    Preparation:  Imaging obtained to evaluate for foreign bodies and patient was prepped and draped in usual sterile fashion Exploration:     Contaminated: no   Treatment:    Area cleansed with:  Saline   Amount of cleaning:  Standard   Irrigation solution:  Sterile saline   Irrigation method:  Syringe Skin repair:    Repair method:  Sutures   Suture size:  4-0   Suture material:  Prolene   Number of sutures:  7 Approximation:    Approximation:  Close   Vermilion border: well-aligned   Post-procedure details:    Dressing:  Antibiotic ointment and splint for protection   Patient tolerance of procedure:  Tolerated well, no immediate complications .Nerve Block Date/Time: 10/17/2016 11:38 PM Performed by: Sherwood Gambler Authorized by: Sherwood Gambler   Consent:    Consent obtained:  Verbal   Consent given by:  Patient Indications:    Indications:  Procedural anesthesia Location:    Body area:  Upper extremity   Upper extremity nerve blocked: digital.   Laterality:  Left Pre-procedure details:    Skin preparation:  Alcohol   Preparation: Patient was prepped and draped in usual sterile fashion   Procedure details (see MAR for exact dosages):    Block needle gauge:  25 G   Anesthetic injected:  Lidocaine 2% w/o epi   Injection procedure:  Anatomic landmarks identified and negative aspiration for blood Post-procedure details:    Outcome:  Anesthesia achieved   Patient tolerance of procedure:  Tolerated well, no immediate complications   (including critical care time)  Medications Ordered in ED Medications  Tdap (BOOSTRIX) injection 0.5 mL (0.5 mLs Intramuscular Given 10/17/16 2247)  lidocaine (XYLOCAINE) 2 % (with pres) injection 200 mg (20 mg Other Given 10/17/16 2247)  cephALEXin (KEFLEX) capsule 500 mg (500 mg Oral Given 10/17/16 2325)     Initial Impression / Assessment and Plan / ED Course  I have reviewed the triage vital signs and the nursing notes.  Pertinent labs & imaging results that were available during my care of the patient were reviewed by me and considered in my medical decision making (see  chart for details).     Patient has some mild numbness to his fingertip but good capillary refill and color. There is a possible tuft fracture and while this is not overlying the laceration  he does have a small superficial wound over his distal palmar tip. Thus this could be real and I will treat him with Keflex and he has been updated on his tetanus. Laceration repaired as above. Follow up with hand surgery in the next 2 days. Normal range of motion of his finger and no signs of a tendon injury. Discharge home with return precautions.  Final Clinical Impressions(s) / ED Diagnoses   Final diagnoses:  Laceration of left index finger without foreign body without damage to nail, initial encounter  Open fracture of tuft of distal phalanx of finger    New Prescriptions New Prescriptions   CEPHALEXIN (KEFLEX) 500 MG CAPSULE    Take 1 capsule (500 mg total) by mouth 2 (two) times daily.     Sherwood Gambler, MD 10/17/16 [id number]

## 2016-10-17 NOTE — ED Triage Notes (Signed)
Pt cut left index finger with blender blade approx 8p-lac noted-bleeding controlled-NAD-steady gait

## 2016-10-17 NOTE — ED Notes (Signed)
ED Provider at bedside. 

## 2018-06-12 IMAGING — DX DG FINGER INDEX 2+V*L*
3 series · 3 of 3 positions shown · non-contrast
Comparison: None.

CLINICAL DATA: Deep laceration

EXAM:
LEFT INDEX FINGER 2+V

[finger ap]
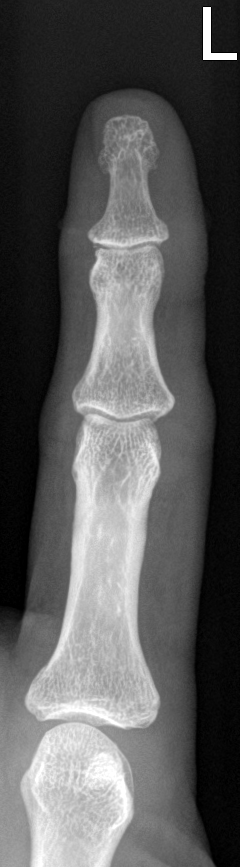

[finger obl]
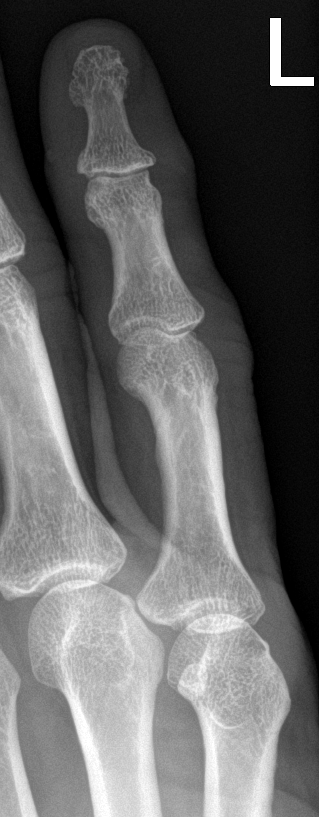

[finger lat]
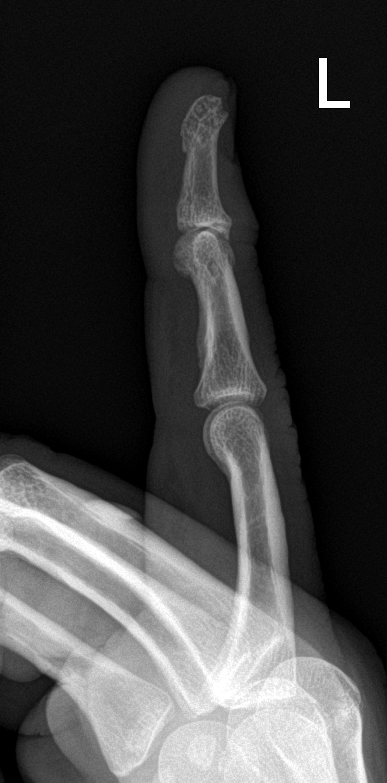

[3 of 3 positions shown; findings below may reference images not displayed]

FINDINGS: Soft tissue injury at the tip of the distal index finger. Slight
cortical deformity of the dorsal distal phalanx/tuft. No subluxation
or radiopaque foreign body
IMPRESSION: Minimal deformity/suspected fracture of the dorsal tuft of the
second distal phalanx with overlying soft tissue injury
